# Patient Record
Sex: Male | Born: 1955
Health system: Southern US, Community
[De-identification: ages and names within clinical notes are randomized; demographics above are authoritative.]

## PROBLEM LIST (undated history)

## (undated) DIAGNOSIS — M1990 Unspecified osteoarthritis, unspecified site: Secondary | ICD-10-CM

## (undated) DIAGNOSIS — E785 Hyperlipidemia, unspecified: Secondary | ICD-10-CM

## (undated) DIAGNOSIS — I10 Essential (primary) hypertension: Secondary | ICD-10-CM

## (undated) HISTORY — DX: Hyperlipidemia, unspecified: E78.5

## (undated) HISTORY — PX: APPENDECTOMY: SHX54

## (undated) HISTORY — DX: Unspecified osteoarthritis, unspecified site: M19.90

## (undated) HISTORY — DX: Essential (primary) hypertension: I10

## (undated) HISTORY — PX: LUNG SURGERY: SHX703

## (undated) HISTORY — PX: CERVICAL DISC SURGERY: SHX588

---

## 2000-01-20 HISTORY — PX: SPINE SURGERY: SHX786

## 2004-03-18 ENCOUNTER — Ambulatory Visit (HOSPITAL_COMMUNITY): Admission: RE | Admit: 2004-03-18 | Discharge: 2004-03-18 | Payer: Self-pay | Admitting: Family Medicine

## 2004-04-02 ENCOUNTER — Ambulatory Visit (HOSPITAL_COMMUNITY): Admission: RE | Admit: 2004-04-02 | Discharge: 2004-04-03 | Payer: Self-pay | Admitting: Neurosurgery

## 2006-01-19 DIAGNOSIS — I10 Essential (primary) hypertension: Secondary | ICD-10-CM

## 2006-01-19 HISTORY — DX: Essential (primary) hypertension: I10

## 2007-04-06 ENCOUNTER — Inpatient Hospital Stay (HOSPITAL_COMMUNITY): Admission: RE | Admit: 2007-04-06 | Discharge: 2007-04-08 | Payer: Self-pay | Admitting: Neurosurgery

## 2007-11-28 ENCOUNTER — Encounter: Payer: Self-pay | Admitting: Physician Assistant

## 2008-02-07 ENCOUNTER — Inpatient Hospital Stay (HOSPITAL_COMMUNITY): Admission: RE | Admit: 2008-02-07 | Discharge: 2008-02-11 | Payer: Self-pay | Admitting: Orthopedic Surgery

## 2008-08-02 ENCOUNTER — Encounter: Payer: Self-pay | Admitting: Physician Assistant

## 2009-03-26 ENCOUNTER — Encounter: Payer: Self-pay | Admitting: Physician Assistant

## 2009-05-01 ENCOUNTER — Encounter: Admission: RE | Admit: 2009-05-01 | Discharge: 2009-05-07 | Payer: Self-pay | Admitting: Anesthesiology

## 2009-05-07 ENCOUNTER — Ambulatory Visit: Payer: Self-pay | Admitting: Anesthesiology

## 2009-08-14 ENCOUNTER — Ambulatory Visit: Payer: Self-pay | Admitting: Family Medicine

## 2009-08-14 DIAGNOSIS — M5417 Radiculopathy, lumbosacral region: Secondary | ICD-10-CM | POA: Insufficient documentation

## 2009-08-14 DIAGNOSIS — M549 Dorsalgia, unspecified: Secondary | ICD-10-CM

## 2009-08-14 DIAGNOSIS — E1165 Type 2 diabetes mellitus with hyperglycemia: Secondary | ICD-10-CM | POA: Insufficient documentation

## 2009-08-14 DIAGNOSIS — E119 Type 2 diabetes mellitus without complications: Secondary | ICD-10-CM

## 2009-08-14 DIAGNOSIS — E6609 Other obesity due to excess calories: Secondary | ICD-10-CM | POA: Insufficient documentation

## 2009-08-14 DIAGNOSIS — L259 Unspecified contact dermatitis, unspecified cause: Secondary | ICD-10-CM | POA: Insufficient documentation

## 2009-08-15 ENCOUNTER — Encounter: Payer: Self-pay | Admitting: Physician Assistant

## 2009-11-13 ENCOUNTER — Encounter: Payer: Self-pay | Admitting: Physician Assistant

## 2009-11-22 ENCOUNTER — Telehealth: Payer: Self-pay | Admitting: Family Medicine

## 2009-12-02 ENCOUNTER — Ambulatory Visit: Payer: Self-pay | Admitting: Family Medicine

## 2009-12-20 ENCOUNTER — Encounter: Payer: Self-pay | Admitting: Family Medicine

## 2009-12-27 ENCOUNTER — Telehealth: Payer: Self-pay | Admitting: Family Medicine

## 2010-02-16 LAB — CONVERTED CEMR LAB
Creatinine, Urine: 254.2 mg/dL
Microalb Creat Ratio: 11.1 mg/g (ref 0.0–30.0)
Microalb, Ur: 2.83 mg/dL — ABNORMAL HIGH (ref 0.00–1.89)

## 2010-02-18 NOTE — Progress Notes (Signed)
  Phone Note Other Incoming   Caller: dr simpson Summary of Call: no psa was done on this pt that i can find,pls check if none pls reorder and let him know, if he has on pls print andput in my box Initial call taken by: Syliva Overman MD,  December 27, 2009 6:28 AM  Follow-up for Phone Call        labs to be faxed Follow-up by: Adella Hare LPN,  December 27, 2009 3:13 PM
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
## 2010-02-18 NOTE — Progress Notes (Signed)
Summary: MEDICINE  Phone Note Call from Patient   Summary of Call: NEEDS HIS METFORMIN REFILLED [facility] EDEN HAS APPT. 11.14.11  Initial call taken by: Lind Guest,  November 22, 2009 11:17 AM  Follow-up for Phone Call        pls refill x 1 only and let him know Follow-up by: Syliva Overman MD,  November 22, 2009 12:27 PM    Prescriptions: METFORMIN HCL 1000 MG TABS (METFORMIN HCL) take 1 two times a day  #60 x 0   Entered by:   Adella Hare LPN   Authorized by:   Syliva Overman MD   Signed by:   Adella Hare LPN on 16/10/9602   Method used:   Electronically to        [facility] (retail)       [address]       [place], [place]  [address]       Ph: [phone]       Fax: [phone]   RxID:   [id number]

## 2010-02-18 NOTE — Letter (Signed)
Summary: AUTHORIZATION FOR MEDICAL RELEASE  AUTHORIZATION FOR MEDICAL RELEASE   Imported By: Lind Guest 08/15/2009 16:07:43  _____________________________________________________________________  External Attachment:    Type:   Image     Comment:   External Document

## 2010-02-18 NOTE — Letter (Signed)
Summary: ADD ON LAB  ADD ON LAB   Imported By: Lind Guest 12/20/2009 08:28:29  _____________________________________________________________________  External Attachment:    Type:   Image     Comment:   External Document
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
## 2010-02-21 NOTE — Letter (Signed)
Summary: no show / gastroenterolgy  no show / gastroenterolgy   Imported By: Lind Guest 11/13/2009 12:44:37  _____________________________________________________________________  External Attachment:    Type:   Image     Comment:   External Document

## 2010-04-02 ENCOUNTER — Ambulatory Visit: Payer: Self-pay | Admitting: Family Medicine

## 2010-04-02 ENCOUNTER — Encounter: Payer: Self-pay | Admitting: Family Medicine

## 2010-04-08 NOTE — Letter (Signed)
Summary: 1st no show  1st no show   Imported By: Lind Guest 04/03/2010 08:57:14  _____________________________________________________________________  External Attachment:    Type:   Image     Comment:   External Document

## 2010-05-01 ENCOUNTER — Other Ambulatory Visit: Payer: Self-pay | Admitting: Family Medicine

## 2010-05-05 LAB — BASIC METABOLIC PANEL
BUN: 10 mg/dL (ref 6–23)
BUN: 13 mg/dL (ref 6–23)
BUN: 5 mg/dL — ABNORMAL LOW (ref 6–23)
BUN: 6 mg/dL (ref 6–23)
CO2: 29 mEq/L (ref 19–32)
CO2: 29 mEq/L (ref 19–32)
CO2: 30 mEq/L (ref 19–32)
CO2: 32 mEq/L (ref 19–32)
Calcium: 7.8 mg/dL — ABNORMAL LOW (ref 8.4–10.5)
Calcium: 7.9 mg/dL — ABNORMAL LOW (ref 8.4–10.5)
Calcium: 8 mg/dL — ABNORMAL LOW (ref 8.4–10.5)
Calcium: 8.2 mg/dL — ABNORMAL LOW (ref 8.4–10.5)
Chloride: 101 mEq/L (ref 96–112)
Chloride: 101 mEq/L (ref 96–112)
Chloride: 103 mEq/L (ref 96–112)
Chloride: 97 mEq/L (ref 96–112)
Creatinine, Ser: 0.69 mg/dL (ref 0.4–1.5)
Creatinine, Ser: 0.7 mg/dL (ref 0.4–1.5)
Creatinine, Ser: 0.77 mg/dL (ref 0.4–1.5)
Creatinine, Ser: 0.77 mg/dL (ref 0.4–1.5)
GFR calc Af Amer: >60 mL/min (ref >60)
GFR calc Af Amer: >60 mL/min (ref >60)
GFR calc Af Amer: >60 mL/min (ref >60)
GFR calc Af Amer: >60 mL/min (ref >60)
GFR calc non Af Amer: >60 mL/min (ref >60)
GFR calc non Af Amer: >60 mL/min (ref >60)
GFR calc non Af Amer: >60 mL/min (ref >60)
GFR calc non Af Amer: >60 mL/min (ref >60)
Glucose, Bld: 130 mg/dL — ABNORMAL HIGH (ref 70–99)
Glucose, Bld: 136 mg/dL — ABNORMAL HIGH (ref 70–99)
Glucose, Bld: 154 mg/dL — ABNORMAL HIGH (ref 70–99)
Glucose, Bld: 204 mg/dL — ABNORMAL HIGH (ref 70–99)
Potassium: 3.3 mEq/L — ABNORMAL LOW (ref 3.5–5.1)
Potassium: 3.4 mEq/L — ABNORMAL LOW (ref 3.5–5.1)
Potassium: 3.4 mEq/L — ABNORMAL LOW (ref 3.5–5.1)
Potassium: 3.8 mEq/L (ref 3.5–5.1)
Sodium: 134 mEq/L — ABNORMAL LOW (ref 135–145)
Sodium: 137 mEq/L (ref 135–145)
Sodium: 137 mEq/L (ref 135–145)
Sodium: 138 mEq/L (ref 135–145)

## 2010-05-05 LAB — DIFFERENTIAL
Basophils Absolute: 0 10*3/uL (ref 0.0–0.1)
Basophils Relative: 0 % (ref 0–1)
Eosinophils Absolute: 0.1 10*3/uL (ref 0.0–0.7)
Eosinophils Relative: 1 % (ref 0–5)
Lymphocytes Relative: 16 % (ref 12–46)
Lymphs Abs: 1.3 10*3/uL (ref 0.7–4.0)
Monocytes Absolute: 0.4 10*3/uL (ref 0.1–1.0)
Monocytes Relative: 6 % (ref 3–12)
Neutro Abs: 6.3 10*3/uL (ref 1.7–7.7)
Neutrophils Relative %: 77 % (ref 43–77)

## 2010-05-05 LAB — GLUCOSE, CAPILLARY
Glucose-Capillary: 129 mg/dL — ABNORMAL HIGH (ref 70–99)
Glucose-Capillary: 137 mg/dL — ABNORMAL HIGH (ref 70–99)
Glucose-Capillary: 138 mg/dL — ABNORMAL HIGH (ref 70–99)
Glucose-Capillary: 143 mg/dL — ABNORMAL HIGH (ref 70–99)
Glucose-Capillary: 150 mg/dL — ABNORMAL HIGH (ref 70–99)
Glucose-Capillary: 151 mg/dL — ABNORMAL HIGH (ref 70–99)
Glucose-Capillary: 152 mg/dL — ABNORMAL HIGH (ref 70–99)
Glucose-Capillary: 176 mg/dL — ABNORMAL HIGH (ref 70–99)
Glucose-Capillary: 177 mg/dL — ABNORMAL HIGH (ref 70–99)
Glucose-Capillary: 178 mg/dL — ABNORMAL HIGH (ref 70–99)
Glucose-Capillary: 187 mg/dL — ABNORMAL HIGH (ref 70–99)
Glucose-Capillary: 188 mg/dL — ABNORMAL HIGH (ref 70–99)
Glucose-Capillary: 196 mg/dL — ABNORMAL HIGH (ref 70–99)
Glucose-Capillary: 199 mg/dL — ABNORMAL HIGH (ref 70–99)
Glucose-Capillary: 205 mg/dL — ABNORMAL HIGH (ref 70–99)
Glucose-Capillary: 213 mg/dL — ABNORMAL HIGH (ref 70–99)
Glucose-Capillary: 223 mg/dL — ABNORMAL HIGH (ref 70–99)
Glucose-Capillary: 286 mg/dL — ABNORMAL HIGH (ref 70–99)

## 2010-05-05 LAB — COMPREHENSIVE METABOLIC PANEL
ALT: 34 U/L (ref 0–53)
AST: 31 U/L (ref 0–37)
Albumin: 4.2 g/dL (ref 3.5–5.2)
Alkaline Phosphatase: 66 U/L (ref 39–117)
BUN: 12 mg/dL (ref 6–23)
CO2: 28 mEq/L (ref 19–32)
Calcium: 9.8 mg/dL (ref 8.4–10.5)
Chloride: 103 mEq/L (ref 96–112)
Creatinine, Ser: 0.9 mg/dL (ref 0.4–1.5)
GFR calc Af Amer: >60 mL/min (ref >60)
GFR calc non Af Amer: >60 mL/min (ref >60)
Glucose, Bld: 237 mg/dL — ABNORMAL HIGH (ref 70–99)
Potassium: 4.3 mEq/L (ref 3.5–5.1)
Sodium: 139 mEq/L (ref 135–145)
Total Bilirubin: 0.8 mg/dL (ref 0.3–1.2)
Total Protein: 7.4 g/dL (ref 6.0–8.3)

## 2010-05-05 LAB — CBC
HCT: 47.6 % (ref 39.0–52.0)
HCT: 28 % — ABNORMAL LOW (ref 39.0–52.0)
HCT: 29.7 % — ABNORMAL LOW (ref 39.0–52.0)
HCT: 30.8 % — ABNORMAL LOW (ref 39.0–52.0)
HCT: 34.2 % — ABNORMAL LOW (ref 39.0–52.0)
Hemoglobin: 16.1 g/dL (ref 13.0–17.0)
Hemoglobin: 10.1 g/dL — ABNORMAL LOW (ref 13.0–17.0)
Hemoglobin: 10.5 g/dL — ABNORMAL LOW (ref 13.0–17.0)
Hemoglobin: 11.6 g/dL — ABNORMAL LOW (ref 13.0–17.0)
Hemoglobin: 9.4 g/dL — ABNORMAL LOW (ref 13.0–17.0)
MCHC: 33.9 g/dL (ref 30.0–36.0)
MCHC: 33.7 g/dL (ref 30.0–36.0)
MCHC: 34 g/dL (ref 30.0–36.0)
MCHC: 34 g/dL (ref 30.0–36.0)
MCHC: 34 g/dL (ref 30.0–36.0)
MCV: 89 fL (ref 78.0–100.0)
MCV: 89.3 fL (ref 78.0–100.0)
MCV: 90.1 fL (ref 78.0–100.0)
MCV: 90.1 fL (ref 78.0–100.0)
MCV: 90.5 fL (ref 78.0–100.0)
Platelets: 200 10*3/uL (ref 150–400)
Platelets: 121 10*3/uL — ABNORMAL LOW (ref 150–400)
Platelets: 127 10*3/uL — ABNORMAL LOW (ref 150–400)
Platelets: 155 10*3/uL (ref 150–400)
Platelets: 168 10*3/uL (ref 150–400)
RBC: 5.35 MIL/uL (ref 4.22–5.81)
RBC: 3.09 MIL/uL — ABNORMAL LOW (ref 4.22–5.81)
RBC: 3.3 MIL/uL — ABNORMAL LOW (ref 4.22–5.81)
RBC: 3.42 MIL/uL — ABNORMAL LOW (ref 4.22–5.81)
RBC: 3.83 MIL/uL — ABNORMAL LOW (ref 4.22–5.81)
RDW: 13.4 % (ref 11.5–15.5)
RDW: 12.8 % (ref 11.5–15.5)
RDW: 13.3 % (ref 11.5–15.5)
RDW: 13.3 % (ref 11.5–15.5)
RDW: 13.4 % (ref 11.5–15.5)
WBC: 8.2 10*3/uL (ref 4.0–10.5)
WBC: 12.6 10*3/uL — ABNORMAL HIGH (ref 4.0–10.5)
WBC: 13.8 10*3/uL — ABNORMAL HIGH (ref 4.0–10.5)
WBC: 7.7 10*3/uL (ref 4.0–10.5)
WBC: 8 10*3/uL (ref 4.0–10.5)

## 2010-05-05 LAB — CULTURE, RESPIRATORY W GRAM STAIN: Culture: NORMAL

## 2010-05-05 LAB — TYPE AND SCREEN
ABO/RH(D): A POS
Antibody Screen: NEGATIVE

## 2010-05-05 LAB — URINALYSIS, ROUTINE W REFLEX MICROSCOPIC
Bilirubin Urine: NEGATIVE
Glucose, UA: >1000 mg/dL — AB
Hgb urine dipstick: NEGATIVE
Ketones, ur: NEGATIVE mg/dL
Leukocytes, UA: NEGATIVE
Nitrite: NEGATIVE
Protein, ur: NEGATIVE mg/dL
Specific Gravity, Urine: 1.03 (ref 1.005–1.030)
Urobilinogen, UA: 1 mg/dL (ref 0.0–1.0)
pH: 5.5 (ref 5.0–8.0)

## 2010-05-05 LAB — URINE MICROSCOPIC-ADD ON

## 2010-05-05 LAB — POCT I-STAT GLUCOSE
Glucose, Bld: 189 mg/dL — ABNORMAL HIGH (ref 70–99)
Glucose, Bld: 190 mg/dL — ABNORMAL HIGH (ref 70–99)
Operator id: 139621
Operator id: 139621

## 2010-05-05 LAB — PROTIME-INR
INR: 1 (ref 0.00–1.49)
Prothrombin Time: 13.8 seconds (ref 11.6–15.2)

## 2010-05-05 LAB — APTT: aPTT: 31 seconds (ref 24–37)

## 2010-05-05 LAB — VITAMIN D 25 HYDROXY (VIT D DEFICIENCY, FRACTURES): Vit D, 25-Hydroxy: 13 ng/mL — ABNORMAL LOW (ref 30–89)

## 2010-05-05 LAB — EXPECTORATED SPUTUM ASSESSMENT W GRAM STAIN, RFLX TO RESP C

## 2010-05-22 ENCOUNTER — Other Ambulatory Visit: Payer: Self-pay | Admitting: Family Medicine

## 2010-06-03 NOTE — Op Note (Signed)
NAME:  Bryan Wright, Bryan Wright NO.:  [id number]   MEDICAL RECORD NO.:  [id number]          PATIENT TYPE:  INP   LOCATION:  5012                         FACILITY:  [facility]   PHYSICIAN:  Nelda Severe, MD      DATE OF BIRTH:  1955-09-20   DATE OF PROCEDURE:  02/07/2008  DATE OF DISCHARGE:                               OPERATIVE REPORT   SURGEON:  Nelda Severe, MD   ASSISTANT:  Lianne Cure, PA-C   PREOPERATIVE DIAGNOSES:  Lumbar spondylosis, L3-4, L4-5, and L5-S1;  status post bilateral L4-5 laminectomy with mild lateral stenosis, L3-4,  L4-5, and L5-S1; right sciatic pain.   POSTOPERATIVE DIAGNOSES:  Lumbar spondylosis, L3-4, L4-5, and L5-S1;  status post bilateral L4-5 laminectomy with mild lateral stenosis, L3-4,  L4-5, and L5-S1; right sciatic pain.   OPERATIVE PROCEDURE:  Revision laminectomy, right side at L4-5, L5-S1  laminotomy and foraminotomy and partial facet joint resection, right  side; bilateral posterolateral fusion, L3-4, L4-5, and L5-S1 with local  autograft and beta-tricalcium phosphate plus bone marrow aspirate and  left posterior fusion, L3-4, L4-5, and L5-S1 facet joints with local  autograft, beta-tricalcium phosphate, and bone marrow aspirate, left L3-  4 facet fusion; instrumentation with pedicle screws and rods, L3-4, L4-  5, and L5-S1 bilaterally ([place] Spine); aspiration bone marrow from right  posterior iliac crest.   OPERATIVE NOTE:  The patient was placed under general endotracheal  anesthesia.  A Foley catheter was placed in the bladder.  Intravenous  antibiotics were infused prophylactically.  Sequential compression  devices were placed on both lower extremities.  The patient was  positioned prone on a Jackson frame.  Care was taken to position the  upper extremities so as to avoid hyperflexion and abduction of the  shoulders so as to avoid hyperflexion of the elbows.  Both upper  extremities were padded with foam from axilla to hands.   The thighs,  knees, shins, and ankles were supported on pillows.  Hair was clipped  from the lumbar spine.  The previous midline incision was marked with a  skin marker.  Lumbar area was prepped using DuraPrep and draped in a  rectangular fashion.  The drapes were secured with Ioban.   The skin was scored in an elliptical fashion around the previous  incision and a vertical incision created proximally and distally to the  previous scar.  Subcutaneous tissue and paraspinal muscles and fascia  were injected with a mixture of 0.25% plain Marcaine and 1% lidocaine  with epinephrine.  Dissection was then carried down with the cutting  current and the previous scar excised.  There was a moderately thick  adipose layer.  The spinous process of the distal lumbosacral spine was  identified, as was the previously resected L4 spinous process.  A cross-  table lateral radiograph was taken with a Kocher clamp on the spinous  process of L3 and L5, hence confirming our impression of last mobile  segment at L5-S1.  We then continued exposure to reflect paraspinal  muscles bilaterally to expose the tips of the L3, L4, and L5 transverse  processes  as well as the ala of the sacrum bilaterally.   At this point, we placed pedicle screw holes, first on the left at L5  through L3 and then on the right at L5 through L3.  This was done the  same manner at each level, identifying the base of the superior  articular process, removing a small amount with a Leksell rongeur,  identifying the posterior pedicle, perforating the pedicle with an awl,  using a pedicle probe to create a hole through the pedicle into the  vertebral body, sounding the hole with a ball-tip probe to make sure it  was circumferentially intact and recording the depths.  Each hole was  injected with FloSeal and then a radiopaque marker placed.  Subsequent  cross-table lateral radiograph revealed satisfactory position of  markers.   We then set  about harvesting local bone graft.  A medium gouge was used  to remove shavings of bone from the lamina and particularly inferior  articular processes at L3, L4, and L5 bilaterally.  This revealed a  reasonable amount of graft which was morcellized by the OR staff.  An 18-  gauge needle was placed in the right posterior iliac crest and  approximately 15 mL of bone marrow aspirated and mixed with 10 mL of  beta-tricalcium phosphate and the autograft which had been harvested.  We then began the process of revision laminectomy on the right side at  L4-5.  The trailing edge of the right L4 lamina was curetted free of  laminectomy membrane.  The lamina had already been thinned out by the  autograft harvesting process.  We used Kerrison rongeurs to remove the  remaining inferior articular process as well as most of the lamina of L4  proximally.  We then used a Kerrison rongeur to remove the remaining  superior articular process of L5.  This adequately decompressed the  neural foramen.  I was able to easily palpate the L4 nerve root exiting  and to identify it, although I did not dissected free.  There appeared  to be some lateral stenosis medial to the L5 pedicle impinging the L5  nerve root.  We then proceeded to do a lateral recess decompression from  proximally to a point about mid pedicle.  I then performed a laminotomy  at L5-S1 on the right side.  This was extended laterally to include a  partial facetectomy and to be able to visualize and palpate the L5 nerve  root exiting in the neural foramen.  It could be palpated, free from  pressure medial to the pedicle.  A very small bridge of L5 lamina  remained on the right side between the revision laminectomy at L4-5  above, and the laminotomy and foraminotomy at L5-S1 below.   Next, we decorticated the ala of sacrum, transverse processes, and  lateral aspect of the superior articular processes in the area to be  fused on the right, L3 through  S1.  The graft/beta-tricalcium  phosphate/bone marrow aspirate mixture was placed posterolaterally being  careful to press it into the areas that had been decorticated digitally.  We then placed screws starting distally at S1 and working proximally to  L3.  The S1 pedicle screw was 7.2 mm in diameter and the other screw 6.5  mm in diameter.  Screw lengths were checked at the time of insertion by  inserting a ball-tip probe and measuring the depth again.  We then  contoured the titanium rod and provisionally coupled it from  S1 through  L3 on the right side.   On the left side, we proceeded to do exactly the same thing,  decorticating the ala of the sacrum, the transverse processes, and  lateral aspects of the superior articular processes of the vertebrae to  be fused.  More graft was placed posterolaterally.  Screws were then  placed and checked for length in the same fashion as described for the  right side.  A titanium rod was contoured and provisionally fixed.  Cross-table lateral radiograph showed satisfactory position of the  screws and rods.  We then compressed the constructs segmentally and  torqued all the screws.  I then used a high-speed bur to further resect  the facet joints on the left side at L3-4, L4-5, and L5-S1.  More graft  was packed into the facet joints.  The right-sided L3-4 facet joint was  also further decorticated and graft packed.   On two occasions during the procedure while awaiting for x-rays,  antibiotic solution was placed in the wound and allowed to sit for  several minutes while the x-ray was being taken to process.   We placed a 15-gauge Blake drain subfascially and brought out through  the skin to the right side and secured with a basket weave nylon suture.  The thoracolumbar fascia was then closed using a continuous #1 Vicryl  suture.  The subcutaneous layer was drained with an eighth-inch Hemovac  drain also brought out through the skin to the right  side and secured  with a 2-0 nylon suture.  The subcutaneous layer was closed using  interrupted 2-0 undyed Vicryl in an inverted fashion.  The skin was  closed using a subcuticular 3-0 undyed Vicryl in continuous fashion.  Skin edges were reinforced with Steri-Strips.  Antibiotic ointment  dressing was applied and secured with OpSite.   The blood loss estimated at 600 mL.  The patient received 350 mL of Cell  Saver blood.   The sponge and needle counts were correct.  There were no intraoperative  complications.   At the time of dictation, the patient is being awakened up and has not  yet been examined in the recovery room.      Nelda Severe, MD  Electronically Signed     MT/MEDQ  D:  02/07/2008  T:  02/08/2008  Job:  [id number]

## 2010-06-03 NOTE — Discharge Summary (Signed)
NAME:  KYHEEM, BATHGATE NO.:  [id number]   MEDICAL RECORD NO.:  [id number]          PATIENT TYPE:  INP   LOCATION:  5021                         FACILITY:  [facility]   PHYSICIAN:  Nelda Severe, MD      DATE OF BIRTH:  1956-01-14   DATE OF ADMISSION:  04/06/2007  DATE OF DISCHARGE:  04/08/2007                               DISCHARGE SUMMARY   This 55 year old gentleman was admitted for management of right leg pain  secondary to L4-5 spinal stenosis and disk degeneration.  On the day of  admission, he was taken to the operating room where a bilateral  laminectomy and medial facetectomy at L4-5 was carried out.  He was  decompressed from pedicle of L4 and the pedicle of L5, total of 4 nerve  roots,  bilateral L4 and bilateral L5.   Postoperatively, his course has been uncomplicated.  He has been able to  ambulate with physical therapy with the use of a walker.  His leg pain  is much improved over to preoperative status.  At the present time, he  is ambulatory, can tolerate regular diet and is passing urine without  difficulty.  He is ambulatory with a walker.  His wound is stable.   FINAL DIAGNOSIS:  L4-5 spinal stenosis.   DISPOSITION:  Home with walker.   FOLLOWUP:  Two weeks in the office, to call me if there is any  persistent wound drainage or fever, and I will see him sooner.   DISCHARGE MEDICATIONS:  He was given prescription for Norco 10 1-2 q.4  h. p.r.n. maximum 6 per day 50 tablets and Robaxin 500 mg 1 q.6 h.  p.r.n. for spasm 50 tablets.   CONDITION ON DISCHARGE:  Improved right leg pain, wound stable.      Nelda Severe, MD  Electronically Signed     MT/MEDQ  D:  04/08/2007  T:  04/09/2007  Job:  [id number]

## 2010-06-03 NOTE — Discharge Summary (Signed)
NAME:  Bryan Wright, IRION NO.:  [id number]   MEDICAL RECORD NO.:  [id number]          PATIENT TYPE:  INP   LOCATION:  5012                         FACILITY:  [facility]   PHYSICIAN:  Nelda Severe, MD      DATE OF BIRTH:  05/08/1955   DATE OF ADMISSION:  02/07/2008  DATE OF DISCHARGE:  02/11/2008                               DISCHARGE SUMMARY   FINAL DIAGNOSES:  Lumbar spondylosis, status post L4-5 laminectomy and  disk excision.   The patient was admitted to [facility] and taken to the  operating room the day of admission where a right L4-5 and L5-S1  laminotomy/laminectomy was carried out as well as posterior and  posterolateral fusion from L3-4 through L5-S1 using pedicle screws,  rods, local autogenous bone graft, and beta-tricalcium phosphate with  bone marrow aspirate.   Postoperatively, his course has been uncomplicated, except for the fact  that his drainage through a Blake drain and Hemovac drain continued for  a fairly prolonged period of time.  Today, the day of admission, both  drains have been removed.  Also, he has developed a productive cough,  although he is afebrile and his chest is clear.  The sputum is of light  yellowish color.   Additionally, his vitamin D level was done at the time of his admission  and his 25-hydroxy vitamin D is 13 with a low normal of 30.   FINAL DIAGNOSES:  1. Lumbar spondylosis/post laminectomy syndrome.  2. Vitamin D deficiency.  3. Probable bronchitis.   He is being discharged with his prescription for Norco 5 one q.6 h.  p.r.n. maximum six per day, 100 tablets which can be refilled twice.  He  has been given a prescription for Augmentin 875 mg one b.i.d. for 7  days.  He is being given a prescription for vitamin D 50,000 units one  daily for the next 4 days, followed by one weekly for the next 4 weeks,  and one monthly for 8 months after that.  He is also been advised to  chew two Extra Strength Tums per day  and swallow them.  He is going to  have a sputum culture prior to his leaving the hospital.  I have told  them that if his productive cough has not improved significantly in the  next 2-3 days, he should see his family doctor who is in [place], [place].  I would like him to come back and see me in one month's time  in the office.  If he has any drainage or fever, he is to get in touch  with me.   He has his durable medical equipment at home, walker and a raised toilet  seat.  If he has any problems whatsoever, he is to call me.   His condition on discharge, wound stable, drains removed, ambulatory.      Nelda Severe, MD  Electronically Signed     MT/MEDQ  D:  02/11/2008  T:  02/11/2008  Job:  [id number]

## 2010-06-03 NOTE — Op Note (Signed)
NAME:  Bryan Wright, Bryan Wright NO.:  [id number]   MEDICAL RECORD NO.:  [id number]          PATIENT TYPE:  OIB   LOCATION:  5021                         FACILITY:  [facility]   PHYSICIAN:  Nelda Severe, MD      DATE OF BIRTH:  1955-10-18   DATE OF PROCEDURE:  04/06/2007  DATE OF DISCHARGE:                               OPERATIVE REPORT   SURGEON:  Nelda Severe, M.D.   ASSISTANT:  Lianne Cure, P.A.-C.   PREOPERATIVE DIAGNOSIS:  Lumbar disc degeneration L4-L5 with spinal  stenosis.   POSTOPERATIVE DIAGNOSIS:  Lumbar disc degeneration L4-L5 with spinal  stenosis.   OPERATIVE PROCEDURE:  L4 and L5 bilateral laminectomies and partial  facetectomies, decompression from the pedicle of L4 to the pedicle of L5  - bilateral L4-L5 nerve roots.   OPERATIVE NOTE:  The patient was administered intravenous prophylactic  antibiotics.  A Foley catheter was placed in the bladder.  Sequential  compression devices were placed on both lower extremities.  He was  positioned prone on the [place] frame.  Care was taken to position his  upper extremities so as to avoid hyperflexion and abduction of the  shoulders and so as to avoid hyperflexion of the elbows.  The upper  extremities were padded from axilla to hands with foam.  Extra foam was  used to pad along the side rails of the Drain frame because his  abdomen was so protuberant that it was contacting the side rails.  His  hips and knees were gently flexed and the thighs, knees, shins and  ankles supported on pillows.  Hair was clipped from his lumbar area.  A  skin marker was used to outline the proposed incision and to mark what  is clinically likely his L4-L5 level.  The lumbar area was prepped with  DuraPrep and draped in a rectangular fashion.  The drapes were secured  with Ioban.   The skin was scored in line with the incision and subcutaneous tissue  injected with a mixture of 0.25% plain Marcaine and 1% lidocaine with  epinephrine.  Dissection was then carried down through a thick adipose  layer to the spinous processes of the lower lumbar area.  Cross table  lateral radiograph was taken with a Kocher on the spinous process of L4  which defined our level.  The paraspinal muscles were reflected  bilaterally beyond the facet joints at L4-L5 from L3-L4 to L5-S1.  A  McCullough retractor was placed.  The spinous process of L4 and  interspinous ligament at L4-L5 and L3-L4 were removed with a Leksell  rongeur.  The trailing edge of the lamina of L4 was defined with an  angled curette as well as the medial edge of the inferior articular  process of L4 bilaterally.  The insertion of the ligamentum flavum into  the upper edge of the L5 lamina was detached using a curette.   We then used a high speed bur to remove the central portion of the  lamina over the underlying ligamentum flavum and to perform medial  facetectomies of the inferior articular process on either side.  The  lamina proximal to the origin of the ligamentum flavum was thinned using  the high speed bur.  The laminectomy at L4 was then completed using  Kerrison rongeurs, using a technique of interposing cottonoid patty  between dura and overlying bone to avoid damage to the dura.  We carried  the laminectomy proximally to the level of the L4 pedicle.  We then  removed ligamentum flavum distally to the upper edge of the L5 lamina.  The lateral recess, medial to the L4 pedicle, was decompressed on both  sides and the laminectomy at L5 extended into the lamina of L5 with  removal of part of the spinous process to a point in the mid portion of  the pedicle of L5 bilaterally.  Both L5 nerve roots and both L4 nerve  roots were identified, palpated, and further foraminal decompression  carried out, particularly on the right side at L4-L5, to adequately  compress the L4 nerve root on the right side.  On the right side, the  dura was mobilized over the  L4-L5 disc and there is no disc fragment  apparent.   We then the smeared the laminectomy edges with bone wax.  An 18 gauge  Blake drain was placed subfascially.  The thoracolumbar fascia was  closed using continuous #1 Vicryl suture.  A 1/8 inch Hemovac drain was  placed in the subcutaneous layer and the subcutaneous layer closed using  interrupted 2-0 Vicryl sutures.  The skin was closed with a 3-0 undyed  Vicryl suture in running subcuticular fashion.  The skin edges were  reinforced with Steri-Strips.  An antibiotic ointment dressing was  applied and secured with OpSite.   Sponge and needle counts were correct.  There were no intraoperative  complications.  Estimated blood loss was about 200 cc's.      Nelda Severe, MD  Electronically Signed     MT/MEDQ  D:  04/06/2007  T:  04/06/2007  Job:  [id number]

## 2010-06-06 NOTE — Op Note (Signed)
NAMEJALYN, Bryan Wright NO.:  [id number]   MEDICAL RECORD NO.:  [id number]          PATIENT TYPE:  OIB   LOCATION:  2899                         FACILITY:  [facility]   PHYSICIAN:  Hilda Lias, M.D.   DATE OF BIRTH:  [birth year]   DATE OF PROCEDURE:  04/02/2004  DATE OF DISCHARGE:                                 OPERATIVE REPORT   PREOPERATIVE DIAGNOSIS:  C5-6 herniated disk with a right C6 radiculopathy.   POSTOPERATIVE DIAGNOSIS:  C5-6 herniated disk with a right C6 radiculopathy.   PROCEDURE:  Anterior C5-6 diskectomy, decompression of spinal cord,  bilateral foraminotomies, interbody fusion with allograft plate, microscope.   SURGEON:  Hilda Lias, M.D.   ASSISTANT:  Payton Doughty, M.D.   INDICATIONS:  The patient was admitted because of neck and right upper  extremity pain.  The patient has failed conservative treatment.  MRI showed  herniated disk central to the right.  Surgery was advised.  The risks were  explained in the history and physical.   DESCRIPTION OF PROCEDURE:  The patient was taken to the OR.  After  intubation, the neck was prepped with Betadine.  The patient has a large  neck and we had to use a small roll between the shoulder blades.  A  transverse incision was made through the skin and subcutaneous tissue down  to the cervical spine. We inserted 2 needles in the spine.  The upper one  was at the level of C4-5, the lower one was at the level of C5-6.  From  thereon with the microscope, we opened the anterior ligament.  A slight  amount of degenerative disk was removed.  Then we found the posterior  ligament.  It was opened medial and to the right.  Incision of the posterior  ligament was achieved and at least 5-6 foraminal disks were removed from the  right side.  The we did bilateral foraminotomies.  The endplates were  drilled.  This was followed by allograft of 7 mm height with DBX in between.  There was plenty of space posterior  between the allograft and the stomach.  Having done this, a plate using 4 screws was inserted.  We had to redirect  around the screw, which was slightly going to the disk space.  Then, a  second x-ray showed good position of the screws.  We investigated the  esophagus, carotid, and trachea, and they were normal.  Irrigation was done.  Bipolar was used for hemostasis.  From thereon, the area was closed with  Vicryl and Steri-Strips.  The patient did well.      EB/MEDQ  D:  04/02/2004  T:  04/02/2004  Job:  [id number]

## 2010-06-26 ENCOUNTER — Other Ambulatory Visit: Payer: Self-pay | Admitting: Family Medicine

## 2010-07-15 ENCOUNTER — Encounter: Payer: Self-pay | Admitting: Family Medicine

## 2010-07-16 ENCOUNTER — Ambulatory Visit: Payer: Self-pay | Admitting: Family Medicine

## 2010-08-25 ENCOUNTER — Encounter: Payer: Self-pay | Admitting: Family Medicine

## 2010-08-26 ENCOUNTER — Encounter: Payer: Self-pay | Admitting: Family Medicine

## 2010-08-26 ENCOUNTER — Ambulatory Visit (INDEPENDENT_AMBULATORY_CARE_PROVIDER_SITE_OTHER): Payer: Self-pay | Admitting: Family Medicine

## 2010-08-26 VITALS — BP 140/84 | HR 91 | Resp 16 | Ht 70.5 in | Wt 273.0 lb

## 2010-08-26 DIAGNOSIS — E669 Obesity, unspecified: Secondary | ICD-10-CM

## 2010-08-26 DIAGNOSIS — M549 Dorsalgia, unspecified: Secondary | ICD-10-CM

## 2010-08-26 DIAGNOSIS — E119 Type 2 diabetes mellitus without complications: Secondary | ICD-10-CM

## 2010-08-26 DIAGNOSIS — Z13220 Encounter for screening for lipoid disorders: Secondary | ICD-10-CM

## 2010-08-26 MED ORDER — GLIPIZIDE ER 5 MG PO TB24: ORAL_TABLET | ORAL | Status: DC

## 2010-08-26 MED ORDER — METFORMIN HCL 1000 MG PO TABS: ORAL_TABLET | ORAL | Status: DC

## 2010-08-26 NOTE — Patient Instructions (Signed)
F/u in 6 months.  Pls get fasting labs asap  Lipid, chem 7, hBA1C     hBa1c   Non fasting in 6 months  LABWORK  NEEDS TO BE DONE BETWEEN 3 TO 7 DAYS BEFORE YOUR NEXT SCEDULED  VISIT.  THIS WILL IMPROVE THE QUALITY OF YOUR CARE.
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
## 2010-08-27 NOTE — Assessment & Plan Note (Signed)
Improved, with implantation of stimulator

## 2010-08-27 NOTE — Assessment & Plan Note (Signed)
Pt needs HBA1c to establish level of control, hew understands, and will have this done soon

## 2010-08-27 NOTE — Assessment & Plan Note (Signed)
Deteriorated. Patient re-educated about  the importance of commitment to a  minimum of 150 minutes of exercise per week. The importance of healthy food choices with portion control discussed. Encouraged to start a food diary, count calories and to consider  joining a support group. Sample diet sheets offered. Goals set by the patient for the next several months.    

## 2010-10-13 LAB — CBC
HCT: 44.6
HCT: 34.7 — ABNORMAL LOW
HCT: 37.9 — ABNORMAL LOW
Hemoglobin: 15.3
Hemoglobin: 12 — ABNORMAL LOW
Hemoglobin: 13
MCHC: 34.2
MCHC: 34.3
MCHC: 34.6
MCV: 89.3
MCV: 88.9
MCV: 89.9
Platelets: 210
Platelets: 165
Platelets: 210
RBC: 5
RBC: 3.9 — ABNORMAL LOW
RBC: 4.22
RDW: 13
RDW: 12.9
RDW: 12.9
WBC: 8.9
WBC: 11.1 — ABNORMAL HIGH
WBC: 13.8 — ABNORMAL HIGH

## 2010-10-13 LAB — BASIC METABOLIC PANEL
BUN: 11
BUN: 18
CO2: 24
CO2: 28
Calcium: 8.4
Calcium: 8.7
Chloride: 103
Chloride: 99
Creatinine, Ser: 0.81
Creatinine, Ser: 0.84
GFR calc Af Amer: >60
GFR calc Af Amer: >60
GFR calc non Af Amer: >60
GFR calc non Af Amer: >60
Glucose, Bld: 137 — ABNORMAL HIGH
Glucose, Bld: 262 — ABNORMAL HIGH
Potassium: 3.8
Potassium: 4.5
Sodium: 132 — ABNORMAL LOW
Sodium: 138

## 2010-10-13 LAB — COMPREHENSIVE METABOLIC PANEL
ALT: 32
AST: 35
Albumin: 4.3
Alkaline Phosphatase: 68
BUN: 12
CO2: 30
Calcium: 9.9
Chloride: 102
Creatinine, Ser: 0.7
GFR calc Af Amer: >60
GFR calc non Af Amer: >60
Glucose, Bld: 188 — ABNORMAL HIGH
Potassium: 4.2
Sodium: 138
Total Bilirubin: 0.8
Total Protein: 7.3

## 2010-10-13 LAB — DIFFERENTIAL
Basophils Absolute: 0
Basophils Relative: 0
Eosinophils Absolute: 0.2
Eosinophils Relative: 2
Lymphocytes Relative: 20
Lymphs Abs: 1.7
Monocytes Absolute: 0.5
Monocytes Relative: 6
Neutro Abs: 6.4
Neutrophils Relative %: 73

## 2010-10-13 LAB — URINALYSIS, ROUTINE W REFLEX MICROSCOPIC
Bilirubin Urine: NEGATIVE
Glucose, UA: >1000 — AB
Hgb urine dipstick: NEGATIVE
Ketones, ur: NEGATIVE
Leukocytes, UA: NEGATIVE
Nitrite: NEGATIVE
Protein, ur: NEGATIVE
Specific Gravity, Urine: 1.034 — ABNORMAL HIGH
Urobilinogen, UA: 0.2
pH: 5

## 2010-10-13 LAB — URINE MICROSCOPIC-ADD ON

## 2010-10-13 LAB — TYPE AND SCREEN
ABO/RH(D): A POS
Antibody Screen: NEGATIVE

## 2010-10-13 LAB — APTT: aPTT: 34

## 2010-10-13 LAB — URINE CULTURE: Colony Count: 2000

## 2010-10-13 LAB — HEMOGLOBIN A1C
Hgb A1c MFr Bld: 8.8 — ABNORMAL HIGH
Mean Plasma Glucose: 236

## 2010-10-13 LAB — PROTIME-INR
INR: 1
Prothrombin Time: 13.4

## 2010-10-13 LAB — ABO/RH: ABO/RH(D): A POS

## 2011-02-26 ENCOUNTER — Encounter: Payer: Self-pay | Admitting: Family Medicine

## 2011-02-26 ENCOUNTER — Ambulatory Visit: Payer: Self-pay | Admitting: Family Medicine

## 2011-06-16 ENCOUNTER — Other Ambulatory Visit: Payer: Self-pay | Admitting: Family Medicine

## 2012-03-22 DIAGNOSIS — M47816 Spondylosis without myelopathy or radiculopathy, lumbar region: Secondary | ICD-10-CM | POA: Insufficient documentation

## 2012-03-22 DIAGNOSIS — M961 Postlaminectomy syndrome, not elsewhere classified: Secondary | ICD-10-CM | POA: Insufficient documentation

## 2012-08-04 ENCOUNTER — Encounter: Payer: Self-pay | Admitting: Family Medicine

## 2012-08-04 ENCOUNTER — Ambulatory Visit (INDEPENDENT_AMBULATORY_CARE_PROVIDER_SITE_OTHER): Payer: Medicare Other | Admitting: Family Medicine

## 2012-08-04 VITALS — BP 140/80 | HR 72 | Resp 16 | Ht 70.5 in | Wt 258.0 lb

## 2012-08-04 DIAGNOSIS — R05 Cough: Secondary | ICD-10-CM

## 2012-08-04 DIAGNOSIS — R053 Chronic cough: Secondary | ICD-10-CM

## 2012-08-04 DIAGNOSIS — E785 Hyperlipidemia, unspecified: Secondary | ICD-10-CM

## 2012-08-04 DIAGNOSIS — Z1211 Encounter for screening for malignant neoplasm of colon: Secondary | ICD-10-CM

## 2012-08-04 DIAGNOSIS — R059 Cough, unspecified: Secondary | ICD-10-CM

## 2012-08-04 DIAGNOSIS — IMO0001 Reserved for inherently not codable concepts without codable children: Secondary | ICD-10-CM

## 2012-08-04 DIAGNOSIS — Z125 Encounter for screening for malignant neoplasm of prostate: Secondary | ICD-10-CM

## 2012-08-04 DIAGNOSIS — E669 Obesity, unspecified: Secondary | ICD-10-CM

## 2012-08-04 DIAGNOSIS — M549 Dorsalgia, unspecified: Secondary | ICD-10-CM

## 2012-08-04 DIAGNOSIS — E119 Type 2 diabetes mellitus without complications: Secondary | ICD-10-CM

## 2012-08-04 MED ORDER — ASPIRIN EC 81 MG PO TBEC: 81.0000 mg | DELAYED_RELEASE_TABLET | Freq: Every day | ORAL | Status: AC

## 2012-08-04 MED ORDER — BENAZEPRIL HCL 10 MG PO TABS: 10.0000 mg | ORAL_TABLET | Freq: Every day | ORAL | Status: DC

## 2012-08-04 NOTE — Patient Instructions (Signed)
Welcome to medicare first week in October, call if you need me before  Fasting lipid, cmp and EGFR, Microalb, TSH, PSA, HBA1C, CBc in the morning please  You will be contacted about dose of medication you need for your diabetes, also re the medication for toenail fungus , as we discussed.  You are referred to Dr Nile Riggs for an eye exam, and to [facility] for screening colonoscopy. Please get a CXR at the hospital today for chronic cough  NEW is aspirin 1 daily for heart protection , and lotensin 1 daily for blood pressure and kidney protection  It is important that you exercise regularly at least 30 minutes 5 times a week. If you develop chest pain, have severe difficulty breathing, or feel very tired, stop exercising immediately and seek medical attention    With weight losss, you may be able to cure yourself of diabetes!

## 2012-08-05 LAB — CBC
HCT: 43.8 % (ref 39.0–52.0)
Hemoglobin: 15.1 g/dL (ref 13.0–17.0)
MCH: 30.4 pg (ref 26.0–34.0)
MCHC: 34.5 g/dL (ref 30.0–36.0)
MCV: 88.3 fL (ref 78.0–100.0)
Platelets: 213 10*3/uL (ref 150–400)
RBC: 4.96 MIL/uL (ref 4.22–5.81)
RDW: 13.2 % (ref 11.5–15.5)
WBC: 7.4 10*3/uL (ref 4.0–10.5)

## 2012-08-05 LAB — COMPLETE METABOLIC PANEL WITH GFR
ALT: 21 U/L (ref 0–53)
AST: 26 U/L (ref 0–37)
Albumin: 4.4 g/dL (ref 3.5–5.2)
Alkaline Phosphatase: 68 U/L (ref 39–117)
BUN: 11 mg/dL (ref 6–23)
CO2: 31 mEq/L (ref 19–32)
Calcium: 9.4 mg/dL (ref 8.4–10.5)
Chloride: 99 mEq/L (ref 96–112)
Creat: 0.74 mg/dL (ref 0.50–1.35)
GFR, Est African American: >89 mL/min
GFR, Est Non African American: >89 mL/min
Glucose, Bld: 214 mg/dL — ABNORMAL HIGH (ref 70–99)
Potassium: 4.4 mEq/L (ref 3.5–5.3)
Sodium: 136 mEq/L (ref 135–145)
Total Bilirubin: 0.7 mg/dL (ref 0.3–1.2)
Total Protein: 7.3 g/dL (ref 6.0–8.3)

## 2012-08-05 LAB — LIPID PANEL
Cholesterol: 181 mg/dL (ref 0–200)
HDL: 46 mg/dL (ref >39)
LDL Cholesterol: 112 mg/dL — ABNORMAL HIGH (ref 0–99)
Total CHOL/HDL Ratio: 3.9 Ratio
Triglycerides: 117 mg/dL (ref <150)
VLDL: 23 mg/dL (ref 0–40)

## 2012-08-05 LAB — HEMOGLOBIN A1C
Hgb A1c MFr Bld: 8.7 % — ABNORMAL HIGH (ref <5.7)
Mean Plasma Glucose: 203 mg/dL — ABNORMAL HIGH (ref <117)

## 2012-08-06 DIAGNOSIS — E785 Hyperlipidemia, unspecified: Secondary | ICD-10-CM | POA: Insufficient documentation

## 2012-08-06 DIAGNOSIS — E782 Mixed hyperlipidemia: Secondary | ICD-10-CM | POA: Insufficient documentation

## 2012-08-06 LAB — MICROALBUMIN / CREATININE URINE RATIO
Creatinine, Urine: 144.2 mg/dL
Microalb Creat Ratio: 9.9 mg/g (ref 0.0–30.0)
Microalb, Ur: 1.43 mg/dL (ref 0.00–1.89)

## 2012-08-06 LAB — PSA, MEDICARE: PSA: 2.34 ng/mL (ref <=4.00)

## 2012-08-06 LAB — TSH: TSH: 0.634 u[IU]/mL (ref 0.350–4.500)
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
## 2012-08-06 NOTE — Assessment & Plan Note (Signed)
Failed back surgery, medication through pain clinic

## 2012-08-06 NOTE — Assessment & Plan Note (Signed)
Improved. Pt applauded on succesful weight loss through lifestyle change, and encouraged to continue same. Weight loss goal set for the next several months.  

## 2012-08-06 NOTE — Progress Notes (Signed)
  Subjective:    Patient ID: Bryan Wright, male    DOB: [birth year], 57 y.o.   MRN: [id number]  HPI The PT is here to re establish care and for re-evaluation of chronic medical conditions, medication management and review of any available recent lab and radiology data.  Preventive health is updated, specifically  Cancer screening and Immunization.   He has only consistently een taking his chronic pain medication for back poain following failed back surgery, he gets these from a pain clinic in [facility] States he has not been testing sugar regularly, occasionally was taking some of his wife's med when he felt his sugar was high.Denies polyuria ,polydipsia or blurred vsion Recently got health coverage so is now able to care for his health      Review of Systems See HPI Denies recent fever or chills. Denies sinus pressure, nasal congestion, ear pain or sore throat. Chronic cough, for years, no sputum , no wheeze Denies abdominal pain, nausea, vomiting,diarrhea or constipation.   Denies dysuria, frequency, hesitancy or incontinence. Chronic back pain Denies headaches, seizures, numbness, or tingling. Denies depression, anxiety or insomnia. Denies skin break down or rash.        Objective:   Physical Exam Patient alert and oriented and in no cardiopulmonary distress.  HEENT: No facial asymmetry, EOMI, no sinus tenderness,  oropharynx pink and moist.  Neck supple no adenopathy.  Chest: Decreased air entry, no wheeze, few crackles  CVS: S1, S2 no murmurs, no S3.  ABD: Soft non tender. Bowel sounds normal.  Ext: No edema  WU:[id number] ROM spine, adequate i shoulders, hips and knees.  Skin: Intact, no ulcerations or rash noted.  Psych: Good eye contact, normal affect. Memory intact not anxious or depressed appearing.  CNS: CN 2-12 intact, power, tone and sensation normal throughout.        Assessment & Plan:

## 2012-08-06 NOTE — Assessment & Plan Note (Signed)
Elevated LDL Hyperlipidemia:Low fat diet discussed and encouraged.   

## 2012-08-10 ENCOUNTER — Other Ambulatory Visit: Payer: Self-pay

## 2012-08-10 DIAGNOSIS — E785 Hyperlipidemia, unspecified: Secondary | ICD-10-CM

## 2012-08-10 DIAGNOSIS — E119 Type 2 diabetes mellitus without complications: Secondary | ICD-10-CM

## 2012-08-10 MED ORDER — PRAVASTATIN SODIUM 20 MG PO TABS: 20.0000 mg | ORAL_TABLET | Freq: Every evening | ORAL | Status: DC

## 2012-08-10 MED ORDER — SITAGLIPTIN PHOS-METFORMIN HCL 50-1000 MG PO TABS
1.0000 | ORAL_TABLET | Freq: Two times a day (BID) | ORAL | Status: DC
Start: 2012-08-10 — End: 2012-08-11

## 2012-08-10 MED ORDER — GLIPIZIDE ER 5 MG PO TB24: 5.0000 mg | ORAL_TABLET | Freq: Every day | ORAL | Status: DC

## 2012-08-11 ENCOUNTER — Other Ambulatory Visit: Payer: Self-pay | Admitting: Family Medicine

## 2012-08-11 ENCOUNTER — Other Ambulatory Visit: Payer: Self-pay

## 2012-08-11 DIAGNOSIS — E119 Type 2 diabetes mellitus without complications: Secondary | ICD-10-CM

## 2012-08-11 MED ORDER — TERBINAFINE HCL 250 MG PO TABS: 250.0000 mg | ORAL_TABLET | Freq: Every day | ORAL | Status: DC

## 2012-08-11 MED ORDER — SITAGLIPTIN PHOS-METFORMIN HCL 50-1000 MG PO TABS: 1.0000 | ORAL_TABLET | Freq: Two times a day (BID) | ORAL | Status: DC

## 2012-08-12 ENCOUNTER — Telehealth: Payer: Self-pay | Admitting: Family Medicine

## 2012-08-12 NOTE — Telephone Encounter (Signed)
Please call cell number [phone]

## 2012-08-15 ENCOUNTER — Other Ambulatory Visit: Payer: Self-pay | Admitting: Family Medicine

## 2012-08-15 MED ORDER — METFORMIN HCL 1000 MG PO TABS: 1000.0000 mg | ORAL_TABLET | Freq: Two times a day (BID) | ORAL | Status: DC

## 2012-08-15 NOTE — Telephone Encounter (Signed)
Patient aware and med faxed 

## 2012-08-15 NOTE — Telephone Encounter (Signed)
Medication changed per pt preference. Plds fax and let him know, needs to be diligent with low carb , low sugar diet

## 2012-08-15 NOTE — Telephone Encounter (Signed)
Patient states that he does not want to take Janumet and would rather take Metformin.  Please advise.

## 2012-08-16 ENCOUNTER — Telehealth: Payer: Self-pay

## 2012-08-18 ENCOUNTER — Telehealth: Payer: Self-pay

## 2012-08-18 NOTE — Telephone Encounter (Signed)
Pt was referred for screening colonoscopy by Dr. Lodema Hong. I spoke to pt's wife, she said that pt told Dr. Lodema Hong that he would find his own GI doctor, that he did not want to come here in [place]. I told her I would let Dr. Lodema Hong know.

## 2012-08-19 NOTE — Telephone Encounter (Signed)
See note of 08/18/2012.

## 2012-08-19 NOTE — Telephone Encounter (Signed)
Noted, sorry about that, infortunately, that wass nopt what I "heard" at the visit. Sorry,for inconvenience

## 2012-11-02 ENCOUNTER — Encounter: Payer: Medicare Other | Admitting: Family Medicine

## 2012-11-19 ENCOUNTER — Other Ambulatory Visit: Payer: Self-pay | Admitting: Family Medicine

## 2012-12-20 ENCOUNTER — Telehealth: Payer: Self-pay | Admitting: Family Medicine

## 2012-12-20 MED ORDER — BENAZEPRIL HCL 10 MG PO TABS: ORAL_TABLET | ORAL | Status: DC

## 2012-12-20 NOTE — Telephone Encounter (Signed)
1 refill only given. Note to pharmacy that pt needs appt before anymore refills

## 2013-01-16 ENCOUNTER — Other Ambulatory Visit: Payer: Self-pay | Admitting: Family Medicine

## 2013-02-08 ENCOUNTER — Telehealth: Payer: Self-pay | Admitting: Family Medicine

## 2013-02-08 NOTE — Telephone Encounter (Signed)
Noted  

## 2013-02-09 ENCOUNTER — Other Ambulatory Visit: Payer: Self-pay | Admitting: Family Medicine

## 2013-02-14 ENCOUNTER — Telehealth: Payer: Self-pay | Admitting: Family Medicine

## 2013-02-17 NOTE — Telephone Encounter (Signed)
Pt requested excuse for jury duty, letter written based on medical grounds

## 2013-03-19 ENCOUNTER — Other Ambulatory Visit: Payer: Self-pay | Admitting: Family Medicine

## 2013-03-20 ENCOUNTER — Other Ambulatory Visit: Payer: Self-pay | Admitting: Family Medicine

## 2013-04-11 ENCOUNTER — Encounter (INDEPENDENT_AMBULATORY_CARE_PROVIDER_SITE_OTHER): Payer: Self-pay

## 2013-04-11 ENCOUNTER — Encounter: Payer: Self-pay | Admitting: Family Medicine

## 2013-04-11 ENCOUNTER — Ambulatory Visit (INDEPENDENT_AMBULATORY_CARE_PROVIDER_SITE_OTHER): Payer: Medicare HMO | Admitting: Family Medicine

## 2013-04-11 VITALS — BP 130/76 | HR 72 | Resp 18 | Ht 70.5 in | Wt 263.1 lb

## 2013-04-11 DIAGNOSIS — Z1211 Encounter for screening for malignant neoplasm of colon: Secondary | ICD-10-CM

## 2013-04-11 DIAGNOSIS — Z0000 Encounter for general adult medical examination without abnormal findings: Secondary | ICD-10-CM

## 2013-04-11 DIAGNOSIS — E785 Hyperlipidemia, unspecified: Secondary | ICD-10-CM

## 2013-04-11 DIAGNOSIS — E669 Obesity, unspecified: Secondary | ICD-10-CM

## 2013-04-11 DIAGNOSIS — Z125 Encounter for screening for malignant neoplasm of prostate: Secondary | ICD-10-CM

## 2013-04-11 DIAGNOSIS — IMO0002 Reserved for concepts with insufficient information to code with codable children: Secondary | ICD-10-CM

## 2013-04-11 DIAGNOSIS — E1165 Type 2 diabetes mellitus with hyperglycemia: Secondary | ICD-10-CM

## 2013-04-11 DIAGNOSIS — Z1212 Encounter for screening for malignant neoplasm of rectum: Secondary | ICD-10-CM

## 2013-04-11 MED ORDER — PRAVASTATIN SODIUM 10 MG PO TABS: 10.0000 mg | ORAL_TABLET | Freq: Every day | ORAL | Status: DC

## 2013-04-11 NOTE — Patient Instructions (Addendum)
F/u July 22 or after, call if you need me before  New for cholesterol is pravachol 10 mg one at bedtime  Please change eating habits , and commit to regula exercise. Weight loss goal of 2 pounds per month  You will be referred to Dr Iona Hansen for diabetic eye exam and reduced vision, Dr Orie Rout for diabetic foot exam and toenail care, and to Dr Suzanna Obey for colonoscopy for colon cancer screening  HBA1C, chem 7 and EGFr today  CBC, fasting lipid, cmp and EGFR, hBA1C, PSa and microalb JUly 19 or after  EKG today is entirely normal
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
## 2013-04-12 ENCOUNTER — Encounter: Payer: Self-pay | Admitting: Family Medicine

## 2013-04-12 LAB — COMPLETE METABOLIC PANEL WITH GFR
ALT: 27 U/L (ref 0–53)
AST: 33 U/L (ref 0–37)
Albumin: 4.3 g/dL (ref 3.5–5.2)
Alkaline Phosphatase: 56 U/L (ref 39–117)
BUN: 14 mg/dL (ref 6–23)
CO2: 27 mEq/L (ref 19–32)
Calcium: 9.2 mg/dL (ref 8.4–10.5)
Chloride: 102 mEq/L (ref 96–112)
Creat: 0.77 mg/dL (ref 0.50–1.35)
GFR, Est African American: >89 mL/min
GFR, Est Non African American: >89 mL/min
Glucose, Bld: 165 mg/dL — ABNORMAL HIGH (ref 70–99)
Potassium: 4.3 mEq/L (ref 3.5–5.3)
Sodium: 139 mEq/L (ref 135–145)
Total Bilirubin: 0.7 mg/dL (ref 0.2–1.2)
Total Protein: 7 g/dL (ref 6.0–8.3)

## 2013-04-12 LAB — HEMOGLOBIN A1C
Hgb A1c MFr Bld: 7.4 % — ABNORMAL HIGH (ref <5.7)
Mean Plasma Glucose: 166 mg/dL — ABNORMAL HIGH (ref <117)

## 2013-04-17 ENCOUNTER — Other Ambulatory Visit: Payer: Self-pay

## 2013-04-17 MED ORDER — GLIPIZIDE ER 5 MG PO TB24: ORAL_TABLET | ORAL | Status: DC

## 2013-04-17 MED ORDER — GLIPIZIDE ER 2.5 MG PO TB24: 2.5000 mg | ORAL_TABLET | Freq: Every day | ORAL | Status: DC

## 2013-04-17 MED ORDER — METFORMIN HCL 1000 MG PO TABS: 1000.0000 mg | ORAL_TABLET | Freq: Two times a day (BID) | ORAL | Status: DC

## 2013-04-24 ENCOUNTER — Other Ambulatory Visit: Payer: Self-pay

## 2013-04-24 DIAGNOSIS — Z0000 Encounter for general adult medical examination without abnormal findings: Secondary | ICD-10-CM | POA: Insufficient documentation

## 2013-04-24 DIAGNOSIS — Z0001 Encounter for general adult medical examination with abnormal findings: Secondary | ICD-10-CM | POA: Insufficient documentation

## 2013-04-24 MED ORDER — BENAZEPRIL HCL 10 MG PO TABS: ORAL_TABLET | ORAL | Status: DC

## 2013-04-24 NOTE — Assessment & Plan Note (Signed)
Welcome to medicare  exam as documented. Counseling done  re healthy lifestyle involving commitment to 150 minutes exercise per week, heart healthy diet, and attaining healthy weight.The importance of adequate sleep also discussed. Regular seat belt use and safe storage  of firearms  is also discussed. Changes in health habits are decided on by the patient with goals and time frames  set for achieving them. Immunization and cancer screening needs are specifically addressed at this visit. Advanced directives are discussed , patient encouraged to document in writing .

## 2013-08-09 ENCOUNTER — Ambulatory Visit: Payer: Medicare HMO | Admitting: Family Medicine

## 2013-08-10 ENCOUNTER — Telehealth: Payer: Self-pay | Admitting: Family Medicine

## 2013-08-10 NOTE — Telephone Encounter (Signed)
Lab order and letter mailed to patient with request.  

## 2013-08-28 ENCOUNTER — Ambulatory Visit: Payer: Medicare HMO | Admitting: Family Medicine

## 2013-09-18 ENCOUNTER — Ambulatory Visit (INDEPENDENT_AMBULATORY_CARE_PROVIDER_SITE_OTHER): Payer: Medicare HMO | Admitting: Family Medicine

## 2013-09-18 ENCOUNTER — Encounter: Payer: Self-pay | Admitting: Family Medicine

## 2013-09-18 VITALS — BP 120/72 | HR 77 | Resp 18 | Ht 70.5 in | Wt 263.1 lb

## 2013-09-18 DIAGNOSIS — IMO0001 Reserved for inherently not codable concepts without codable children: Secondary | ICD-10-CM

## 2013-09-18 DIAGNOSIS — Z1211 Encounter for screening for malignant neoplasm of colon: Secondary | ICD-10-CM

## 2013-09-18 DIAGNOSIS — IMO0002 Reserved for concepts with insufficient information to code with codable children: Secondary | ICD-10-CM

## 2013-09-18 DIAGNOSIS — N528 Other male erectile dysfunction: Secondary | ICD-10-CM

## 2013-09-18 DIAGNOSIS — E1165 Type 2 diabetes mellitus with hyperglycemia: Secondary | ICD-10-CM

## 2013-09-18 DIAGNOSIS — E669 Obesity, unspecified: Secondary | ICD-10-CM

## 2013-09-18 DIAGNOSIS — B351 Tinea unguium: Secondary | ICD-10-CM

## 2013-09-18 DIAGNOSIS — M549 Dorsalgia, unspecified: Secondary | ICD-10-CM

## 2013-09-18 DIAGNOSIS — E785 Hyperlipidemia, unspecified: Secondary | ICD-10-CM

## 2013-09-18 DIAGNOSIS — N529 Male erectile dysfunction, unspecified: Secondary | ICD-10-CM

## 2013-09-18 DIAGNOSIS — Z23 Encounter for immunization: Secondary | ICD-10-CM

## 2013-09-18 MED ORDER — TERBINAFINE HCL 250 MG PO TABS: 250.0000 mg | ORAL_TABLET | Freq: Every day | ORAL | Status: DC

## 2013-09-18 NOTE — Assessment & Plan Note (Signed)
Managed by pain specialist at [facility]

## 2013-09-18 NOTE — Assessment & Plan Note (Signed)
Hyperlipidemia:Low fat diet discussed and encouraged.  Updated lab needed today 

## 2013-09-18 NOTE — Assessment & Plan Note (Signed)
Vaccine administered at visit.  

## 2013-09-18 NOTE — Assessment & Plan Note (Addendum)
Unchanged. Patient re-educated about  the importance of commitment to a  minimum of 150 minutes of exercise per week. The importance of healthy food choices with portion control discussed. Encouraged to start a food diary, count calories and to consider  joining a support group. Sample diet sheets offered. Goals set by the patient for the next several months.    

## 2013-09-18 NOTE — Progress Notes (Signed)
   Subjective:    Patient ID: Bryan Wright, male    DOB: 12/19/1955, 58 y.o.   MRN: [id number]  HPI The PT is here for follow up and re-evaluation of chronic medical conditions, medication management and review of any available recent lab and radiology data.  Preventive health is updated, specifically  Cancer screening and Immunization.referred for colonoscopy and eye exam, both past due   Continues to receive pain management from [facility] The PT denies any adverse reactions to current medications since the last visit.  6 month h/o progressively worsening ability to maintain an erection, wants help Denies polyuria, polydipsia, blurred visipon or hypoglycemic episodes FBG range between 110 to 130    Review of Systems See HPI Denies recent fever or chills. Denies sinus pressure, nasal congestion, ear pain or sore throat. Denies chest congestion, productive cough or wheezing. Denies chest pains, palpitations and leg swelling Denies abdominal pain, nausea, vomiting,diarrhea or constipation.   Denies dysuria, frequency, hesitancy or incontinence. Denies joint pain, swelling and limitation in mobility. Denies headaches, seizures, numbness, or tingling. Denies depression, anxiety or insomnia. Denies skin break down or rash.        Objective:   Physical Exam BP 120/72  Pulse 77  Resp 18  Ht 5' 10.5" (1.791 m)  Wt 263 lb 1.3 oz (119.332 kg)  BMI 37.20 kg/m2  SpO2 96% Patient alert and oriented and in no cardiopulmonary distress.  HEENT: No facial asymmetry, EOMI,   oropharynx pink and moist.  Neck supple no JVD, no mass.  Chest: Clear to auscultation bilaterally.  CVS: S1, S2 no murmurs, no S3.Regular rate.  ABD: Soft non tender.   Ext: No edema  MS: Adequate ROM spine, shoulders, hips and knees.  Skin: Intact, no ulcerations or rash noted.  Psych: Good eye contact, normal affect. Memory intact not anxious or depressed appearing.  CNS: CN 2-12 intact, power,   normal throughout.no focal deficits noted.        Assessment & Plan:  Dyslipidemia, goal LDL below 100 Hyperlipidemia:Low fat diet discussed and encouraged.  Updated lab needed today   Need for prophylactic vaccination and inoculation against influenza Vaccine administered at visit.   DM type 2 (diabetes mellitus, type 2) Controlled, no change in medication Patient advised to reduce carb and sweets, commit to regular physical activity, take meds as prescribed, test blood as directed, and attempt to lose weight, to improve blood sugar control. Updated lab needed    OBESITY Unchanged Patient re-educated about  the importance of commitment to a  minimum of 150 minutes of exercise per week. The importance of healthy food choices with portion control discussed. Encouraged to start a food diary, count calories and to consider  joining a support group. Sample diet sheets offered. Goals set by the patient for the next several months.     BACK PAIN, CHRONIC Managed by pain specialist at [facility]  Erectile dysfunction No known CAD, no nitrates , script for cialis sent to [place], pt aware  Onychomycosis Significant infection of right great toenail,   3  Month course of oral medication

## 2013-09-18 NOTE — Assessment & Plan Note (Addendum)
Significant infection of right great toenail,   3  Month course of oral medication

## 2013-09-18 NOTE — Assessment & Plan Note (Signed)
No known CAD, no nitrates , script for cialis sent to [place], pt aware

## 2013-09-18 NOTE — Assessment & Plan Note (Signed)
Controlled, no change in medication Patient advised to reduce carb and sweets, commit to regular physical activity, take meds as prescribed, test blood as directed, and attempt to lose weight, to improve blood sugar control. Updated lab needed  

## 2013-09-18 NOTE — Patient Instructions (Addendum)
F/u in 4 month, call if you need me before  Flu vaccine today  New for erctile problems is cialis , which is sent to [facility], use as directed  You are referred to Dr Iona Hansen for eye exam  You will be referred to Dr Sydell Axon for colon cancer screening in October  You will be called if your labs show that you need to change any of your medications  Fasting lipid cmp annd EGFr  And HBa1C  In 4 month, before visit  New for fungal infection is terbinafine tablet one daily for 3 months

## 2013-09-19 LAB — COMPLETE METABOLIC PANEL WITH GFR
ALT: 27 U/L (ref 0–53)
AST: 34 U/L (ref 0–37)
Albumin: 4 g/dL (ref 3.5–5.2)
Alkaline Phosphatase: 49 U/L (ref 39–117)
BUN: 11 mg/dL (ref 6–23)
CO2: 28 mEq/L (ref 19–32)
Calcium: 8.8 mg/dL (ref 8.4–10.5)
Chloride: 106 mEq/L (ref 96–112)
Creat: 0.72 mg/dL (ref 0.50–1.35)
GFR, Est African American: >89 mL/min
GFR, Est Non African American: >89 mL/min
Glucose, Bld: 121 mg/dL — ABNORMAL HIGH (ref 70–99)
Potassium: 4 mEq/L (ref 3.5–5.3)
Sodium: 141 mEq/L (ref 135–145)
Total Bilirubin: 0.7 mg/dL (ref 0.2–1.2)
Total Protein: 6.5 g/dL (ref 6.0–8.3)

## 2013-09-19 LAB — HEMOGLOBIN A1C
Hgb A1c MFr Bld: 7 % — ABNORMAL HIGH (ref <5.7)
Mean Plasma Glucose: 154 mg/dL — ABNORMAL HIGH (ref <117)

## 2013-09-19 LAB — CBC
HCT: 40.9 % (ref 39.0–52.0)
Hemoglobin: 14.4 g/dL (ref 13.0–17.0)
MCH: 30.5 pg (ref 26.0–34.0)
MCHC: 35.2 g/dL (ref 30.0–36.0)
MCV: 86.7 fL (ref 78.0–100.0)
Platelets: 186 10*3/uL (ref 150–400)
RBC: 4.72 MIL/uL (ref 4.22–5.81)
RDW: 13.9 % (ref 11.5–15.5)
WBC: 6.8 10*3/uL (ref 4.0–10.5)

## 2013-09-19 LAB — LIPID PANEL
Cholesterol: 149 mg/dL (ref 0–200)
HDL: 38 mg/dL — ABNORMAL LOW (ref >39)
LDL Cholesterol: 92 mg/dL (ref 0–99)
Total CHOL/HDL Ratio: 3.9 Ratio
Triglycerides: 96 mg/dL (ref <150)
VLDL: 19 mg/dL (ref 0–40)

## 2013-09-19 LAB — PSA, MEDICARE: PSA: 2.13 ng/mL (ref <=4.00)

## 2013-09-19 LAB — MICROALBUMIN / CREATININE URINE RATIO
Creatinine, Urine: 155.8 mg/dL
Microalb Creat Ratio: 7.9 mg/g (ref 0.0–30.0)
Microalb, Ur: 1.23 mg/dL (ref 0.00–1.89)

## 2013-09-20 ENCOUNTER — Other Ambulatory Visit: Payer: Self-pay

## 2013-09-20 MED ORDER — BENAZEPRIL HCL 10 MG PO TABS: ORAL_TABLET | ORAL | Status: DC

## 2013-09-20 MED ORDER — METFORMIN HCL 1000 MG PO TABS: 1000.0000 mg | ORAL_TABLET | Freq: Two times a day (BID) | ORAL | Status: DC

## 2013-09-20 MED ORDER — GLIPIZIDE ER 2.5 MG PO TB24: 2.5000 mg | ORAL_TABLET | Freq: Every day | ORAL | Status: DC

## 2013-09-20 MED ORDER — GLIPIZIDE ER 5 MG PO TB24: ORAL_TABLET | ORAL | Status: DC

## 2013-10-03 ENCOUNTER — Telehealth: Payer: Self-pay

## 2013-10-03 NOTE — Telephone Encounter (Signed)
Pt was referred by Dr. Lodema Hong for screening colonoscopy. He has never had one but his wife is having a lot of sickness at this time. He will call when he is ready to schedule.

## 2013-10-03 NOTE — Telephone Encounter (Signed)
noted 

## 2013-10-26 ENCOUNTER — Telehealth: Payer: Self-pay | Admitting: Family Medicine

## 2013-10-26 NOTE — Telephone Encounter (Signed)
Noted  

## 2014-01-17 ENCOUNTER — Ambulatory Visit: Payer: Medicare HMO | Admitting: Family Medicine

## 2014-02-20 ENCOUNTER — Encounter: Payer: Self-pay | Admitting: Family Medicine

## 2014-02-20 ENCOUNTER — Ambulatory Visit (INDEPENDENT_AMBULATORY_CARE_PROVIDER_SITE_OTHER): Payer: Medicare HMO | Admitting: Family Medicine

## 2014-02-20 ENCOUNTER — Encounter (INDEPENDENT_AMBULATORY_CARE_PROVIDER_SITE_OTHER): Payer: Self-pay | Admitting: *Deleted

## 2014-02-20 VITALS — BP 134/76 | HR 82 | Resp 18 | Ht 70.5 in | Wt 263.1 lb

## 2014-02-20 DIAGNOSIS — E119 Type 2 diabetes mellitus without complications: Secondary | ICD-10-CM

## 2014-02-20 DIAGNOSIS — M5442 Lumbago with sciatica, left side: Secondary | ICD-10-CM

## 2014-02-20 DIAGNOSIS — F17210 Nicotine dependence, cigarettes, uncomplicated: Secondary | ICD-10-CM

## 2014-02-20 DIAGNOSIS — J209 Acute bronchitis, unspecified: Secondary | ICD-10-CM | POA: Diagnosis not present

## 2014-02-20 DIAGNOSIS — Z1211 Encounter for screening for malignant neoplasm of colon: Secondary | ICD-10-CM | POA: Diagnosis not present

## 2014-02-20 DIAGNOSIS — I10 Essential (primary) hypertension: Secondary | ICD-10-CM

## 2014-02-20 DIAGNOSIS — E785 Hyperlipidemia, unspecified: Secondary | ICD-10-CM

## 2014-02-20 DIAGNOSIS — F17208 Nicotine dependence, unspecified, with other nicotine-induced disorders: Secondary | ICD-10-CM

## 2014-02-20 MED ORDER — PENICILLIN V POTASSIUM 500 MG PO TABS: 500.0000 mg | ORAL_TABLET | Freq: Three times a day (TID) | ORAL | Status: DC

## 2014-02-20 MED ORDER — BENZONATATE 100 MG PO CAPS: 100.0000 mg | ORAL_CAPSULE | Freq: Two times a day (BID) | ORAL | Status: DC

## 2014-02-20 NOTE — Progress Notes (Signed)
Subjective:    Patient ID: Bryan Wright, male    DOB: 28-Sep-1955, 59 y.o.   MRN: [id number]  HPI The PT is here for follow up and re-evaluation of chronic medical conditions, medication management and review of any available recent lab and radiology data.  Preventive health is updated, specifically  Cancer screening and Immunization.  Needs colonoscopy Questions or concerns regarding consultations or procedures which the PT has had in the interim are  addressed. The PT denies any adverse reactions to current medications since the last visit.  There are no new concerns.  1 week h/o increased chest congestion and sputum     Review of Systems See HPI Denies recent fever or chills. Denies sinus pressure, nasal congestion, ear pain or sore throat.. Denies chest pains, palpitations and leg swelling Denies abdominal pain, nausea, vomiting,diarrhea or constipation.   Denies dysuria, frequency, hesitancy or incontinence. Denies uncontrolled  joint pain, swelling and limitation in mobility. Denies headaches, seizures, numbness, or tingling. Denies depression, anxiety or insomnia. Denies skin break down or rash.        Objective:   Physical Exam BP 134/76 mmHg  Pulse 82  Resp 18  Ht 5' 10.5" (1.791 m)  Wt 263 lb 1.9 oz (119.35 kg)  BMI 37.21 kg/m2  SpO2 96% Patient alert and oriented and in no cardiopulmonary distress.  HEENT: No facial asymmetry, EOMI,   oropharynx pink and moist.  Neck supple no JVD, no mass.  Chest: decreased air entry , bilateral crackles  CVS: S1, S2 no murmurs, no S3.Regular rate.  ABD: Soft non tender.   Ext: No edema  MS: Adequate ROM spine, shoulders, hips and knees.  Skin: Intact, no ulcerations or rash noted.  Psych: Good eye contact, normal affect. Memory intact not anxious or depressed appearing.  CNS: CN 2-12 intact, power,  normal throughout.no focal deficits noted.        Assessment & Plan:  Acute bronchitis Decongestant  and antibiotic prescribed and CXr recommended   DM type 2 (diabetes mellitus, type 2) Deteriorated and uncontrolled with updated lab Patient educated about the importance of limiting  Carbohydrate intake , the need to commit to daily physical activity for a minimum of 30 minutes , and to commit weight loss. The fact that changes in all these areas will improve diabetes control is stressed Diabetic Labs Latest Ref Rng 02/28/2014 09/18/2013 04/11/2013 08/05/2012 12/02/2009  HbA1c <5.7 % 7.1(H) 7.0(H) 7.4(H) 8.7(H) -  Microalbumin 0.00 - 1.89 mg/dL - 7.82 - 9.56 2.13(Y)  Micro/Creat Ratio 0.0 - 30.0 mg/g - 7.9 - 9.9 11.1  Chol 0 - 200 mg/dL - 865 - 784 -  HDL >69 mg/dL - 62(X) - 46 -  Calc LDL 0 - 99 mg/dL - 92 - 528(U) -  Triglycerides <150 mg/dL - 96 - 132 -  Creatinine 0.50 - 1.35 mg/dL 4.40 1.02 7.25 3.66 -   BP/Weight 02/20/2014 09/18/2013 04/11/2013 08/04/2012 08/26/2010 12/02/2009 08/14/2009  Systolic BP 134 120 130 140 140 130 128  Diastolic BP 76 72 76 80 84 80 70  Wt. (Lbs) 263.12 263.08 263.08 258 273 262 266.75  BMI 37.21 37.2 37.2 36.48 38.6 36.04 36.69   Foot/eye exam completion dates 09/18/2013 08/04/2012  Foot Form Completion Done Done        Dyslipidemia, goal LDL below 100 Updated lab needed at/ before next visit. Hyperlipidemia:Low fat diet discussed and encouraged.Needs toi increase daly physical activity   Lipid Panel  Lab Results  Component Value  Date   CHOL 149 09/18/2013   HDL 38* 09/18/2013   LDLCALC 92 09/18/2013   TRIG 96 09/18/2013   CHOLHDL 3.9 09/18/2013          Backache Unchanged, [place] through pain clinic in [place]   Essential hypertension Controlled, no change in medication DASH diet and commitment to daily physical activity for a minimum of 30 minutes discussed and encouraged, as a part of hypertension management. The importance of attaining a healthy weight is also discussed.  BP/Weight 02/20/2014 09/18/2013 04/11/2013 08/04/2012 08/26/2010  12/02/2009 08/14/2009  Systolic BP 134 120 130 140 140 130 128  Diastolic BP 76 72 76 80 84 80 70  Wt. (Lbs) 263.12 263.08 263.08 258 273 262 266.75  BMI 37.21 37.2 37.2 36.48 38.6 36.04 36.69         Nicotine dependence Unchanged Patient counseled for approximately 5 minutes regarding the health risks of ongoing nicotine use, specifically all types of cancer, heart disease, stroke and respiratory failure. The options available for help with cessation ,the behavioral changes to assist the process, and the option to either gradully reduce usage  Or abruptly stop.is also discussed. Pt is also encouraged to set specific goals in number of cigarettes used daily, as well as to set a quit date.  Still continues toi chew tobacco daily    Morbid obesity Unchanged. Patient re-educated about  the importance of commitment to a  minimum of 150 minutes of exercise per week.  The importance of healthy food choices with portion control discussed. Encouraged to start a food diary, count calories and to consider  joining a support group. Sample diet sheets offered. Goals set by the patient for the next several months.   Weight /BMI 02/20/2014 09/18/2013 04/11/2013  WEIGHT 263 lb 1.9 oz 263 lb 1.3 oz 263 lb 1.3 oz  HEIGHT 5' 10.5" 5' 10.5" 5' 10.5"  BMI 37.21 kg/m2 37.2 kg/m2 37.2 kg/m2    Current exercise per week 60 minutes.

## 2014-02-20 NOTE — Patient Instructions (Addendum)
Annual wellness in 4.5 month.call if you need me before   Penicillin and tessalon perles are, prescribed for bronchitis  Cmp and EGFR, hBA1C and TSH today  Fasting lipid, cmp and EGFR, hBA1c,  In 4.5 month  CHEWING TOBACCO IS UNHEALTHY, tHIS INCREASES YOUR RISK OF cancer, stroke and heart disease, please work on stopping  Since you have been coughing , I do rcommend the CXr  You are referred to Dr Laural Golden end March for colonoscopy

## 2014-02-21 ENCOUNTER — Telehealth: Payer: Self-pay | Admitting: Family Medicine

## 2014-02-21 DIAGNOSIS — IMO0002 Reserved for concepts with insufficient information to code with codable children: Secondary | ICD-10-CM

## 2014-02-21 DIAGNOSIS — E1165 Type 2 diabetes mellitus with hyperglycemia: Secondary | ICD-10-CM

## 2014-02-21 DIAGNOSIS — N528 Other male erectile dysfunction: Secondary | ICD-10-CM

## 2014-02-28 MED ORDER — PRAVASTATIN SODIUM 10 MG PO TABS: 10.0000 mg | ORAL_TABLET | Freq: Every day | ORAL | Status: DC

## 2014-02-28 NOTE — Telephone Encounter (Signed)
meds refilled 

## 2014-02-28 NOTE — Telephone Encounter (Signed)
meds sent to the pharmacy

## 2014-03-01 ENCOUNTER — Telehealth: Payer: Self-pay | Admitting: *Deleted

## 2014-03-01 LAB — COMPLETE METABOLIC PANEL WITH GFR
ALT: 25 U/L (ref 0–53)
AST: 33 U/L (ref 0–37)
Albumin: 4.3 g/dL (ref 3.5–5.2)
Alkaline Phosphatase: 65 U/L (ref 39–117)
BUN: 14 mg/dL (ref 6–23)
CO2: 29 mEq/L (ref 19–32)
Calcium: 9.1 mg/dL (ref 8.4–10.5)
Chloride: 100 mEq/L (ref 96–112)
Creat: 0.78 mg/dL (ref 0.50–1.35)
GFR, Est African American: >89 mL/min
GFR, Est Non African American: >89 mL/min
Glucose, Bld: 221 mg/dL — ABNORMAL HIGH (ref 70–99)
Potassium: 4.5 mEq/L (ref 3.5–5.3)
Sodium: 138 mEq/L (ref 135–145)
Total Bilirubin: 0.5 mg/dL (ref 0.2–1.2)
Total Protein: 6.7 g/dL (ref 6.0–8.3)

## 2014-03-01 LAB — HEMOGLOBIN A1C
Hgb A1c MFr Bld: 7.1 % — ABNORMAL HIGH (ref <5.7)
Mean Plasma Glucose: 157 mg/dL — ABNORMAL HIGH (ref <117)

## 2014-03-01 LAB — TSH: TSH: 1.234 u[IU]/mL (ref 0.350–4.500)

## 2014-03-01 NOTE — Telephone Encounter (Signed)
Pt called stating he needs his diabetic medicine refilled he is out

## 2014-03-01 NOTE — Telephone Encounter (Signed)
These were refilled yesterday. Needs to call pharmacy

## 2014-03-01 NOTE — Telephone Encounter (Signed)
Pt called stating he needs his rx refilled, pt said he seen Dr. Lodema HongSimpson last week. Please advise

## 2014-03-02 ENCOUNTER — Telehealth: Payer: Self-pay | Admitting: Family Medicine

## 2014-03-02 ENCOUNTER — Other Ambulatory Visit: Payer: Self-pay

## 2014-03-02 MED ORDER — GLIPIZIDE ER 2.5 MG PO TB24: 2.5000 mg | ORAL_TABLET | Freq: Every day | ORAL | Status: DC

## 2014-03-02 MED ORDER — METFORMIN HCL 1000 MG PO TABS: 1000.0000 mg | ORAL_TABLET | Freq: Two times a day (BID) | ORAL | Status: DC

## 2014-03-02 MED ORDER — GLIPIZIDE ER 5 MG PO TB24: ORAL_TABLET | ORAL | Status: DC

## 2014-03-02 MED ORDER — BENAZEPRIL HCL 10 MG PO TABS: ORAL_TABLET | ORAL | Status: DC

## 2014-03-02 NOTE — Telephone Encounter (Signed)
All meds have been faxed and refaxed

## 2014-06-26 DIAGNOSIS — I10 Essential (primary) hypertension: Secondary | ICD-10-CM

## 2014-06-26 DIAGNOSIS — E1159 Type 2 diabetes mellitus with other circulatory complications: Secondary | ICD-10-CM | POA: Insufficient documentation

## 2014-06-26 DIAGNOSIS — J209 Acute bronchitis, unspecified: Secondary | ICD-10-CM | POA: Insufficient documentation

## 2014-06-26 DIAGNOSIS — I152 Hypertension secondary to endocrine disorders: Secondary | ICD-10-CM | POA: Insufficient documentation

## 2014-06-26 DIAGNOSIS — F17200 Nicotine dependence, unspecified, uncomplicated: Secondary | ICD-10-CM | POA: Insufficient documentation

## 2014-06-26 NOTE — Assessment & Plan Note (Signed)
Controlled, no change in medication DASH diet and commitment to daily physical activity for a minimum of 30 minutes discussed and encouraged, as a part of hypertension management. The importance of attaining a healthy weight is also discussed.  BP/Weight 02/20/2014 09/18/2013 04/11/2013 08/04/2012 08/26/2010 12/02/2009 08/14/2009  Systolic BP 134 120 130 140 140 130 128  Diastolic BP 76 72 76 80 84 80 70  Wt. (Lbs) 263.12 263.08 263.08 258 273 262 266.75  BMI 37.21 37.2 37.2 36.48 38.6 36.04 36.69

## 2014-06-26 NOTE — Assessment & Plan Note (Signed)
Deteriorated and uncontrolled with updated lab Patient educated about the importance of limiting  Carbohydrate intake , the need to commit to daily physical activity for a minimum of 30 minutes , and to commit weight loss. The fact that changes in all these areas will improve diabetes control is stressed Diabetic Labs Latest Ref Rng 02/28/2014 09/18/2013 04/11/2013 08/05/2012 12/02/2009  HbA1c <5.7 % 7.1(H) 7.0(H) 7.4(H) 8.7(H) -  Microalbumin 0.00 - 1.89 mg/dL - 1.611.23 - 0.961.43 0.45(W2.83(H)  Micro/Creat Ratio 0.0 - 30.0 mg/g - 7.9 - 9.9 11.1  Chol 0 - 200 mg/dL - 098149 - 119181 -  HDL >14>39 mg/dL - 78(G38(L) - 46 -  Calc LDL 0 - 99 mg/dL - 92 - 956(O112(H) -  Triglycerides <150 mg/dL - 96 - 130117 -  Creatinine 0.50 - 1.35 mg/dL 8.650.78 7.840.72 6.960.77 2.950.74 -   BP/Weight 02/20/2014 09/18/2013 04/11/2013 08/04/2012 08/26/2010 12/02/2009 08/14/2009  Systolic BP 134 120 130 140 140 130 128  Diastolic BP 76 72 76 80 84 80 70  Wt. (Lbs) 263.12 263.08 263.08 258 273 262 266.75  BMI 37.21 37.2 37.2 36.48 38.6 36.04 36.69   Foot/eye exam completion dates 09/18/2013 08/04/2012  Foot Form Completion Done Done

## 2014-06-26 NOTE — Assessment & Plan Note (Signed)
Decongestant and antibiotic prescribed and CXr recommended

## 2014-06-26 NOTE — Assessment & Plan Note (Signed)
Unchanged, [place]

## 2014-06-26 NOTE — Assessment & Plan Note (Signed)
Unchanged Patient counseled for approximately 5 minutes regarding the health risks of ongoing nicotine use, specifically all types of cancer, heart disease, stroke and respiratory failure. The options available for help with cessation ,the behavioral changes to assist the process, and the option to either gradully reduce usage  Or abruptly stop.is also discussed. Pt is also encouraged to set specific goals in number of cigarettes used daily, as well as to set a quit date.  Still continues toi chew tobacco daily

## 2014-06-26 NOTE — Assessment & Plan Note (Signed)
Unchanged. Patient re-educated about  the importance of commitment to a  minimum of 150 minutes of exercise per week.  The importance of healthy food choices with portion control discussed. Encouraged to start a food diary, count calories and to consider  joining a support group. Sample diet sheets offered. Goals set by the patient for the next several months.   Weight /BMI 02/20/2014 09/18/2013 04/11/2013  WEIGHT 263 lb 1.9 oz 263 lb 1.3 oz 263 lb 1.3 oz  HEIGHT 5' 10.5" 5' 10.5" 5' 10.5"  BMI 37.21 kg/m2 37.2 kg/m2 37.2 kg/m2    Current exercise per week 60 minutes.

## 2014-06-26 NOTE — Assessment & Plan Note (Signed)
Updated lab needed at/ before next visit. Hyperlipidemia:Low fat diet discussed and encouraged.Needs toi increase daly physical activity   Lipid Panel  Lab Results  Component Value Date   CHOL 149 09/18/2013   HDL 38* 09/18/2013   LDLCALC 92 09/18/2013   TRIG 96 09/18/2013   CHOLHDL 3.9 09/18/2013

## 2014-07-17 ENCOUNTER — Telehealth: Payer: Self-pay | Admitting: *Deleted

## 2014-07-17 DIAGNOSIS — E1165 Type 2 diabetes mellitus with hyperglycemia: Secondary | ICD-10-CM

## 2014-07-17 DIAGNOSIS — IMO0002 Reserved for concepts with insufficient information to code with codable children: Secondary | ICD-10-CM

## 2014-07-17 MED ORDER — BENAZEPRIL HCL 10 MG PO TABS: ORAL_TABLET | ORAL | Status: DC

## 2014-07-17 MED ORDER — PRAVASTATIN SODIUM 10 MG PO TABS: 10.0000 mg | ORAL_TABLET | Freq: Every day | ORAL | Status: DC

## 2014-07-17 MED ORDER — METFORMIN HCL 1000 MG PO TABS: 1000.0000 mg | ORAL_TABLET | Freq: Two times a day (BID) | ORAL | Status: DC

## 2014-07-17 MED ORDER — GLIPIZIDE ER 5 MG PO TB24: ORAL_TABLET | ORAL | Status: DC

## 2014-07-17 MED ORDER — GLIPIZIDE ER 2.5 MG PO TB24: 2.5000 mg | ORAL_TABLET | Freq: Every day | ORAL | Status: DC

## 2014-07-17 NOTE — Telephone Encounter (Signed)
Pt called requesting a refill on his medications, pt is scheduled for 08/21/14. Pt rescheduled his appt for 07/19/14 it was a physical

## 2014-07-17 NOTE — Telephone Encounter (Signed)
1 refill only given until appt

## 2014-07-19 ENCOUNTER — Encounter: Payer: Medicare HMO | Admitting: Family Medicine

## 2014-07-26 ENCOUNTER — Telehealth: Payer: Self-pay | Admitting: *Deleted

## 2014-07-26 ENCOUNTER — Other Ambulatory Visit: Payer: Self-pay

## 2014-07-26 MED ORDER — METFORMIN HCL 1000 MG PO TABS: 1000.0000 mg | ORAL_TABLET | Freq: Two times a day (BID) | ORAL | Status: DC

## 2014-07-26 MED ORDER — GLIPIZIDE ER 2.5 MG PO TB24: 2.5000 mg | ORAL_TABLET | Freq: Every day | ORAL | Status: DC

## 2014-07-26 MED ORDER — GLIPIZIDE ER 5 MG PO TB24: ORAL_TABLET | ORAL | Status: DC

## 2014-07-26 NOTE — Telephone Encounter (Signed)
meds refilled 

## 2014-07-26 NOTE — Telephone Encounter (Signed)
Pt called requesting his diabetic medication to be refilled. Please advise

## 2014-08-21 ENCOUNTER — Encounter: Payer: Self-pay | Admitting: Family Medicine

## 2014-08-21 ENCOUNTER — Ambulatory Visit (INDEPENDENT_AMBULATORY_CARE_PROVIDER_SITE_OTHER): Payer: Medicare HMO | Admitting: Family Medicine

## 2014-08-21 VITALS — BP 140/84 | HR 84 | Resp 18 | Ht 70.5 in | Wt 266.0 lb

## 2014-08-21 DIAGNOSIS — E118 Type 2 diabetes mellitus with unspecified complications: Secondary | ICD-10-CM | POA: Diagnosis not present

## 2014-08-21 DIAGNOSIS — M5440 Lumbago with sciatica, unspecified side: Secondary | ICD-10-CM

## 2014-08-21 DIAGNOSIS — E785 Hyperlipidemia, unspecified: Secondary | ICD-10-CM

## 2014-08-21 DIAGNOSIS — Z114 Encounter for screening for human immunodeficiency virus [HIV]: Secondary | ICD-10-CM

## 2014-08-21 DIAGNOSIS — E1165 Type 2 diabetes mellitus with hyperglycemia: Secondary | ICD-10-CM | POA: Diagnosis not present

## 2014-08-21 DIAGNOSIS — B351 Tinea unguium: Secondary | ICD-10-CM

## 2014-08-21 DIAGNOSIS — IMO0002 Reserved for concepts with insufficient information to code with codable children: Secondary | ICD-10-CM

## 2014-08-21 DIAGNOSIS — N521 Erectile dysfunction due to diseases classified elsewhere: Secondary | ICD-10-CM

## 2014-08-21 DIAGNOSIS — I10 Essential (primary) hypertension: Secondary | ICD-10-CM

## 2014-08-21 MED ORDER — BENAZEPRIL HCL 10 MG PO TABS: ORAL_TABLET | ORAL | Status: DC

## 2014-08-21 MED ORDER — PRAVASTATIN SODIUM 10 MG PO TABS: 10.0000 mg | ORAL_TABLET | Freq: Every day | ORAL | Status: DC

## 2014-08-21 MED ORDER — METFORMIN HCL 1000 MG PO TABS: 1000.0000 mg | ORAL_TABLET | Freq: Two times a day (BID) | ORAL | Status: DC

## 2014-08-21 NOTE — Patient Instructions (Addendum)
Annual wellness in 4 month, call if you need me before  You are referred to podiatrist for help with left 2nd toenail urgently, no infection present at this time  Fasting labs tomorrow  Please call Dr Karilyn Cota to schedule your colonoscopy  Work on weight loss by changing eating habits please, you will be healthier  Thanks for choosing [facility], we consider it a privelige to serve you. New pharmacy is [facility] except for 1 med , cialis , to [facility]

## 2014-08-21 NOTE — Progress Notes (Signed)
Subjective:    Patient ID: Bryan Wright, male    DOB: January 19, 1956, 59 y.o.   MRN: [id number]  HPI   Bryan Wright     MRN: [id number]      DOB: 09-22-1955   HPI Bryan Wright is here for follow up and re-evaluation of chronic medical conditions, medication management and review of any available recent lab and radiology data.  Preventive health is updated, specifically  Cancer screening and Immunization.  Still needs his colonoscopy, statres he ill work on this Questions or concerns regarding consultations or procedures which the PT has had in the interim are  addressed. The PT denies any adverse reactions to current medications since the last visit.  C/opartially detatched  left 2nd toenail  Following trauma 6 days ago and wants help for this , also nails are excessively long and thick, needs help with getting them trimmed  ROS Denies recent fever or chills. Denies sinus pressure, nasal congestion, ear pain or sore throat. Denies chest congestion, productive cough or wheezing. Denies chest pains, palpitations and leg swelling Denies abdominal pain, nausea, vomiting,diarrhea or constipation.   Denies dysuria, frequency, hesitancy or incontinence. Denies uncontrolled  joint pain, swelling and limitation in mobility. Denies headaches, seizures, numbness, or tingling. Denies depression, anxiety or insomnia.    PE  BP 140/84 mmHg  Pulse 84  Resp 18  Ht 5' 10.5" (1.791 m)  Wt 266 lb 0.6 oz (120.675 kg)  BMI 37.62 kg/m2  SpO2 97%  Patient alert and oriented and in no cardiopulmonary distress.  HEENT: No facial asymmetry, EOMI,   oropharynx pink and moist.  Neck supple no JVD, no mass.  Chest: Clear to auscultation bilaterally.  CVS: S1, S2 no murmurs, no S3.Regular rate.  ABD: Soft non tender.   Ext: No edema  MS: Adequate ROM spine, shoulders, hips and knees.  Skin: Intact, no ulcerations or rash noted.ipartially detatched  left 2nd toenail, not infected,  onychomycosis and long nails on other toes  Psych: Good eye contact, normal affect. Memory intact not anxious or depressed appearing.  CNS: CN 2-12 intact, power,  normal throughout.no focal deficits noted.   Assessment & Plan   Onychomycosis Severe bilateral onychomycosis with long nails, also partially detatched left 2nd toenail, refer to podiatry for definitive management  DM type 2 (diabetes mellitus, type 2) Bryan Wright is reminded of the importance of commitment to daily physical activity for 30 minutes or more, as able and the need to limit carbohydrate intake to 30 to 60 grams per meal to help with blood sugar control.   The need to take medication as prescribed, test blood sugar as directed, and to call between visits if there is a concern that blood sugar is uncontrolled is also discussed.   Bryan Wright is reminded of the importance of daily foot exam, annual eye examination, and good blood sugar, blood pressure and cholesterol control.' Adequately though not as well controlled as in the past, closer attention to diet and exercise needed  Diabetic Labs Latest Ref Rng 08/21/2014 02/28/2014 09/18/2013 04/11/2013 08/05/2012  HbA1c <5.7 % 7.4(H) 7.1(H) 7.0(H) 7.4(H) 8.7(H)  Microalbumin 0.00 - 1.89 mg/dL - - 1.23 - 1.43  Micro/Creat Ratio 0.0 - 30.0 mg/g - - 7.9 - 9.9  Chol 125 - 200 mg/dL 180 - 149 - 181  HDL >=40 mg/dL 42 - 38(L) - 46  Calc LDL <130 mg/dL 118 - 92 - 112(H)  Triglycerides <150 mg/dL 99 - 96 - 117  Creatinine 0.70 - 1.33 mg/dL 0.78 0.78 0.72 0.77 0.74   BP/Weight 08/21/2014 02/20/2014 09/18/2013 04/11/2013 08/04/2012 08/26/2010 97/02/6376  Systolic BP 588 502 774 128 786 767 209  Diastolic BP 84 76 72 76 80 84 80  Wt. (Lbs) 266.04 263.12 263.08 263.08 258 273 262  BMI 37.62 37.21 37.2 37.2 36.48 38.6 36.04   Foot/eye exam completion dates Latest Ref Rng 09/04/2014 09/18/2013  Eye Exam No Retinopathy No Retinopathy -  Foot Form Completion - - Done          Essential hypertension UnControlled, no change in medication, pt to focus on lifestyle change to correct this DASH diet and commitment to daily physical activity for a minimum of 30 minutes discussed and encouraged, as a part of hypertension management. The importance of attaining a healthy weight is also discussed.  BP/Weight 08/21/2014 02/20/2014 09/18/2013 04/11/2013 08/04/2012 08/26/2010 47/09/6281  Systolic BP 662 947 654 650 354 656 812  Diastolic BP 84 76 72 76 80 84 80  Wt. (Lbs) 266.04 263.12 263.08 263.08 258 273 262  BMI 37.62 37.21 37.2 37.2 36.48 38.6 36.04        Morbid obesity Deteriorated. Patient re-educated about  the importance of commitment to a  minimum of 150 minutes of exercise per week.  The importance of healthy food choices with portion control discussed. Encouraged to start a food diary, count calories and to consider  joining a support group. Sample diet sheets offered. Goals set by the patient for the next several months.   Weight /BMI 08/21/2014 02/20/2014 09/18/2013  WEIGHT 266 lb 0.6 oz 263 lb 1.9 oz 263 lb 1.3 oz  HEIGHT 5' 10.5" 5' 10.5" 5' 10.5"  BMI 37.62 kg/m2 37.21 kg/m2 37.2 kg/m2    Current exercise per week 90 minutes.   Backache Unchanged, continue chronic pain management through pain clinic  Dyslipidemia, goal LDL below 100 Hyperlipidemia:Low fat diet discussed and encouraged.   Lipid Panel  Lab Results  Component Value Date   CHOL 180 08/21/2014   HDL 42 08/21/2014   LDLCALC 118 08/21/2014   TRIG 99 08/21/2014   CHOLHDL 4.3 08/21/2014   Needs to lower LDL , goal is not met currently     Erectile dysfunction Reports best response to cialis, will prescribe same, sent to pharmacy of his choice       Review of Systems     Objective:   Physical Exam        Assessment & Plan:

## 2014-08-22 LAB — COMPLETE METABOLIC PANEL WITH GFR
ALT: 29 U/L (ref 9–46)
AST: 31 U/L (ref 10–35)
Albumin: 4.2 g/dL (ref 3.6–5.1)
Alkaline Phosphatase: 51 U/L (ref 40–115)
BUN: 13 mg/dL (ref 7–25)
CO2: 25 mmol/L (ref 20–31)
Calcium: 9 mg/dL (ref 8.6–10.3)
Chloride: 103 mmol/L (ref 98–110)
Creat: 0.78 mg/dL (ref 0.70–1.33)
GFR, Est African American: >89 mL/min (ref >=60)
GFR, Est Non African American: >89 mL/min (ref >=60)
Glucose, Bld: 163 mg/dL — ABNORMAL HIGH (ref 65–99)
Potassium: 4.4 mmol/L (ref 3.5–5.3)
Sodium: 138 mmol/L (ref 135–146)
Total Bilirubin: 0.7 mg/dL (ref 0.2–1.2)
Total Protein: 6.9 g/dL (ref 6.1–8.1)

## 2014-08-22 LAB — LIPID PANEL
Cholesterol: 180 mg/dL (ref 125–200)
HDL: 42 mg/dL (ref >=40)
LDL Cholesterol: 118 mg/dL (ref <130)
Total CHOL/HDL Ratio: 4.3 Ratio (ref <=5.0)
Triglycerides: 99 mg/dL (ref <150)
VLDL: 20 mg/dL (ref <30)

## 2014-08-22 LAB — HEMOGLOBIN A1C
Hgb A1c MFr Bld: 7.4 % — ABNORMAL HIGH (ref <5.7)
Mean Plasma Glucose: 166 mg/dL — ABNORMAL HIGH (ref <117)

## 2014-08-22 LAB — HIV ANTIBODY (ROUTINE TESTING W REFLEX): HIV 1&2 Ab, 4th Generation: NONREACTIVE

## 2014-08-24 MED ORDER — GLIPIZIDE ER 2.5 MG PO TB24: 2.5000 mg | ORAL_TABLET | Freq: Every day | ORAL | Status: DC

## 2014-08-24 MED ORDER — GLIPIZIDE ER 5 MG PO TB24: ORAL_TABLET | ORAL | Status: DC

## 2014-09-04 LAB — HM DIABETES EYE EXAM

## 2014-10-11 ENCOUNTER — Other Ambulatory Visit: Payer: Self-pay | Admitting: Family Medicine

## 2014-10-14 NOTE — Assessment & Plan Note (Signed)
UnControlled, no change in medication, pt to focus on lifestyle change to correct this DASH diet and commitment to daily physical activity for a minimum of 30 minutes discussed and encouraged, as a part of hypertension management. The importance of attaining a healthy weight is also discussed.  BP/Weight 08/21/2014 02/20/2014 09/18/2013 04/11/2013 08/04/2012 08/26/2010 12/02/2009  Systolic BP 140 134 120 130 140 140 130  Diastolic BP 84 76 72 76 80 84 80  Wt. (Lbs) 266.04 263.12 263.08 263.08 258 273 262  BMI 37.62 37.21 37.2 37.2 36.48 38.6 36.04

## 2014-10-14 NOTE — Assessment & Plan Note (Signed)
Hyperlipidemia:Low fat diet discussed and encouraged.   Lipid Panel  Lab Results  Component Value Date   CHOL 180 08/21/2014   HDL 42 08/21/2014   LDLCALC 118 08/21/2014   TRIG 99 08/21/2014   CHOLHDL 4.3 08/21/2014   Needs to lower LDL , goal is not met currently    

## 2014-10-14 NOTE — Assessment & Plan Note (Signed)
Reports best response to cialis, will prescribe same, sent to pharmacy of his choice

## 2014-10-14 NOTE — Assessment & Plan Note (Signed)
Unchanged, continue chronic pain management through pain clinic

## 2014-10-14 NOTE — Assessment & Plan Note (Signed)
Bryan Wright is reminded of the importance of commitment to daily physical activity for 30 minutes or more, as able and the need to limit carbohydrate intake to 30 to 60 grams per meal to help with blood sugar control.   The need to take medication as prescribed, test blood sugar as directed, and to call between visits if there is a concern that blood sugar is uncontrolled is also discussed.   Bryan Wright is reminded of the importance of daily foot exam, annual eye examination, and good blood sugar, blood pressure and cholesterol control.' Adequately though not as well controlled as in the past, closer attention to diet and exercise needed  Diabetic Labs Latest Ref Rng 08/21/2014 02/28/2014 09/18/2013 04/11/2013 08/05/2012  HbA1c <5.7 % 7.4(H) 7.1(H) 7.0(H) 7.4(H) 8.7(H)  Microalbumin 0.00 - 1.89 mg/dL - - 2.95 - 6.21  Micro/Creat Ratio 0.0 - 30.0 mg/g - - 7.9 - 9.9  Chol 125 - 200 mg/dL 308 - 657 - 846  HDL >=96 mg/dL 42 - 29(B) - 46  Calc LDL <130 mg/dL 284 - 92 - 132(G)  Triglycerides <150 mg/dL 99 - 96 - 401  Creatinine 0.70 - 1.33 mg/dL 0.27 2.53 6.64 4.03 4.74   BP/Weight 08/21/2014 02/20/2014 09/18/2013 04/11/2013 08/04/2012 08/26/2010 12/02/2009  Systolic BP 140 134 120 130 140 140 130  Diastolic BP 84 76 72 76 80 84 80  Wt. (Lbs) 266.04 263.12 263.08 263.08 258 273 262  BMI 37.62 37.21 37.2 37.2 36.48 38.6 36.04   Foot/eye exam completion dates Latest Ref Rng 09/04/2014 09/18/2013  Eye Exam No Retinopathy No Retinopathy -  Foot Form Completion - - Done

## 2014-10-14 NOTE — Assessment & Plan Note (Signed)
Severe bilateral onychomycosis with long nails, also partially detatched left 2nd toenail, refer to podiatry for definitive management

## 2014-10-14 NOTE — Assessment & Plan Note (Signed)
Deteriorated. Patient re-educated about  the importance of commitment to a  minimum of 150 minutes of exercise per week.  The importance of healthy food choices with portion control discussed. Encouraged to start a food diary, count calories and to consider  joining a support group. Sample diet sheets offered. Goals set by the patient for the next several months.   Weight /BMI 08/21/2014 02/20/2014 09/18/2013  WEIGHT 266 lb 0.6 oz 263 lb 1.9 oz 263 lb 1.3 oz  HEIGHT 5' 10.5" 5' 10.5" 5' 10.5"  BMI 37.62 kg/m2 37.21 kg/m2 37.2 kg/m2    Current exercise per week 90 minutes.

## 2014-11-14 DIAGNOSIS — Z5181 Encounter for therapeutic drug level monitoring: Secondary | ICD-10-CM | POA: Diagnosis not present

## 2014-11-14 DIAGNOSIS — M961 Postlaminectomy syndrome, not elsewhere classified: Secondary | ICD-10-CM | POA: Diagnosis not present

## 2014-11-14 DIAGNOSIS — Z9689 Presence of other specified functional implants: Secondary | ICD-10-CM | POA: Diagnosis not present

## 2014-11-14 DIAGNOSIS — Z79899 Other long term (current) drug therapy: Secondary | ICD-10-CM | POA: Diagnosis not present

## 2014-11-14 DIAGNOSIS — M545 Low back pain: Secondary | ICD-10-CM | POA: Diagnosis not present

## 2014-11-14 DIAGNOSIS — G894 Chronic pain syndrome: Secondary | ICD-10-CM | POA: Diagnosis not present

## 2014-11-23 ENCOUNTER — Other Ambulatory Visit: Payer: Self-pay | Admitting: Family Medicine

## 2014-12-19 ENCOUNTER — Telehealth: Payer: Self-pay | Admitting: Family Medicine

## 2014-12-19 NOTE — Telephone Encounter (Signed)
meds refilled 11/7

## 2014-12-19 NOTE — Telephone Encounter (Signed)
Patient is requesting a refill on glipiZIDE (GLUCOTROL XL) 5 MG 24 hr tablet  and GLIPIZIDE XL 2.5 MG 24 hr tablet

## 2015-01-02 ENCOUNTER — Encounter: Payer: Medicare HMO | Admitting: Family Medicine

## 2015-01-22 ENCOUNTER — Telehealth: Payer: Self-pay | Admitting: Family Medicine

## 2015-01-22 MED ORDER — METFORMIN HCL 1000 MG PO TABS: ORAL_TABLET | ORAL | Status: DC

## 2015-01-22 MED ORDER — BENAZEPRIL HCL 10 MG PO TABS: 10.0000 mg | ORAL_TABLET | Freq: Every day | ORAL | Status: DC

## 2015-01-22 NOTE — Telephone Encounter (Signed)
Patient is requesting med refills on metFORMIN (GLUCOPHAGE) 1000 MG tablet and  benazepril (LOTENSIN) 10 MG tablet

## 2015-01-22 NOTE — Telephone Encounter (Signed)
Refill sent.

## 2015-02-07 DIAGNOSIS — M545 Low back pain: Secondary | ICD-10-CM | POA: Diagnosis not present

## 2015-02-07 DIAGNOSIS — M961 Postlaminectomy syndrome, not elsewhere classified: Secondary | ICD-10-CM | POA: Diagnosis not present

## 2015-02-07 DIAGNOSIS — Z9689 Presence of other specified functional implants: Secondary | ICD-10-CM | POA: Diagnosis not present

## 2015-02-07 DIAGNOSIS — G894 Chronic pain syndrome: Secondary | ICD-10-CM | POA: Diagnosis not present

## 2015-02-12 ENCOUNTER — Ambulatory Visit: Payer: Medicare HMO | Admitting: Family Medicine

## 2015-03-20 ENCOUNTER — Encounter: Payer: Self-pay | Admitting: Family Medicine

## 2015-03-20 ENCOUNTER — Ambulatory Visit (INDEPENDENT_AMBULATORY_CARE_PROVIDER_SITE_OTHER): Payer: Medicare HMO | Admitting: Family Medicine

## 2015-03-20 VITALS — BP 140/80 | HR 74 | Resp 16 | Ht 71.0 in | Wt 268.4 lb

## 2015-03-20 DIAGNOSIS — M5440 Lumbago with sciatica, unspecified side: Secondary | ICD-10-CM

## 2015-03-20 DIAGNOSIS — E118 Type 2 diabetes mellitus with unspecified complications: Secondary | ICD-10-CM

## 2015-03-20 DIAGNOSIS — E785 Hyperlipidemia, unspecified: Secondary | ICD-10-CM

## 2015-03-20 DIAGNOSIS — B351 Tinea unguium: Secondary | ICD-10-CM

## 2015-03-20 DIAGNOSIS — Z1159 Encounter for screening for other viral diseases: Secondary | ICD-10-CM

## 2015-03-20 DIAGNOSIS — Z23 Encounter for immunization: Secondary | ICD-10-CM | POA: Diagnosis not present

## 2015-03-20 DIAGNOSIS — Z125 Encounter for screening for malignant neoplasm of prostate: Secondary | ICD-10-CM

## 2015-03-20 DIAGNOSIS — N5201 Erectile dysfunction due to arterial insufficiency: Secondary | ICD-10-CM

## 2015-03-20 DIAGNOSIS — I10 Essential (primary) hypertension: Secondary | ICD-10-CM | POA: Diagnosis not present

## 2015-03-20 LAB — CBC
HCT: 44.7 % (ref 39.0–52.0)
Hemoglobin: 15.1 g/dL (ref 13.0–17.0)
MCH: 30.1 pg (ref 26.0–34.0)
MCHC: 33.8 g/dL (ref 30.0–36.0)
MCV: 89.2 fL (ref 78.0–100.0)
MPV: 10.6 fL (ref 8.6–12.4)
Platelets: 209 10*3/uL (ref 150–400)
RBC: 5.01 MIL/uL (ref 4.22–5.81)
RDW: 13.3 % (ref 11.5–15.5)
WBC: 7.6 10*3/uL (ref 4.0–10.5)

## 2015-03-20 LAB — BASIC METABOLIC PANEL WITH GFR
BUN: 14 mg/dL (ref 7–25)
CO2: 25 mmol/L (ref 20–31)
Calcium: 9.3 mg/dL (ref 8.6–10.3)
Chloride: 101 mmol/L (ref 98–110)
Creat: 0.87 mg/dL (ref 0.70–1.33)
GFR, Est African American: >89 mL/min (ref >=60)
GFR, Est Non African American: >89 mL/min (ref >=60)
Glucose, Bld: 248 mg/dL — ABNORMAL HIGH (ref 65–99)
Potassium: 4.2 mmol/L (ref 3.5–5.3)
Sodium: 136 mmol/L (ref 135–146)

## 2015-03-20 LAB — HEMOGLOBIN A1C
Hgb A1c MFr Bld: 7.9 % — ABNORMAL HIGH (ref <5.7)
Mean Plasma Glucose: 180 mg/dL — ABNORMAL HIGH (ref <117)

## 2015-03-20 LAB — HEPATITIS C ANTIBODY: HCV Ab: NEGATIVE

## 2015-03-20 MED ORDER — METFORMIN HCL 1000 MG PO TABS: ORAL_TABLET | ORAL | Status: DC

## 2015-03-20 MED ORDER — GLIPIZIDE ER 5 MG PO TB24: ORAL_TABLET | ORAL | Status: DC

## 2015-03-20 MED ORDER — GLIPIZIDE ER 2.5 MG PO TB24: ORAL_TABLET | ORAL | Status: DC

## 2015-03-20 MED ORDER — BENAZEPRIL HCL 10 MG PO TABS: 10.0000 mg | ORAL_TABLET | Freq: Every day | ORAL | Status: DC

## 2015-03-20 NOTE — Assessment & Plan Note (Signed)
Unchanged, pain management through pain clinic, controlled

## 2015-03-20 NOTE — Assessment & Plan Note (Signed)
Controlled, no change in medication  

## 2015-03-20 NOTE — Assessment & Plan Note (Signed)
Deteriorated. Patient re-educated about  the importance of commitment to a  minimum of 150 minutes of exercise per week.  The importance of healthy food choices with portion control discussed. Encouraged to start a food diary, count calories and to consider  joining a support group. Sample diet sheets offered. Goals set by the patient for the next several months.   Weight /BMI 03/20/2015 08/21/2014 02/20/2014  WEIGHT 268 lb 6.4 oz 266 lb 0.6 oz 263 lb 1.9 oz  HEIGHT  5' 10.5" 5' 10.5"  BMI 37.45 kg/m2 37.62 kg/m2 37.21 kg/m2    Current exercise per week 60 minutes.

## 2015-03-20 NOTE — Assessment & Plan Note (Signed)
Excellent response to treatment, also getting footcare through podiatry, which is great

## 2015-03-20 NOTE — Assessment & Plan Note (Signed)
Updated lab needed at/ before next visit. Pt elected to stop pravastatin after being advised by a friend/ relative , a nurse that this was a "bad  Drug"  Re educated re the benfits and indication for statins in diabetics, and asked him to reconsider this decision

## 2015-03-20 NOTE — Assessment & Plan Note (Signed)
Updated lab will do this today. Bryan Wright is reminded of the importance of commitment to daily physical activity for 30 minutes or more, as able and the need to limit carbohydrate intake to 30 to 60 grams per meal to help with blood sugar control.   The need to take medication as prescribed, test blood sugar as directed, and to call between visits if there is a concern that blood sugar is uncontrolled is also discussed.   Bryan Wright is reminded of the importance of daily foot exam, annual eye examination, and good blood sugar, blood pressure and cholesterol control.  Diabetic Labs Latest Ref Rng 08/21/2014 02/28/2014 09/18/2013 04/11/2013 08/05/2012  HbA1c <5.7 % 7.4(H) 7.1(H) 7.0(H) 7.4(H) 8.7(H)  Microalbumin 0.00 - 1.89 mg/dL - - 1.61 - 0.96  Micro/Creat Ratio 0.0 - 30.0 mg/g - - 7.9 - 9.9  Chol 125 - 200 mg/dL 045 - 409 - 811  HDL >=91 mg/dL 42 - 47(W) - 46  Calc LDL <130 mg/dL 295 - 92 - 621(H)  Triglycerides <150 mg/dL 99 - 96 - 086  Creatinine 0.70 - 1.33 mg/dL 5.78 4.69 6.29 5.28 4.13   BP/Weight 03/20/2015 08/21/2014 02/20/2014 09/18/2013 04/11/2013 08/04/2012 08/26/2010  Systolic BP 140 140 134 120 130 140 140  Diastolic BP 80 84 76 72 76 80 84  Wt. (Lbs) 268.4 266.04 263.12 263.08 263.08 258 273  BMI 37.45 37.62 37.21 37.2 37.2 36.48 38.6   Foot/eye exam completion dates Latest Ref Rng 03/20/2015 09/04/2014  Eye Exam No Retinopathy - No Retinopathy  Foot Form Completion - Done -

## 2015-03-20 NOTE — Assessment & Plan Note (Signed)
UnControlled, no change in medication DASH diet and commitment to daily physical activity for a minimum of 30 minutes discussed and encouraged, as a part of hypertension management. The importance of attaining a healthy weight is also discussed.  BP/Weight 03/20/2015 08/21/2014 02/20/2014 09/18/2013 04/11/2013 08/04/2012 08/26/2010  Systolic BP 140 140 134 120 130 140 140  Diastolic BP 80 84 76 72 76 80 84  Wt. (Lbs) 268.4 266.04 263.12 263.08 263.08 258 273  BMI 37.45 37.62 37.21 37.2 37.2 36.48 38.6

## 2015-03-20 NOTE — Patient Instructions (Signed)
Annual wellness in end August,, call if you need me sooner   Pneumonia vaccine today  Labs today Check feet every day, no sensation in right heel  Please commi t to walking and eat smaller portions , increase vegetables, blood pressure a bit higher than desored  Thanks for choosing [facility], we consider it a privelige to serve you.

## 2015-03-20 NOTE — Progress Notes (Signed)
Subjective:    Patient ID: Bryan Wright, male    DOB: [birth year], 60 y.o.   MRN: [id number]  HPI   Bryan Wright     MRN: [id number]      DOB: October 06, 1955   HPI Bryan Wright is here for follow up and re-evaluation of chronic medical conditions, medication management and review of any available recent lab and radiology data.  Preventive health is updated, specifically  Cancer screening and Immunization.   . The PT denies any adverse reactions to current medications since the last visit.  There are no new concerns.  There are no specific complaints   ROS Denies recent fever or chills. Denies sinus pressure, nasal congestion, ear pain or sore throat. Denies chest congestion, productive cough or wheezing. Denies chest pains, palpitations and leg swelling Denies abdominal pain, nausea, vomiting,diarrhea or constipation.   Denies dysuria, frequency, hesitancy or incontinence. Denies uncontrolled  joint pain, swelling and limitation in mobility. Denies headaches, seizures, numbness, or tingling. Denies depression, anxiety or insomnia. Denies skin break down or rash.   PE  BP 140/80 mmHg  Pulse 74  Resp 16  Ht  (1.803 m)  Wt 268 lb 6.4 oz (121.745 kg)  BMI 37.45 kg/m2  SpO2 98%  Patient alert and oriented and in no cardiopulmonary distress.  HEENT: No facial asymmetry, EOMI,   oropharynx pink and moist.  Neck supple no JVD, no mass.  Chest: Clear to auscultation bilaterally.  CVS: S1, S2 no murmurs, no S3.Regular rate.  ABD: Soft non tender.   Ext: No edema  MS: Adequate though reduced  ROM spine,  Normal in shoulders, hips and knees.  Skin: Intact, no ulcerations or rash noted.  Psych: Good eye contact, normal affect. Memory intact not anxious or depressed appearing.  CNS: CN 2-12 intact, power,  normal throughout.no focal deficits noted.   Assessment & Plan   Essential hypertension UnControlled, no change in medication DASH diet and  commitment to daily physical activity for a minimum of 30 minutes discussed and encouraged, as a part of hypertension management. The importance of attaining a healthy weight is also discussed.  BP/Weight 03/20/2015 08/21/2014 02/20/2014 09/18/2013 04/11/2013 08/04/2012 08/26/2010  Systolic BP 140 140 134 120 130 140 140  Diastolic BP 80 84 76 72 76 80 84  Wt. (Lbs) 268.4 266.04 263.12 263.08 263.08 258 273  BMI 37.45 37.62 37.21 37.2 37.2 36.48 38.6        DM type 2 (diabetes mellitus, type 2) Updated lab will do this today. Bryan Wright is reminded of the importance of commitment to daily physical activity for 30 minutes or more, as able and the need to limit carbohydrate intake to 30 to 60 grams per meal to help with blood sugar control.   The need to take medication as prescribed, test blood sugar as directed, and to call between visits if there is a concern that blood sugar is uncontrolled is also discussed.   Bryan Wright is reminded of the importance of daily foot exam, annual eye examination, and good blood sugar, blood pressure and cholesterol control.  Diabetic Labs Latest Ref Rng 08/21/2014 02/28/2014 09/18/2013 04/11/2013 08/05/2012  HbA1c <5.7 % 7.4(H) 7.1(H) 7.0(H) 7.4(H) 8.7(H)  Microalbumin 0.00 - 1.89 mg/dL - - 7.82 - 9.56  Micro/Creat Ratio 0.0 - 30.0 mg/g - - 7.9 - 9.9  Chol 125 - 200 mg/dL 213 - 086 - 578  HDL >=46 mg/dL 42 - 96(E) - 46  Calc LDL <130  mg/dL 604 - 92 - 540(J)  Triglycerides <150 mg/dL 99 - 96 - 811  Creatinine 0.70 - 1.33 mg/dL 9.14 7.82 9.56 2.13 0.86   BP/Weight 03/20/2015 08/21/2014 02/20/2014 09/18/2013 04/11/2013 08/04/2012 08/26/2010  Systolic BP 140 140 134 120 130 140 140  Diastolic BP 80 84 76 72 76 80 84  Wt. (Lbs) 268.4 266.04 263.12 263.08 263.08 258 273  BMI 37.45 37.62 37.21 37.2 37.2 36.48 38.6   Foot/eye exam completion dates Latest Ref Rng 03/20/2015 09/04/2014  Eye Exam No Retinopathy - No Retinopathy  Foot Form Completion - Done -          Onychomycosis Excellent response to treatment, also getting footcare through podiatry, which is great  Erectile dysfunction Controlled, no change in medication   Morbid obesity Deteriorated. Patient re-educated about  the importance of commitment to a  minimum of 150 minutes of exercise per week.  The importance of healthy food choices with portion control discussed. Encouraged to start a food diary, count calories and to consider  joining a support group. Sample diet sheets offered. Goals set by the patient for the next several months.   Weight /BMI 03/20/2015 08/21/2014 02/20/2014  WEIGHT 268 lb 6.4 oz 266 lb 0.6 oz 263 lb 1.9 oz  HEIGHT  5' 10.5" 5' 10.5"  BMI 37.45 kg/m2 37.62 kg/m2 37.21 kg/m2    Current exercise per week 60 minutes.   Dyslipidemia, goal LDL below 100 Updated lab needed at/ before next visit. Pt elected to stop pravastatin after being advised by a friend/ relative , a nurse that this was a "bad  Drug"  Re educated re the benfits and indication for statins in diabetics, and asked him to reconsider this decision  Backache Unchanged, pain management through pain clinic, controlled      Review of Systems     Objective:   Physical Exam        Assessment & Plan:

## 2015-03-21 LAB — PSA, MEDICARE: PSA: 2.29 ng/mL (ref <=4.00)

## 2015-03-21 LAB — TSH: TSH: 0.72 mIU/L (ref 0.40–4.50)

## 2015-03-22 MED ORDER — GLIPIZIDE ER 10 MG PO TB24: 10.0000 mg | ORAL_TABLET | Freq: Every day | ORAL | Status: DC

## 2015-03-22 NOTE — Addendum Note (Signed)
Addended by: Abner GreenspanHUDY, Tranell Wojtkiewicz H on: 03/22/2015 02:09 PM   Modules accepted: Orders, Medications

## 2015-04-02 ENCOUNTER — Other Ambulatory Visit: Payer: Self-pay

## 2015-04-02 DIAGNOSIS — N528 Other male erectile dysfunction: Secondary | ICD-10-CM

## 2015-04-02 MED ORDER — TADALAFIL 20 MG PO TABS: 20.0000 mg | ORAL_TABLET | Freq: Every day | ORAL | Status: DC | PRN

## 2015-04-22 ENCOUNTER — Other Ambulatory Visit: Payer: Self-pay

## 2015-04-22 DIAGNOSIS — R69 Illness, unspecified: Secondary | ICD-10-CM | POA: Diagnosis not present

## 2015-04-22 MED ORDER — GLUCOSE BLOOD VI STRP: ORAL_STRIP | Status: DC

## 2015-04-29 DIAGNOSIS — E119 Type 2 diabetes mellitus without complications: Secondary | ICD-10-CM | POA: Diagnosis not present

## 2015-05-24 DIAGNOSIS — M545 Low back pain: Secondary | ICD-10-CM | POA: Diagnosis not present

## 2015-05-24 DIAGNOSIS — Z79899 Other long term (current) drug therapy: Secondary | ICD-10-CM | POA: Diagnosis not present

## 2015-05-24 DIAGNOSIS — M5416 Radiculopathy, lumbar region: Secondary | ICD-10-CM | POA: Diagnosis not present

## 2015-05-24 DIAGNOSIS — G894 Chronic pain syndrome: Secondary | ICD-10-CM | POA: Diagnosis not present

## 2015-05-24 DIAGNOSIS — Z5181 Encounter for therapeutic drug level monitoring: Secondary | ICD-10-CM | POA: Diagnosis not present

## 2015-05-24 DIAGNOSIS — M961 Postlaminectomy syndrome, not elsewhere classified: Secondary | ICD-10-CM | POA: Diagnosis not present

## 2015-06-08 ENCOUNTER — Other Ambulatory Visit: Payer: Self-pay | Admitting: Family Medicine

## 2015-06-18 ENCOUNTER — Other Ambulatory Visit: Payer: Self-pay | Admitting: Family Medicine

## 2015-08-16 DIAGNOSIS — G894 Chronic pain syndrome: Secondary | ICD-10-CM | POA: Diagnosis not present

## 2015-08-16 DIAGNOSIS — M961 Postlaminectomy syndrome, not elsewhere classified: Secondary | ICD-10-CM | POA: Diagnosis not present

## 2015-08-16 DIAGNOSIS — M5416 Radiculopathy, lumbar region: Secondary | ICD-10-CM | POA: Diagnosis not present

## 2015-08-16 DIAGNOSIS — M545 Low back pain: Secondary | ICD-10-CM | POA: Diagnosis not present

## 2015-11-05 DIAGNOSIS — M5416 Radiculopathy, lumbar region: Secondary | ICD-10-CM | POA: Diagnosis not present

## 2015-11-05 DIAGNOSIS — M545 Low back pain: Secondary | ICD-10-CM | POA: Diagnosis not present

## 2015-11-05 DIAGNOSIS — M961 Postlaminectomy syndrome, not elsewhere classified: Secondary | ICD-10-CM | POA: Diagnosis not present

## 2015-11-05 DIAGNOSIS — G894 Chronic pain syndrome: Secondary | ICD-10-CM | POA: Diagnosis not present

## 2015-12-06 DIAGNOSIS — R69 Illness, unspecified: Secondary | ICD-10-CM | POA: Diagnosis not present

## 2015-12-19 ENCOUNTER — Encounter: Payer: Self-pay | Admitting: Family Medicine

## 2015-12-19 ENCOUNTER — Ambulatory Visit (INDEPENDENT_AMBULATORY_CARE_PROVIDER_SITE_OTHER): Payer: Medicare HMO | Admitting: Family Medicine

## 2015-12-19 VITALS — BP 120/62 | HR 84 | Temp 97.8°F | Resp 18 | Ht 71.0 in | Wt 260.0 lb

## 2015-12-19 DIAGNOSIS — Z0000 Encounter for general adult medical examination without abnormal findings: Secondary | ICD-10-CM | POA: Diagnosis not present

## 2015-12-19 DIAGNOSIS — E118 Type 2 diabetes mellitus with unspecified complications: Secondary | ICD-10-CM | POA: Diagnosis not present

## 2015-12-19 DIAGNOSIS — Z125 Encounter for screening for malignant neoplasm of prostate: Secondary | ICD-10-CM

## 2015-12-19 DIAGNOSIS — I10 Essential (primary) hypertension: Secondary | ICD-10-CM

## 2015-12-19 DIAGNOSIS — J209 Acute bronchitis, unspecified: Secondary | ICD-10-CM | POA: Diagnosis not present

## 2015-12-19 DIAGNOSIS — E785 Hyperlipidemia, unspecified: Secondary | ICD-10-CM

## 2015-12-19 DIAGNOSIS — Z1211 Encounter for screening for malignant neoplasm of colon: Secondary | ICD-10-CM

## 2015-12-19 LAB — POC HEMOCCULT BLD/STL (OFFICE/1-CARD/DIAGNOSTIC): Fecal Occult Blood, POC: NEGATIVE

## 2015-12-19 MED ORDER — BENZONATATE 100 MG PO CAPS: 100.0000 mg | ORAL_CAPSULE | Freq: Two times a day (BID) | ORAL | 0 refills | Status: DC | PRN

## 2015-12-19 MED ORDER — PENICILLIN V POTASSIUM 500 MG PO TABS: 500.0000 mg | ORAL_TABLET | Freq: Three times a day (TID) | ORAL | 0 refills | Status: DC

## 2015-12-19 MED ORDER — CEFTRIAXONE SODIUM 500 MG IJ SOLR
500.0000 mg | Freq: Once | INTRAMUSCULAR | Status: AC
  Administered 2015-12-19: 500 mg via INTRAMUSCULAR

## 2015-12-19 NOTE — Patient Instructions (Signed)
Wellness visit in 4 month, call if you need me before  Exam is good except for acute bronchitis  No hidden blood in stool, STILL NEED YOUR COLONOSCOPY!  Rocephin in office for acute bronchitis, and penicillin and decongestants sent in  Fasting labs tomorrow  Please work on good  health habits so that your health will improve. 1. Commitment to daily physical activity for 30 to 60  minutes, if you are able to do this.  2. Commitment to wise food choices. Aim for half of your  food intake to be vegetable and fruit, one quarter starchy foods, and one quarter protein. Try to eat on a regular schedule  3 meals per day, snacking between meals should be limited to vegetables or fruits or small portions of nuts. 64 ounces of water per day is generally recommended, unless you have specific health conditions, like heart failure or kidney failure where you will need to limit fluid intake.  3. Commitment to sufficient and a  good quality of physical and mental rest daily, generally between 6 to 8 hours per day.  WITH PERSISTANCE AND PERSEVERANCE, THE IMPOSSIBLE , BECOMES THE NORM!  Thank you  for choosing [facility]. We consider it a privelige to serve you.  Delivering excellent health care in a caring and  compassionate way is our goal.  Partnering with you,  so that together we can achieve this goal is our strategy.

## 2015-12-19 NOTE — Assessment & Plan Note (Addendum)
Rocephin in office , and penicillin and decongestant prescribed

## 2015-12-22 ENCOUNTER — Encounter: Payer: Self-pay | Admitting: Family Medicine

## 2015-12-22 NOTE — Progress Notes (Signed)
Bryan Wright     MRN: [id number]      DOB: 07-31-1955   HPI: Patient is in for annual physical exam. 1 week h/o cough and chest congestion with chills , fever and yellow sputum, denies sinus pressure, nasal drainage , ear pain or sore throat Reports excellent blood sugar control Denies polyuria, polydipsia, blurred vision , or hypoglycemic episodes. Will obtain fasting labs in am     PE; BP 120/62 (BP Location: Left Arm, Patient Position: Sitting, Cuff Size: Large)   Pulse 84   Temp 97.8 F (36.6 C) (Oral)   Resp 18   Ht 5\' 11"  (1.803 m)   Wt 260 lb 0.6 oz (118 kg)   SpO2 95%   BMI 36.27 kg/m   Pleasant male, alert and oriented x 3, in mild -pulmonary distress. Afebrile. HEENT No facial trauma or asymetry. Sinuses non tender. EOMI, pupils equally reactive to light. External ears normal, tympanic membranes clear. Oropharynx moist, no exudate. Neck: supple, no adenopathy,JVD or thyromegaly.No bruits.  Chest: Decreased though adequate air entry, bilateral crackles and few wheezes Non tender to palpation  Breast: No asymetry,no masses. No nipple discharge or inversion. No axillary or supraclavicular adenopathy  Cardiovascular system; Heart sounds normal,  S1 and  S2 ,no S3.  No murmur, or thrill. Apical beat not displaced Peripheral pulses normal.  Abdomen: Soft, non tender, no organomegaly or masses. No bruits. Bowel sounds normal. No guarding, tenderness or rebound.  Rectal:  Normal sphincter tone. No hemorrhoids or  masses. guaiac negative stool. Prostate smooth and firm    Musculoskeletal exam: Decreased though adequate  ROM of spine, normal in hips , shoulders and knees. No deformity ,swelling or crepitus noted. No muscle wasting or atrophy.   Neurologic: Cranial nerves 2 to 12 intact. Power, tone ,sensation and reflexes normal throughout. No disturbance in gait. No tremor.  Skin: Intact, no ulceration, erythema , scaling or rash  noted. Pigmentation normal throughout  Psych; Normal mood and affect. Judgement and concentration normal   Assessment & Plan:  Acute bronchitis Rocephin in office , and penicillin and decongestant prescribed  Annual physical exam Annual exam as documented.   DM type 2 (diabetes mellitus, type 2) Updated lab needed at/ before next visit. Mr. Bryan Wright is reminded of the importance of commitment to daily physical activity for 30 minutes or more, as able and the need to limit carbohydrate intake to 30 to 60 grams per meal to help with blood sugar control.   The need to take medication as prescribed, test blood sugar as directed, and to call between visits if there is a concern that blood sugar is uncontrolled is also discussed.   Mr. Bryan Wright is reminded of the importance of daily foot exam, annual eye examination, and good blood sugar, blood pressure and cholesterol control.  Diabetic Labs Latest Ref Rng & Units 03/20/2015 08/21/2014 02/28/2014 09/18/2013 04/11/2013  HbA1c <5.7 % 7.9(H) 7.4(H) 7.1(H) 7.0(H) 7.4(H)  Microalbumin 0.00 - 1.89 mg/dL - - - 8.291.23 -  Micro/Creat Ratio 0.0 - 30.0 mg/g - - - 7.9 -  Chol 125 - 200 mg/dL - 562180 - 130149 -  HDL >=86>=40 mg/dL - 42 - 57(Q38(L) -  Calc LDL <130 mg/dL - 469118 - 92 -  Triglycerides <150 mg/dL - 99 - 96 -  Creatinine 0.70 - 1.33 mg/dL 6.290.87 5.280.78 4.130.78 2.440.72 0.100.77   BP/Weight 12/19/2015 03/20/2015 08/21/2014 02/20/2014 09/18/2013 04/11/2013 08/04/2012  Systolic BP 120 140 140 134 120 130 140  Diastolic BP 62 80 84 76 72 76 80  Wt. (Lbs) 260.04 268.4 266.04 263.12 263.08 263.08 258  BMI 36.27 37.45 37.62 37.21 37.2 37.2 36.48   Foot/eye exam completion dates Latest Ref Rng & Units 03/20/2015 09/04/2014  Eye Exam No Retinopathy - No Retinopathy  Foot Form Completion - Done -

## 2015-12-22 NOTE — Assessment & Plan Note (Signed)
Updated lab needed at/ before next visit. Mr. Bryan Wright is reminded of the importance of commitment to daily physical activity for 30 minutes or more, as able and the need to limit carbohydrate intake to 30 to 60 grams per meal to help with blood sugar control.   The need to take medication as prescribed, test blood sugar as directed, and to call between visits if there is a concern that blood sugar is uncontrolled is also discussed.   Mr. Bryan Wright is reminded of the importance of daily foot exam, annual eye examination, and good blood sugar, blood pressure and cholesterol control.  Diabetic Labs Latest Ref Rng & Units 03/20/2015 08/21/2014 02/28/2014 09/18/2013 04/11/2013  HbA1c <5.7 % 7.9(H) 7.4(H) 7.1(H) 7.0(H) 7.4(H)  Microalbumin 0.00 - 1.89 mg/dL - - - 1.611.23 -  Micro/Creat Ratio 0.0 - 30.0 mg/g - - - 7.9 -  Chol 125 - 200 mg/dL - 096180 - 045149 -  HDL >=40>=40 mg/dL - 42 - 98(J38(L) -  Calc LDL <130 mg/dL - 191118 - 92 -  Triglycerides <150 mg/dL - 99 - 96 -  Creatinine 0.70 - 1.33 mg/dL 4.780.87 2.950.78 6.210.78 3.080.72 6.570.77   BP/Weight 12/19/2015 03/20/2015 08/21/2014 02/20/2014 09/18/2013 04/11/2013 08/04/2012  Systolic BP 120 140 140 134 120 130 140  Diastolic BP 62 80 84 76 72 76 80  Wt. (Lbs) 260.04 268.4 266.04 263.12 263.08 263.08 258  BMI 36.27 37.45 37.62 37.21 37.2 37.2 36.48   Foot/eye exam completion dates Latest Ref Rng & Units 03/20/2015 09/04/2014  Eye Exam No Retinopathy - No Retinopathy  Foot Form Completion - Done -

## 2015-12-22 NOTE — Assessment & Plan Note (Signed)
Annual exam as documented. 

## 2015-12-24 DIAGNOSIS — Z125 Encounter for screening for malignant neoplasm of prostate: Secondary | ICD-10-CM | POA: Diagnosis not present

## 2015-12-24 DIAGNOSIS — E785 Hyperlipidemia, unspecified: Secondary | ICD-10-CM | POA: Diagnosis not present

## 2015-12-24 DIAGNOSIS — E118 Type 2 diabetes mellitus with unspecified complications: Secondary | ICD-10-CM | POA: Diagnosis not present

## 2015-12-25 LAB — COMPLETE METABOLIC PANEL WITH GFR
ALT: 28 U/L (ref 9–46)
AST: 33 U/L (ref 10–35)
Albumin: 3.8 g/dL (ref 3.6–5.1)
Alkaline Phosphatase: 54 U/L (ref 40–115)
BUN: 10 mg/dL (ref 7–25)
CO2: 29 mmol/L (ref 20–31)
Calcium: 9 mg/dL (ref 8.6–10.3)
Chloride: 99 mmol/L (ref 98–110)
Creat: 0.87 mg/dL (ref 0.70–1.33)
GFR, Est African American: >89 mL/min (ref >=60)
GFR, Est Non African American: >89 mL/min (ref >=60)
Glucose, Bld: 110 mg/dL — ABNORMAL HIGH (ref 65–99)
Potassium: 3.9 mmol/L (ref 3.5–5.3)
Sodium: 137 mmol/L (ref 135–146)
Total Bilirubin: 0.5 mg/dL (ref 0.2–1.2)
Total Protein: 7.1 g/dL (ref 6.1–8.1)

## 2015-12-25 LAB — LIPID PANEL
Cholesterol: 149 mg/dL (ref <200)
HDL: 33 mg/dL — ABNORMAL LOW (ref >40)
LDL Cholesterol: 97 mg/dL (ref <100)
Total CHOL/HDL Ratio: 4.5 Ratio (ref <5.0)
Triglycerides: 94 mg/dL (ref <150)
VLDL: 19 mg/dL (ref <30)

## 2015-12-25 LAB — VITAMIN D 25 HYDROXY (VIT D DEFICIENCY, FRACTURES): Vit D, 25-Hydroxy: 21 ng/mL — ABNORMAL LOW (ref 30–100)

## 2015-12-25 LAB — HEMOGLOBIN A1C
Hgb A1c MFr Bld: 7.8 % — ABNORMAL HIGH (ref <5.7)
Mean Plasma Glucose: 177 mg/dL

## 2015-12-25 LAB — PSA: PSA: 3.2 ng/mL (ref <=4.0)

## 2015-12-27 ENCOUNTER — Other Ambulatory Visit: Payer: Self-pay

## 2015-12-27 MED ORDER — VITAMIN D (ERGOCALCIFEROL) 1.25 MG (50000 UNIT) PO CAPS: 50000.0000 [IU] | ORAL_CAPSULE | ORAL | 5 refills | Status: DC

## 2015-12-27 NOTE — Progress Notes (Signed)
Vitamin d sent to pharmacy

## 2016-01-02 ENCOUNTER — Other Ambulatory Visit: Payer: Self-pay | Admitting: Family Medicine

## 2016-01-06 ENCOUNTER — Other Ambulatory Visit: Payer: Self-pay

## 2016-01-06 MED ORDER — METFORMIN HCL 1000 MG PO TABS: ORAL_TABLET | ORAL | 0 refills | Status: DC

## 2016-02-12 DIAGNOSIS — M961 Postlaminectomy syndrome, not elsewhere classified: Secondary | ICD-10-CM | POA: Diagnosis not present

## 2016-02-12 DIAGNOSIS — M545 Low back pain: Secondary | ICD-10-CM | POA: Diagnosis not present

## 2016-02-12 DIAGNOSIS — G894 Chronic pain syndrome: Secondary | ICD-10-CM | POA: Diagnosis not present

## 2016-02-12 DIAGNOSIS — M5416 Radiculopathy, lumbar region: Secondary | ICD-10-CM | POA: Diagnosis not present

## 2016-03-24 ENCOUNTER — Other Ambulatory Visit: Payer: Self-pay

## 2016-03-24 DIAGNOSIS — N528 Other male erectile dysfunction: Secondary | ICD-10-CM

## 2016-03-24 MED ORDER — TADALAFIL 20 MG PO TABS: 20.0000 mg | ORAL_TABLET | Freq: Every day | ORAL | 2 refills | Status: DC | PRN

## 2016-03-26 ENCOUNTER — Other Ambulatory Visit: Payer: Self-pay | Admitting: Family Medicine

## 2016-03-26 ENCOUNTER — Other Ambulatory Visit: Payer: Self-pay

## 2016-03-26 MED ORDER — SILDENAFIL CITRATE 20 MG PO TABS: ORAL_TABLET | ORAL | 0 refills | Status: DC

## 2016-04-14 ENCOUNTER — Ambulatory Visit: Payer: Medicare HMO

## 2016-04-15 ENCOUNTER — Other Ambulatory Visit: Payer: Self-pay

## 2016-04-15 ENCOUNTER — Ambulatory Visit (INDEPENDENT_AMBULATORY_CARE_PROVIDER_SITE_OTHER): Payer: Medicare HMO

## 2016-04-15 VITALS — BP 142/76 | HR 83 | Temp 98.0°F | Ht 71.0 in | Wt 260.0 lb

## 2016-04-15 DIAGNOSIS — Z1211 Encounter for screening for malignant neoplasm of colon: Secondary | ICD-10-CM | POA: Diagnosis not present

## 2016-04-15 DIAGNOSIS — Z0000 Encounter for general adult medical examination without abnormal findings: Secondary | ICD-10-CM

## 2016-04-15 MED ORDER — BENAZEPRIL HCL 10 MG PO TABS: ORAL_TABLET | ORAL | 1 refills | Status: DC

## 2016-04-15 MED ORDER — METFORMIN HCL 1000 MG PO TABS: ORAL_TABLET | ORAL | 1 refills | Status: DC

## 2016-04-15 MED ORDER — GLIPIZIDE ER 10 MG PO TB24: 10.0000 mg | ORAL_TABLET | Freq: Every day | ORAL | 1 refills | Status: DC

## 2016-04-15 NOTE — Progress Notes (Signed)
Subjective:   Bryan Wright is a 61 y.o. male who presents for an Initial Medicare Annual Wellness Visit.  Review of Systems  Cardiac Risk Factors include: advanced age (>61men, >24 women);diabetes mellitus;dyslipidemia;hypertension;male gender;obesity (BMI >30kg/m2)    Objective:    Today's Vitals   04/15/16 1427  BP: (!) 142/76  Pulse: 83  Temp: 98 F (36.7 C)  TempSrc: Oral  SpO2: 93%  Weight: 260 lb (117.9 kg)  Height: 5\' 11"  (1.803 m)   Body mass index is 36.26 kg/m.  Current Medications (verified) Outpatient Encounter Prescriptions as of 04/15/2016  Medication Sig  . benazepril (LOTENSIN) 10 MG tablet TAKE 1 TABLET(10 MG) BY MOUTH DAILY  . benzonatate (TESSALON) 100 MG capsule Take 1 capsule (100 mg total) by mouth 2 (two) times daily as needed for cough.  . fentaNYL (DURAGESIC - DOSED MCG/HR) 50 MCG/HR Place 50 mcg onto the skin every 3 (three) days.  Marland Kitchen GLIPIZIDE XL 10 MG 24 hr tablet TAKE 1 TABLET BY MOUTH DAILY WITH BREAKFAST  . glucose blood (ACCU-CHEK AVIVA PLUS) test strip Once daily testing  . Hydrocodone-Acetaminophen (VICODIN HP) 10-300 MG TABS to 2 tablets every 6 hours as needed Gets 365b tabs per 3 months, from pain clinic in [place] Nerve stimulator implanted in 2012 for chronic back pain with failed back surgery x 2  . metFORMIN (GLUCOPHAGE) 1000 MG tablet TAKE 1 TABLET BY MOUTH TWICE DAILY WITH A MEAL  . Multiple Vitamin (THERA) TABS Take 2 tablets by mouth daily.  . penicillin v potassium (VEETID) 500 MG tablet Take 1 tablet (500 mg total) by mouth 3 (three) times daily.  . sildenafil (REVATIO) 20 MG tablet DISSOLVE ONE TROCHE UNDERNEATH THE TONGUE 60 MINUTES PRIOR TO SEXUAL ENCOUNTER.  Marland Kitchen tadalafil (CIALIS) 20 MG tablet Take 1 tablet (20 mg total) by mouth daily as needed for erectile dysfunction.  . Vitamin D, Ergocalciferol, (DRISDOL) 50000 units CAPS capsule Take 1 capsule (50,000 Units total) by mouth every 7 (seven) days.   No  facility-administered encounter medications on file as of 04/15/2016.     Allergies (verified) Patient has no known allergies.   History: Past Medical History:  Diagnosis Date  . Arthritis   . Diabetes mellitus dx in 2008  . Hypertension 2008   Past Surgical History:  Procedure Laterality Date  . APPENDECTOMY     early 20's  . CERVICAL DISC SURGERY    . LUNG SURGERY    . SPINE SURGERY  2002   cervical  2002  . SPINE SURGERY  2009, 2010   lumbar   History reviewed. No pertinent family history. Social History   Occupational History  . Not on file.   Social History Main Topics  . Smoking status: Former Smoker    Packs/day: 1.50    Years: 5.00    Types: Cigarettes    Quit date: 45  . Smokeless tobacco: Former Neurosurgeon    Types: Chew    Quit date: 06/18/2015  . Alcohol use No  . Drug use: No  . Sexual activity: Yes   Tobacco Counseling Counseling given: Not Answered   Activities of Daily Living In your present state of health, do you have any difficulty performing the following activities: 04/15/2016 12/19/2015  Hearing? N N  Vision? N N  Difficulty concentrating or making decisions? N N  Walking or climbing stairs? N N  Dressing or bathing? N N  Doing errands, shopping? N N  Preparing Food and eating ? N -  Using the Toilet? N -  In the past six months, have you accidently leaked urine? N -  Do you have problems with loss of bowel control? N -  Managing your Medications? N -  Managing your Finances? N -  Housekeeping or managing your Housekeeping? N -  Some recent data might be hidden    Immunizations and Health Maintenance Immunization History  Administered Date(s) Administered  . Influenza Whole 12/02/2009  . Influenza,inj,Quad PF,36+ Mos 09/18/2013, 03/20/2015  . Pneumococcal Polysaccharide-23 12/02/2009, 03/20/2015   Health Maintenance Due  Topic Date Due  . TETANUS/TDAP  01/10/1977  . COLONOSCOPY  01/11/2008  . OPHTHALMOLOGY EXAM  09/04/2015  .  FOOT EXAM  03/19/2016    Patient Care Team: Kerri PerchesMargaret E Simpson, MD as PCP - General Jethro BolusMark Shapiro, MD as Consulting Physician (Ophthalmology) Cannon Kettlehristopher Gilmore, MD as Referring Physician (Pain Medicine)  Indicate any recent Medical Services you may have received from other than [facility] providers in the past year (date may be approximate).    Assessment:   This is a routine wellness examination for Bryan Wright.  Hearing/Vision screen No exam data present  Dietary issues and exercise activities discussed: Current Exercise Habits: Home exercise routine, Type of exercise: walking, Time (Minutes): 30, Frequency (Times/Week): 3, Weekly Exercise (Minutes/Week): 90, Intensity: Mild, Exercise limited by: None identified  Goals    . Increase water intake          Increase your water intake 64 ounces a day.       Depression Screen PHQ 2/9 Scores 04/15/2016 12/19/2015 09/18/2013 08/04/2012  PHQ - 2 Score 0 0 0 0    Fall Risk Fall Risk  04/15/2016 12/19/2015 09/18/2013 08/04/2012  Falls in the past year? No No No No    Cognitive Function:     6CIT Screen 04/15/2016  What Year? 0 points  What month? 0 points  What time? 0 points  Count back from 20 0 points  Months in reverse 0 points  Repeat phrase 0 points  Total Score 0    Screening Tests Health Maintenance  Topic Date Due  . TETANUS/TDAP  01/10/1977  . COLONOSCOPY  01/11/2008  . OPHTHALMOLOGY EXAM  09/04/2015  . FOOT EXAM  03/19/2016  . HEMOGLOBIN A1C  06/23/2016  . PNEUMOCOCCAL POLYSACCHARIDE VACCINE  Completed  . INFLUENZA VACCINE  Completed  . Hepatitis C Screening  Completed  . HIV Screening  Completed        Plan:  I have personally reviewed and addressed the Medicare Annual Wellness questionnaire and have noted the following in the patient's chart:  A. Medical and social history B. Use of alcohol, tobacco or illicit drugs  C. Current medications and supplements D. Functional ability and status E.  Nutritional  status F.  Physical activity G. Advance directives H. List of other physicians I.  Hospitalizations, surgeries, and ER visits in previous 12 months J.  Vitals K. Screenings to include cognitive, depression, and falls L. Referrals and appointments - Referral sent to Dr. Jonette EvaSandi Fields for a colonoscopy consult.  In addition, I have reviewed and discussed with patient certain preventive protocols, quality metrics, and best practice recommendations. A written personalized care plan for preventive services as well as general preventive health recommendations were provided to patient.  Signed,   Candis ShineKrystal Harrison, LPN Lead Nurse Health Advisor

## 2016-04-15 NOTE — Patient Instructions (Addendum)
Bryan Wright , Thank you for taking time to come for your Medicare Wellness Visit. I appreciate your ongoing commitment to your health goals. Please review the following plan we discussed and let me know if I can assist you in the future.   Screening recommendations/referrals: Colonoscopy: Due now, referral sent in to Dr. Barney Drain for a consult appointment. Diabetic Eye Exam: Up to date, will request records Recommended yearly dental visit for hygiene and checkup  Vaccinations: Influenza vaccine: Up to date, next due 09/2016 Pneumococcal vaccine: Due at age 69 Tdap vaccine: Due now, contact your insurance company to see if this a covered vaccine for you. Shingles vaccine: Due at age 45    Advanced directives: Advance directive discussed with you today. Even though you declined this today you can call our office at any time should you change your mind and we can provide you with the paperwork.  Conditions/risks identified: Obese, recommend continuing on your current exercise program and try to increase your water intake 64 ounces a day.  Next appointment: Follow up in one week with Dr. Moshe Cipro. Follow up in 1 year for your annual wellness visit.   Preventive Care 40-64 Years, Male Preventive care refers to lifestyle choices and visits with your health care provider that can promote health and wellness. What does preventive care include?  A yearly physical exam. This is also called an annual well check.  Dental exams once or twice a year.  Routine eye exams. Ask your health care provider how often you should have your eyes checked.  Personal lifestyle choices, including:  Daily care of your teeth and gums.  Regular physical activity.  Eating a healthy diet.  Avoiding tobacco and drug use.  Limiting alcohol use.  Practicing safe sex.  Taking low-dose aspirin every day starting at age 74. What happens during an annual well check? The services and screenings done by your  health care provider during your annual well check will depend on your age, overall health, lifestyle risk factors, and family history of disease. Counseling  Your health care provider may ask you questions about your:  Alcohol use.  Tobacco use.  Drug use.  Emotional well-being.  Home and relationship well-being.  Sexual activity.  Eating habits.  Work and work Statistician. Screening  You may have the following tests or measurements:  Height, weight, and BMI.  Blood pressure.  Lipid and cholesterol levels. These may be checked every 5 years, or more frequently if you are over 53 years old.  Skin check.  Lung cancer screening. You may have this screening every year starting at age 65 if you have a 30-pack-year history of smoking and currently smoke or have quit within the past 15 years.  Fecal occult blood test (FOBT) of the stool. You may have this test every year starting at age 15.  Flexible sigmoidoscopy or colonoscopy. You may have a sigmoidoscopy every 5 years or a colonoscopy every 10 years starting at age 92.  Prostate cancer screening. Recommendations will vary depending on your family history and other risks.  Hepatitis C blood test.  Hepatitis B blood test.  Sexually transmitted disease (STD) testing.  Diabetes screening. This is done by checking your blood sugar (glucose) after you have not eaten for a while (fasting). You may have this done every 1-3 years. Discuss your test results, treatment options, and if necessary, the need for more tests with your health care provider. Vaccines  Your health care provider may recommend certain vaccines,  such as:  Influenza vaccine. This is recommended every year.  Tetanus, diphtheria, and acellular pertussis (Tdap, Td) vaccine. You may need a Td booster every 10 years.  Zoster vaccine. You may need this after age 78.  Pneumococcal 13-valent conjugate (PCV13) vaccine. You may need this if you have certain  conditions and have not been vaccinated.  Pneumococcal polysaccharide (PPSV23) vaccine. You may need one or two doses if you smoke cigarettes or if you have certain conditions. Talk to your health care provider about which screenings and vaccines you need and how often you need them. This information is not intended to replace advice given to you by your health care provider. Make sure you discuss any questions you have with your health care provider. Document Released: 02/01/2015 Document Revised: 09/25/2015 Document Reviewed: 11/06/2014 Elsevier Interactive Patient Education  2017 [place] Prevention in the Home Falls can cause injuries. They can happen to people of all ages. There are many things you can do to make your home safe and to help prevent falls. What can I do on the outside of my home?  Regularly fix the edges of walkways and driveways and fix any cracks.  Remove anything that might make you trip as you walk through a door, such as a raised step or threshold.  Trim any bushes or trees on the path to your home.  Use bright outdoor lighting.  Clear any walking paths of anything that might make someone trip, such as rocks or tools.  Regularly check to see if handrails are loose or broken. Make sure that both sides of any steps have handrails.  Any raised decks and porches should have guardrails on the edges.  Have any leaves, snow, or ice cleared regularly.  Use sand or salt on walking paths during winter.  Clean up any spills in your garage right away. This includes oil or grease spills. What can I do in the bathroom?  Use night lights.  Install grab bars by the toilet and in the tub and shower. Do not use towel bars as grab bars.  Use non-skid mats or decals in the tub or shower.  If you need to sit down in the shower, use a plastic, non-slip stool.  Keep the floor dry. Clean up any water that spills on the floor as soon as it happens.  Remove soap  buildup in the tub or shower regularly.  Attach bath mats securely with double-sided non-slip rug tape.  Do not have throw rugs and other things on the floor that can make you trip. What can I do in the bedroom?  Use night lights.  Make sure that you have a light by your bed that is easy to reach.  Do not use any sheets or blankets that are too big for your bed. They should not hang down onto the floor.  Have a firm chair that has side arms. You can use this for support while you get dressed.  Do not have throw rugs and other things on the floor that can make you trip. What can I do in the kitchen?  Clean up any spills right away.  Avoid walking on wet floors.  Keep items that you use a lot in easy-to-reach places.  If you need to reach something above you, use a strong step stool that has a grab bar.  Keep electrical cords out of the way.  Do not use floor polish or wax that makes floors slippery. If you  must use wax, use non-skid floor wax.  Do not have throw rugs and other things on the floor that can make you trip. What can I do with my stairs?  Do not leave any items on the stairs.  Make sure that there are handrails on both sides of the stairs and use them. Fix handrails that are broken or loose. Make sure that handrails are as long as the stairways.  Check any carpeting to make sure that it is firmly attached to the stairs. Fix any carpet that is loose or worn.  Avoid having throw rugs at the top or bottom of the stairs. If you do have throw rugs, attach them to the floor with carpet tape.  Make sure that you have a light switch at the top of the stairs and the bottom of the stairs. If you do not have them, ask someone to add them for you. What else can I do to help prevent falls?  Wear shoes that:  Do not have high heels.  Have rubber bottoms.  Are comfortable and fit you well.  Are closed at the toe. Do not wear sandals.  If you use a stepladder:  Make  sure that it is fully opened. Do not climb a closed stepladder.  Make sure that both sides of the stepladder are locked into place.  Ask someone to hold it for you, if possible.  Clearly mark and make sure that you can see:  Any grab bars or handrails.  First and last steps.  Where the edge of each step is.  Use tools that help you move around (mobility aids) if they are needed. These include:  Canes.  Walkers.  Scooters.  Crutches.  Turn on the lights when you go into a dark area. Replace any light bulbs as soon as they burn out.  Set up your furniture so you have a clear path. Avoid moving your furniture around.  If any of your floors are uneven, fix them.  If there are any pets around you, be aware of where they are.  Review your medicines with your doctor. Some medicines can make you feel dizzy. This can increase your chance of falling. Ask your doctor what other things that you can do to help prevent falls. This information is not intended to replace advice given to you by your health care provider. Make sure you discuss any questions you have with your health care provider. Document Released: 11/01/2008 Document Revised: 06/13/2015 Document Reviewed: 02/09/2014 Elsevier Interactive Patient Education  2017 [place].    Non fasting chem 7 and EGFR, HBA1C, CBC last week in April

## 2016-04-16 ENCOUNTER — Telehealth: Payer: Self-pay | Admitting: Family Medicine

## 2016-04-16 NOTE — Addendum Note (Signed)
Addended by: Kerri PerchesSIMPSON, MARGARET E on: 04/16/2016 04:56 AM   Modules accepted: Orders

## 2016-04-16 NOTE — Telephone Encounter (Signed)
pls order, mail  and notify pt that he needs these labs first week in May, his appt is the following week, CBC, chem 7 and EGFR and HBA1C

## 2016-04-20 ENCOUNTER — Telehealth: Payer: Self-pay

## 2016-04-20 DIAGNOSIS — E118 Type 2 diabetes mellitus with unspecified complications: Secondary | ICD-10-CM

## 2016-04-20 DIAGNOSIS — I10 Essential (primary) hypertension: Secondary | ICD-10-CM

## 2016-04-20 DIAGNOSIS — E785 Hyperlipidemia, unspecified: Secondary | ICD-10-CM

## 2016-04-20 NOTE — Telephone Encounter (Signed)
Labs mailed

## 2016-04-29 ENCOUNTER — Other Ambulatory Visit: Payer: Self-pay

## 2016-04-29 DIAGNOSIS — R69 Illness, unspecified: Secondary | ICD-10-CM | POA: Diagnosis not present

## 2016-04-29 MED ORDER — GLUCOSE BLOOD VI STRP: ORAL_STRIP | 5 refills | Status: DC

## 2016-04-29 MED ORDER — BAYER MICROLET LANCETS MISC: 5 refills | Status: DC

## 2016-05-05 ENCOUNTER — Other Ambulatory Visit: Payer: Self-pay

## 2016-05-05 DIAGNOSIS — R69 Illness, unspecified: Secondary | ICD-10-CM | POA: Diagnosis not present

## 2016-05-05 MED ORDER — UNABLE TO FIND: 5 refills | Status: AC

## 2016-05-06 DIAGNOSIS — M961 Postlaminectomy syndrome, not elsewhere classified: Secondary | ICD-10-CM | POA: Diagnosis not present

## 2016-05-06 DIAGNOSIS — Z5181 Encounter for therapeutic drug level monitoring: Secondary | ICD-10-CM | POA: Diagnosis not present

## 2016-05-06 DIAGNOSIS — Z79899 Other long term (current) drug therapy: Secondary | ICD-10-CM | POA: Diagnosis not present

## 2016-05-06 DIAGNOSIS — M545 Low back pain: Secondary | ICD-10-CM | POA: Diagnosis not present

## 2016-05-06 DIAGNOSIS — G894 Chronic pain syndrome: Secondary | ICD-10-CM | POA: Diagnosis not present

## 2016-05-06 DIAGNOSIS — M5416 Radiculopathy, lumbar region: Secondary | ICD-10-CM | POA: Diagnosis not present

## 2016-06-01 ENCOUNTER — Ambulatory Visit: Payer: Medicare HMO | Admitting: Family Medicine

## 2016-06-01 ENCOUNTER — Encounter: Payer: Self-pay | Admitting: Family Medicine

## 2016-06-11 ENCOUNTER — Ambulatory Visit (INDEPENDENT_AMBULATORY_CARE_PROVIDER_SITE_OTHER): Payer: Medicare HMO | Admitting: Family Medicine

## 2016-06-11 ENCOUNTER — Encounter: Payer: Self-pay | Admitting: Family Medicine

## 2016-06-11 VITALS — BP 138/80 | HR 74 | Resp 16 | Ht 71.0 in | Wt 256.0 lb

## 2016-06-11 DIAGNOSIS — N528 Other male erectile dysfunction: Secondary | ICD-10-CM

## 2016-06-11 DIAGNOSIS — M5441 Lumbago with sciatica, right side: Secondary | ICD-10-CM

## 2016-06-11 DIAGNOSIS — I10 Essential (primary) hypertension: Secondary | ICD-10-CM | POA: Diagnosis not present

## 2016-06-11 DIAGNOSIS — E118 Type 2 diabetes mellitus with unspecified complications: Secondary | ICD-10-CM | POA: Diagnosis not present

## 2016-06-11 DIAGNOSIS — G8929 Other chronic pain: Secondary | ICD-10-CM | POA: Diagnosis not present

## 2016-06-11 DIAGNOSIS — M5442 Lumbago with sciatica, left side: Secondary | ICD-10-CM | POA: Diagnosis not present

## 2016-06-11 LAB — BASIC METABOLIC PANEL WITH GFR
BUN: 12 mg/dL (ref 7–25)
CO2: 27 mmol/L (ref 20–31)
Calcium: 9.2 mg/dL (ref 8.6–10.3)
Chloride: 104 mmol/L (ref 98–110)
Creat: 0.82 mg/dL (ref 0.70–1.33)
GFR, Est African American: >89 mL/min (ref >=60)
GFR, Est Non African American: >89 mL/min (ref >=60)
Glucose, Bld: 163 mg/dL — ABNORMAL HIGH (ref 65–99)
Potassium: 4 mmol/L (ref 3.5–5.3)
Sodium: 140 mmol/L (ref 135–146)

## 2016-06-11 LAB — CBC
HCT: 43.6 % (ref 38.5–50.0)
Hemoglobin: 14.9 g/dL (ref 13.2–17.1)
MCH: 30.5 pg (ref 27.0–33.0)
MCHC: 34.2 g/dL (ref 32.0–36.0)
MCV: 89.2 fL (ref 80.0–100.0)
MPV: 10.1 fL (ref 7.5–12.5)
Platelets: 219 10*3/uL (ref 140–400)
RBC: 4.89 MIL/uL (ref 4.20–5.80)
RDW: 13.8 % (ref 11.0–15.0)
WBC: 9.6 10*3/uL (ref 3.8–10.8)

## 2016-06-11 NOTE — Patient Instructions (Addendum)
F/U in Dec 6 or shortly, call if you need me before  Labs today, TSH, HBA1C, chem 7 and EGFR, CBC   It is important that you exercise regularly at least 30 minutes 5 times a week. If you develop chest pain, have severe difficulty breathing, or feel very tired, stop exercising immediately and seek medical attention    10 pound weight loss please  All the best with colonoscopy and eye exam  Thank you  for choosing [facility]. We consider it a privelige to serve you.  Delivering excellent health care in a caring and  compassionate way is our goal.  Partnering with you,  so that together we can achieve this goal is our strategy.

## 2016-06-12 ENCOUNTER — Other Ambulatory Visit: Payer: Self-pay

## 2016-06-12 ENCOUNTER — Telehealth: Payer: Self-pay

## 2016-06-12 DIAGNOSIS — E118 Type 2 diabetes mellitus with unspecified complications: Secondary | ICD-10-CM

## 2016-06-12 LAB — HEMOGLOBIN A1C
Hgb A1c MFr Bld: 8.3 % — ABNORMAL HIGH (ref <5.7)
Mean Plasma Glucose: 192 mg/dL

## 2016-06-12 LAB — TSH: TSH: 1.17 mIU/L (ref 0.40–4.50)

## 2016-06-12 MED ORDER — GLIPIZIDE ER 5 MG PO TB24: 5.0000 mg | ORAL_TABLET | Freq: Every day | ORAL | 5 refills | Status: DC

## 2016-06-12 NOTE — Telephone Encounter (Signed)
-----  Message from Fayrene Helper, MD sent at 06/12/2016  8:34 AM EDT ----- pls he needs rept HBA1C , chem 7 and EGFR in 3 months , non fast even tho his appt is after that his blood sugar is now uncontrolled a, pls encourage him to follow though on this

## 2016-06-12 NOTE — Assessment & Plan Note (Signed)
Uncontrolled , increase dose of glipizide AND LIFESTYLE MODIFICATION NEEDS REPT hba1c IN  3 MONTHS Mr. Bryan Wright is reminded of the importance of commitment to daily physical activity for 30 minutes or more, as able and the need to limit carbohydrate intake to 30 to 60 grams per meal to help with blood sugar control.   The need to take medication as prescribed, test blood sugar as directed, and to call between visits if there is a concern that blood sugar is uncontrolled is also discussed.   Mr. Bryan Wright is reminded of the importance of daily foot exam, annual eye examination, and good blood sugar, blood pressure and cholesterol control.  Diabetic Labs Latest Ref Rng & Units 06/11/2016 12/24/2015 03/20/2015 08/21/2014 02/28/2014  HbA1c <5.7 % 8.3(H) 7.8(H) 7.9(H) 7.4(H) 7.1(H)  Microalbumin 0.00 - 1.89 mg/dL - - - - -  Micro/Creat Ratio 0.0 - 30.0 mg/g - - - - -  Chol <200 mg/dL - 147149 - 829180 -  HDL >56>40 mg/dL - 21(H33(L) - 42 -  Calc LDL <100 mg/dL - 97 - 086118 -  Triglycerides <150 mg/dL - 94 - 99 -  Creatinine 0.70 - 1.33 mg/dL 5.780.82 4.690.87 6.290.87 5.280.78 4.130.78   BP/Weight 06/11/2016 04/15/2016 12/19/2015 03/20/2015 08/21/2014 02/20/2014 09/18/2013  Systolic BP 138 142 120 140 140 134 120  Diastolic BP 80 76 62 80 84 76 72  Wt. (Lbs) 256 260 260.04 268.4 266.04 263.12 263.08  BMI 35.7 36.26 36.27 37.45 37.62 37.21 37.2   Foot/eye exam completion dates Latest Ref Rng & Units 06/11/2016 03/20/2015  Eye Exam No Retinopathy - -  Foot Form Completion - Done Done

## 2016-06-14 ENCOUNTER — Encounter: Payer: Self-pay | Admitting: Family Medicine

## 2016-06-14 NOTE — Assessment & Plan Note (Signed)
Managed by pain specialist and controlled

## 2016-06-14 NOTE — Progress Notes (Signed)
Bryan Wright     MRN: [id number]      DOB: 1955/03/29   HPI Bryan Wright is here for follow up and re-evaluation of chronic medical conditions, medication management and review of any available recent lab and radiology data.  Preventive health is updated, specifically  Cancer screening and Immunization.  Has colonoscopy scheduled Questions or concerns regarding consultations or procedures which the PT has had in the interim are  Addressed.Followed by pain clinic for chronic pain management The PT denies any adverse reactions to current medications since the last visit.  Denies polyuria, polydipsia, blurred vision , or hypoglycemic episodes. Not testing blood sugars, no exercise commitment and not very diligent with diet ROS Denies recent fever or chills. Denies sinus pressure, nasal congestion, ear pain or sore throat. Denies chest congestion, productive cough or wheezing. Denies chest pains, palpitations and leg swelling Denies abdominal pain, nausea, vomiting,diarrhea or constipation.   Denies dysuria, frequency, hesitancy or incontinence. Denies uncontrolled joint pain, swelling and limitation in mobility. Denies headaches, seizures, numbness, or tingling. Denies depression, anxiety or insomnia. Denies skin break down or rash.   PE  BP 138/80   Pulse 74   Resp 16   Ht 5\' 11"  (1.803 m)   Wt 256 lb (116.1 kg)   SpO2 96%   BMI 35.70 kg/m   Patient alert and oriented and in no cardiopulmonary distress.  HEENT: No facial asymmetry, EOMI,   oropharynx pink and moist.  Neck supple no JVD, no mass.  Chest: Clear to auscultation bilaterally.  CVS: S1, S2 no murmurs, no S3.Regular rate.  ABD: Soft non tender.   Ext: No edema  MS: Adequate though reduced  ROM spine, shoulders, hips and knees.  Skin: breakdown and rash noted on feet and onychomycosis.  Psych: Good eye contact, normal affect. Memory intact not anxious or depressed appearing.  CNS: CN 2-12 intact,  power,  normal throughout.no focal deficits noted.   Assessment & Plan DM type 2 (diabetes mellitus, type 2) Uncontrolled , increase dose of glipizide AND LIFESTYLE MODIFICATION NEEDS REPT hba1c IN  3 MONTHS Bryan Wright is reminded of the importance of commitment to daily physical activity for 30 minutes or more, as able and the need to limit carbohydrate intake to 30 to 60 grams per meal to help with blood sugar control.   The need to take medication as prescribed, test blood sugar as directed, and to call between visits if there is a concern that blood sugar is uncontrolled is also discussed.   Bryan Wright is reminded of the importance of daily foot exam, annual eye examination, and good blood sugar, blood pressure and cholesterol control.  Diabetic Labs Latest Ref Rng & Units 06/11/2016 12/24/2015 03/20/2015 08/21/2014 02/28/2014  HbA1c <5.7 % 8.3(H) 7.8(H) 7.9(H) 7.4(H) 7.1(H)  Microalbumin 0.00 - 1.89 mg/dL - - - - -  Micro/Creat Ratio 0.0 - 30.0 mg/g - - - - -  Chol <200 mg/dL - 409 - 811 -  HDL >91 mg/dL - 47(W) - 42 -  Calc LDL <100 mg/dL - 97 - 295 -  Triglycerides <150 mg/dL - 94 - 99 -  Creatinine 0.70 - 1.33 mg/dL 6.21 3.08 6.57 8.46 9.62   BP/Weight 06/11/2016 04/15/2016 12/19/2015 03/20/2015 08/21/2014 02/20/2014 09/18/2013  Systolic BP 138 142 120 140 140 134 120  Diastolic BP 80 76 62 80 84 76 72  Wt. (Lbs) 256 260 260.04 268.4 266.04 263.12 263.08  BMI 35.7 36.26 36.27 37.45 37.62 37.21 37.2  Foot/eye exam completion dates Latest Ref Rng & Units 06/11/2016 03/20/2015  Eye Exam No Retinopathy - -  Foot Form Completion - Done Done        Essential hypertension Sub optimal control, no med change DASH diet and commitment to daily physical activity for a minimum of 30 minutes discussed and encouraged, as a part of hypertension management. The importance of attaining a healthy weight is also discussed.  BP/Weight 06/11/2016 04/15/2016 12/19/2015 03/20/2015 08/21/2014 02/20/2014  09/18/2013  Systolic BP 138 142 120 140 140 134 120  Diastolic BP 80 76 62 80 84 76 72  Wt. (Lbs) 256 260 260.04 268.4 266.04 263.12 263.08  BMI 35.7 36.26 36.27 37.45 37.62 37.21 37.2       Morbid obesity Improved Patient re-educated about  the importance of commitment to a  minimum of 150 minutes of exercise per week.  The importance of healthy food choices with portion control discussed. Encouraged to start a food diary, count calories and to consider  joining a support group. Sample diet sheets offered. Goals set by the patient for the next several months.   Weight /BMI 06/11/2016 04/15/2016 12/19/2015  WEIGHT 256 lb 260 lb 260 lb 0.6 oz  HEIGHT 5\' 11"  5\' 11"  5\' 11"   BMI 35.7 kg/m2 36.26 kg/m2 36.27 kg/m2      Erectile dysfunction Continue current treatment as he obtains some success  Backache Managed by pain specialist and controlled

## 2016-06-14 NOTE — Assessment & Plan Note (Signed)
Continue current treatment as he obtains some success

## 2016-06-14 NOTE — Assessment & Plan Note (Signed)
Improved Patient re-educated about  the importance of commitment to a  minimum of 150 minutes of exercise per week.  The importance of healthy food choices with portion control discussed. Encouraged to start a food diary, count calories and to consider  joining a support group. Sample diet sheets offered. Goals set by the patient for the next several months.   Weight /BMI 06/11/2016 04/15/2016 12/19/2015  WEIGHT 256 lb 260 lb 260 lb 0.6 oz  HEIGHT 5\' 11"  5\' 11"  5\' 11"   BMI 35.7 kg/m2 36.26 kg/m2 36.27 kg/m2

## 2016-06-14 NOTE — Assessment & Plan Note (Signed)
Sub optimal control, no med change DASH diet and commitment to daily physical activity for a minimum of 30 minutes discussed and encouraged, as a part of hypertension management. The importance of attaining a healthy weight is also discussed.  BP/Weight 06/11/2016 04/15/2016 12/19/2015 03/20/2015 08/21/2014 02/20/2014 09/18/2013  Systolic BP 138 142 120 140 140 134 120  Diastolic BP 80 76 62 80 84 76 72  Wt. (Lbs) 256 260 260.04 268.4 266.04 263.12 263.08  BMI 35.7 36.26 36.27 37.45 37.62 37.21 37.2

## 2016-07-29 DIAGNOSIS — M545 Low back pain: Secondary | ICD-10-CM | POA: Diagnosis not present

## 2016-07-29 DIAGNOSIS — M961 Postlaminectomy syndrome, not elsewhere classified: Secondary | ICD-10-CM | POA: Diagnosis not present

## 2016-07-29 DIAGNOSIS — G894 Chronic pain syndrome: Secondary | ICD-10-CM | POA: Diagnosis not present

## 2016-07-29 DIAGNOSIS — Z9689 Presence of other specified functional implants: Secondary | ICD-10-CM | POA: Diagnosis not present

## 2016-08-05 DIAGNOSIS — H52209 Unspecified astigmatism, unspecified eye: Secondary | ICD-10-CM | POA: Diagnosis not present

## 2016-08-05 DIAGNOSIS — H524 Presbyopia: Secondary | ICD-10-CM | POA: Diagnosis not present

## 2016-08-05 DIAGNOSIS — H5203 Hypermetropia, bilateral: Secondary | ICD-10-CM | POA: Diagnosis not present

## 2016-08-07 DIAGNOSIS — R69 Illness, unspecified: Secondary | ICD-10-CM | POA: Diagnosis not present

## 2016-10-15 ENCOUNTER — Other Ambulatory Visit: Payer: Self-pay | Admitting: Family Medicine

## 2016-11-09 DIAGNOSIS — R69 Illness, unspecified: Secondary | ICD-10-CM | POA: Diagnosis not present

## 2016-11-11 DIAGNOSIS — Z5181 Encounter for therapeutic drug level monitoring: Secondary | ICD-10-CM | POA: Diagnosis not present

## 2016-11-11 DIAGNOSIS — M5416 Radiculopathy, lumbar region: Secondary | ICD-10-CM | POA: Diagnosis not present

## 2016-11-11 DIAGNOSIS — M961 Postlaminectomy syndrome, not elsewhere classified: Secondary | ICD-10-CM | POA: Diagnosis not present

## 2016-11-11 DIAGNOSIS — Z79899 Other long term (current) drug therapy: Secondary | ICD-10-CM | POA: Diagnosis not present

## 2016-11-11 DIAGNOSIS — Z9689 Presence of other specified functional implants: Secondary | ICD-10-CM | POA: Diagnosis not present

## 2016-11-11 DIAGNOSIS — G894 Chronic pain syndrome: Secondary | ICD-10-CM | POA: Diagnosis not present

## 2016-11-25 DIAGNOSIS — Z23 Encounter for immunization: Secondary | ICD-10-CM | POA: Diagnosis not present

## 2016-11-25 DIAGNOSIS — I10 Essential (primary) hypertension: Secondary | ICD-10-CM | POA: Diagnosis not present

## 2016-11-25 DIAGNOSIS — E119 Type 2 diabetes mellitus without complications: Secondary | ICD-10-CM | POA: Diagnosis not present

## 2016-12-21 ENCOUNTER — Ambulatory Visit: Payer: Medicare HMO | Admitting: Family Medicine

## 2016-12-22 DIAGNOSIS — E118 Type 2 diabetes mellitus with unspecified complications: Secondary | ICD-10-CM | POA: Diagnosis not present

## 2017-02-03 DIAGNOSIS — Z5181 Encounter for therapeutic drug level monitoring: Secondary | ICD-10-CM | POA: Diagnosis not present

## 2017-02-03 DIAGNOSIS — M5416 Radiculopathy, lumbar region: Secondary | ICD-10-CM | POA: Diagnosis not present

## 2017-02-03 DIAGNOSIS — Z79899 Other long term (current) drug therapy: Secondary | ICD-10-CM | POA: Diagnosis not present

## 2017-02-03 DIAGNOSIS — I10 Essential (primary) hypertension: Secondary | ICD-10-CM | POA: Diagnosis not present

## 2017-02-03 DIAGNOSIS — G894 Chronic pain syndrome: Secondary | ICD-10-CM | POA: Diagnosis not present

## 2017-02-03 DIAGNOSIS — M961 Postlaminectomy syndrome, not elsewhere classified: Secondary | ICD-10-CM | POA: Diagnosis not present

## 2017-02-03 DIAGNOSIS — M5441 Lumbago with sciatica, right side: Secondary | ICD-10-CM | POA: Diagnosis not present

## 2017-02-17 DIAGNOSIS — R69 Illness, unspecified: Secondary | ICD-10-CM | POA: Diagnosis not present

## 2017-04-05 DIAGNOSIS — Z9689 Presence of other specified functional implants: Secondary | ICD-10-CM | POA: Diagnosis not present

## 2017-04-05 DIAGNOSIS — M5416 Radiculopathy, lumbar region: Secondary | ICD-10-CM | POA: Diagnosis not present

## 2017-04-05 DIAGNOSIS — G894 Chronic pain syndrome: Secondary | ICD-10-CM | POA: Diagnosis not present

## 2017-04-05 DIAGNOSIS — I10 Essential (primary) hypertension: Secondary | ICD-10-CM | POA: Diagnosis not present

## 2017-04-05 DIAGNOSIS — M961 Postlaminectomy syndrome, not elsewhere classified: Secondary | ICD-10-CM | POA: Diagnosis not present

## 2017-04-06 DIAGNOSIS — R69 Illness, unspecified: Secondary | ICD-10-CM | POA: Diagnosis not present

## 2017-05-31 DIAGNOSIS — M5416 Radiculopathy, lumbar region: Secondary | ICD-10-CM | POA: Diagnosis not present

## 2017-05-31 DIAGNOSIS — Z9689 Presence of other specified functional implants: Secondary | ICD-10-CM | POA: Diagnosis not present

## 2017-05-31 DIAGNOSIS — M961 Postlaminectomy syndrome, not elsewhere classified: Secondary | ICD-10-CM | POA: Diagnosis not present

## 2017-05-31 DIAGNOSIS — G894 Chronic pain syndrome: Secondary | ICD-10-CM | POA: Diagnosis not present

## 2017-07-12 DIAGNOSIS — R69 Illness, unspecified: Secondary | ICD-10-CM | POA: Diagnosis not present

## 2017-08-06 DIAGNOSIS — M5416 Radiculopathy, lumbar region: Secondary | ICD-10-CM | POA: Diagnosis not present

## 2017-08-06 DIAGNOSIS — G894 Chronic pain syndrome: Secondary | ICD-10-CM | POA: Diagnosis not present

## 2017-08-06 DIAGNOSIS — Z5181 Encounter for therapeutic drug level monitoring: Secondary | ICD-10-CM | POA: Diagnosis not present

## 2017-08-06 DIAGNOSIS — Z79899 Other long term (current) drug therapy: Secondary | ICD-10-CM | POA: Diagnosis not present

## 2017-08-06 DIAGNOSIS — M961 Postlaminectomy syndrome, not elsewhere classified: Secondary | ICD-10-CM | POA: Diagnosis not present

## 2017-08-06 DIAGNOSIS — Z9689 Presence of other specified functional implants: Secondary | ICD-10-CM | POA: Diagnosis not present

## 2017-10-01 DIAGNOSIS — M5416 Radiculopathy, lumbar region: Secondary | ICD-10-CM | POA: Diagnosis not present

## 2017-10-01 DIAGNOSIS — G894 Chronic pain syndrome: Secondary | ICD-10-CM | POA: Diagnosis not present

## 2017-10-01 DIAGNOSIS — M961 Postlaminectomy syndrome, not elsewhere classified: Secondary | ICD-10-CM | POA: Diagnosis not present

## 2017-10-01 DIAGNOSIS — Z9689 Presence of other specified functional implants: Secondary | ICD-10-CM | POA: Diagnosis not present

## 2017-10-09 DIAGNOSIS — R69 Illness, unspecified: Secondary | ICD-10-CM | POA: Diagnosis not present

## 2017-10-19 DIAGNOSIS — Z23 Encounter for immunization: Secondary | ICD-10-CM | POA: Diagnosis not present

## 2017-10-19 DIAGNOSIS — I10 Essential (primary) hypertension: Secondary | ICD-10-CM | POA: Diagnosis not present

## 2017-10-19 DIAGNOSIS — E119 Type 2 diabetes mellitus without complications: Secondary | ICD-10-CM | POA: Diagnosis not present

## 2017-10-20 DIAGNOSIS — E119 Type 2 diabetes mellitus without complications: Secondary | ICD-10-CM | POA: Diagnosis not present

## 2017-10-20 DIAGNOSIS — I10 Essential (primary) hypertension: Secondary | ICD-10-CM | POA: Diagnosis not present

## 2017-11-26 DIAGNOSIS — Z9689 Presence of other specified functional implants: Secondary | ICD-10-CM | POA: Diagnosis not present

## 2017-11-26 DIAGNOSIS — G894 Chronic pain syndrome: Secondary | ICD-10-CM | POA: Diagnosis not present

## 2017-11-26 DIAGNOSIS — M5416 Radiculopathy, lumbar region: Secondary | ICD-10-CM | POA: Diagnosis not present

## 2017-11-26 DIAGNOSIS — M961 Postlaminectomy syndrome, not elsewhere classified: Secondary | ICD-10-CM | POA: Diagnosis not present

## 2018-01-17 DIAGNOSIS — R69 Illness, unspecified: Secondary | ICD-10-CM | POA: Diagnosis not present

## 2018-01-26 DIAGNOSIS — Z79899 Other long term (current) drug therapy: Secondary | ICD-10-CM | POA: Diagnosis not present

## 2018-01-26 DIAGNOSIS — Z9689 Presence of other specified functional implants: Secondary | ICD-10-CM | POA: Diagnosis not present

## 2018-01-26 DIAGNOSIS — M961 Postlaminectomy syndrome, not elsewhere classified: Secondary | ICD-10-CM | POA: Diagnosis not present

## 2018-01-26 DIAGNOSIS — M5416 Radiculopathy, lumbar region: Secondary | ICD-10-CM | POA: Diagnosis not present

## 2018-01-26 DIAGNOSIS — G894 Chronic pain syndrome: Secondary | ICD-10-CM | POA: Diagnosis not present

## 2018-01-26 DIAGNOSIS — Z5181 Encounter for therapeutic drug level monitoring: Secondary | ICD-10-CM | POA: Diagnosis not present

## 2018-03-23 DIAGNOSIS — M5416 Radiculopathy, lumbar region: Secondary | ICD-10-CM | POA: Diagnosis not present

## 2018-03-23 DIAGNOSIS — G894 Chronic pain syndrome: Secondary | ICD-10-CM | POA: Diagnosis not present

## 2018-03-23 DIAGNOSIS — M5441 Lumbago with sciatica, right side: Secondary | ICD-10-CM | POA: Diagnosis not present

## 2018-03-23 DIAGNOSIS — M961 Postlaminectomy syndrome, not elsewhere classified: Secondary | ICD-10-CM | POA: Diagnosis not present

## 2018-05-18 DIAGNOSIS — M5416 Radiculopathy, lumbar region: Secondary | ICD-10-CM | POA: Diagnosis not present

## 2018-05-18 DIAGNOSIS — M961 Postlaminectomy syndrome, not elsewhere classified: Secondary | ICD-10-CM | POA: Diagnosis not present

## 2018-05-18 DIAGNOSIS — G894 Chronic pain syndrome: Secondary | ICD-10-CM | POA: Diagnosis not present

## 2018-05-18 DIAGNOSIS — M5441 Lumbago with sciatica, right side: Secondary | ICD-10-CM | POA: Diagnosis not present

## 2018-07-04 DIAGNOSIS — G894 Chronic pain syndrome: Secondary | ICD-10-CM | POA: Diagnosis not present

## 2018-07-04 DIAGNOSIS — M5441 Lumbago with sciatica, right side: Secondary | ICD-10-CM | POA: Diagnosis not present

## 2018-07-04 DIAGNOSIS — M5416 Radiculopathy, lumbar region: Secondary | ICD-10-CM | POA: Diagnosis not present

## 2018-07-04 DIAGNOSIS — M961 Postlaminectomy syndrome, not elsewhere classified: Secondary | ICD-10-CM | POA: Diagnosis not present

## 2018-08-01 DIAGNOSIS — G894 Chronic pain syndrome: Secondary | ICD-10-CM | POA: Diagnosis not present

## 2018-08-01 DIAGNOSIS — Z79899 Other long term (current) drug therapy: Secondary | ICD-10-CM | POA: Diagnosis not present

## 2018-08-01 DIAGNOSIS — M5416 Radiculopathy, lumbar region: Secondary | ICD-10-CM | POA: Diagnosis not present

## 2018-08-01 DIAGNOSIS — M961 Postlaminectomy syndrome, not elsewhere classified: Secondary | ICD-10-CM | POA: Diagnosis not present

## 2018-08-01 DIAGNOSIS — Z5181 Encounter for therapeutic drug level monitoring: Secondary | ICD-10-CM | POA: Diagnosis not present

## 2018-08-01 DIAGNOSIS — M5441 Lumbago with sciatica, right side: Secondary | ICD-10-CM | POA: Diagnosis not present

## 2018-09-27 DIAGNOSIS — M961 Postlaminectomy syndrome, not elsewhere classified: Secondary | ICD-10-CM | POA: Diagnosis not present

## 2018-09-27 DIAGNOSIS — M5416 Radiculopathy, lumbar region: Secondary | ICD-10-CM | POA: Diagnosis not present

## 2018-09-27 DIAGNOSIS — M5441 Lumbago with sciatica, right side: Secondary | ICD-10-CM | POA: Diagnosis not present

## 2018-09-27 DIAGNOSIS — G894 Chronic pain syndrome: Secondary | ICD-10-CM | POA: Diagnosis not present

## 2018-10-13 ENCOUNTER — Encounter: Payer: Self-pay | Admitting: Family Medicine

## 2018-10-13 ENCOUNTER — Other Ambulatory Visit: Payer: Self-pay

## 2018-10-13 ENCOUNTER — Ambulatory Visit (INDEPENDENT_AMBULATORY_CARE_PROVIDER_SITE_OTHER): Payer: Medicare HMO | Admitting: Family Medicine

## 2018-10-13 ENCOUNTER — Encounter (INDEPENDENT_AMBULATORY_CARE_PROVIDER_SITE_OTHER): Payer: Self-pay

## 2018-10-13 DIAGNOSIS — Z23 Encounter for immunization: Secondary | ICD-10-CM | POA: Diagnosis not present

## 2018-10-13 DIAGNOSIS — I10 Essential (primary) hypertension: Secondary | ICD-10-CM | POA: Diagnosis not present

## 2018-10-13 DIAGNOSIS — Z125 Encounter for screening for malignant neoplasm of prostate: Secondary | ICD-10-CM | POA: Diagnosis not present

## 2018-10-13 DIAGNOSIS — E785 Hyperlipidemia, unspecified: Secondary | ICD-10-CM

## 2018-10-13 DIAGNOSIS — E119 Type 2 diabetes mellitus without complications: Secondary | ICD-10-CM

## 2018-10-13 DIAGNOSIS — E1159 Type 2 diabetes mellitus with other circulatory complications: Secondary | ICD-10-CM

## 2018-10-13 DIAGNOSIS — I152 Hypertension secondary to endocrine disorders: Secondary | ICD-10-CM

## 2018-10-13 DIAGNOSIS — M5417 Radiculopathy, lumbosacral region: Secondary | ICD-10-CM

## 2018-10-13 MED ORDER — BENAZEPRIL HCL 10 MG PO TABS: ORAL_TABLET | ORAL | 1 refills | Status: DC

## 2018-10-13 MED ORDER — GLIPIZIDE ER 10 MG PO TB24: 10.0000 mg | ORAL_TABLET | Freq: Every day | ORAL | 1 refills | Status: DC

## 2018-10-13 MED ORDER — METFORMIN HCL 1000 MG PO TABS: ORAL_TABLET | ORAL | 1 refills | Status: DC

## 2018-10-13 NOTE — Patient Instructions (Signed)
    I appreciate the opportunity to provide you with the care for your health and wellness. Today we discussed: overall health  Follow up:  1 month in office BP check  Labs placed today-fasting   Reordered your medication. Take as directed.  Continued prayers for your wife's recovery.  Please continue to practice social distancing to keep you, your family, and our community safe.  If you must go out, please wear a mask and practice good handwashing.  [place] YOUR HANDS WELL AND FREQUENTLY. AVOID TOUCHING YOUR FACE, UNLESS YOUR HANDS ARE FRESHLY WASHED.  GET FRESH AIR DAILY. STAY HYDRATED WITH WATER.   It was a pleasure to see you and I look forward to continuing to work together on your health and well-being. Please do not hesitate to call the office if you need care or have questions about your care.  Have a wonderful day and week. With Gratitude, Cherly [place], DNP, AGNP-BC
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
## 2018-11-10 ENCOUNTER — Ambulatory Visit: Payer: Medicare HMO | Admitting: Family Medicine

## 2018-11-24 DIAGNOSIS — M5416 Radiculopathy, lumbar region: Secondary | ICD-10-CM | POA: Diagnosis not present

## 2018-11-24 DIAGNOSIS — M5441 Lumbago with sciatica, right side: Secondary | ICD-10-CM | POA: Diagnosis not present

## 2018-11-24 DIAGNOSIS — M961 Postlaminectomy syndrome, not elsewhere classified: Secondary | ICD-10-CM | POA: Diagnosis not present

## 2018-11-24 DIAGNOSIS — G894 Chronic pain syndrome: Secondary | ICD-10-CM | POA: Diagnosis not present

## 2019-01-05 ENCOUNTER — Telehealth: Payer: Self-pay

## 2019-01-05 NOTE — Telephone Encounter (Signed)
Tried calling patient to discuss making an appt for medication management. Received a paper from his insurance regarding him not being on a statin. Patient has no working numbers on file. Will route to provider as an [place].

## 2019-01-17 DIAGNOSIS — H52209 Unspecified astigmatism, unspecified eye: Secondary | ICD-10-CM | POA: Diagnosis not present

## 2019-01-17 DIAGNOSIS — H5203 Hypermetropia, bilateral: Secondary | ICD-10-CM | POA: Diagnosis not present

## 2019-01-17 DIAGNOSIS — H524 Presbyopia: Secondary | ICD-10-CM | POA: Diagnosis not present

## 2019-02-13 DIAGNOSIS — Z5181 Encounter for therapeutic drug level monitoring: Secondary | ICD-10-CM | POA: Diagnosis not present

## 2019-02-13 DIAGNOSIS — M961 Postlaminectomy syndrome, not elsewhere classified: Secondary | ICD-10-CM | POA: Diagnosis not present

## 2019-02-13 DIAGNOSIS — M5416 Radiculopathy, lumbar region: Secondary | ICD-10-CM | POA: Diagnosis not present

## 2019-02-13 DIAGNOSIS — Z79899 Other long term (current) drug therapy: Secondary | ICD-10-CM | POA: Diagnosis not present

## 2019-02-13 DIAGNOSIS — M5441 Lumbago with sciatica, right side: Secondary | ICD-10-CM | POA: Diagnosis not present

## 2019-02-13 DIAGNOSIS — G894 Chronic pain syndrome: Secondary | ICD-10-CM | POA: Diagnosis not present

## 2019-03-16 ENCOUNTER — Ambulatory Visit: Payer: Medicare HMO | Admitting: Family Medicine

## 2019-03-23 ENCOUNTER — Encounter: Payer: Self-pay | Admitting: Family Medicine

## 2019-03-23 ENCOUNTER — Encounter (INDEPENDENT_AMBULATORY_CARE_PROVIDER_SITE_OTHER): Payer: Self-pay | Admitting: *Deleted

## 2019-03-23 ENCOUNTER — Ambulatory Visit (INDEPENDENT_AMBULATORY_CARE_PROVIDER_SITE_OTHER): Payer: Medicare HMO | Admitting: Family Medicine

## 2019-03-23 ENCOUNTER — Other Ambulatory Visit: Payer: Self-pay

## 2019-03-23 VITALS — BP 160/84 | HR 86 | Temp 97.4°F | Resp 15 | Ht 70.5 in | Wt 258.0 lb

## 2019-03-23 DIAGNOSIS — E119 Type 2 diabetes mellitus without complications: Secondary | ICD-10-CM

## 2019-03-23 DIAGNOSIS — I10 Essential (primary) hypertension: Secondary | ICD-10-CM

## 2019-03-23 DIAGNOSIS — Z1211 Encounter for screening for malignant neoplasm of colon: Secondary | ICD-10-CM | POA: Diagnosis not present

## 2019-03-23 DIAGNOSIS — E1159 Type 2 diabetes mellitus with other circulatory complications: Secondary | ICD-10-CM

## 2019-03-23 DIAGNOSIS — N528 Other male erectile dysfunction: Secondary | ICD-10-CM

## 2019-03-23 DIAGNOSIS — I152 Hypertension secondary to endocrine disorders: Secondary | ICD-10-CM

## 2019-03-23 MED ORDER — BENAZEPRIL HCL 20 MG PO TABS: ORAL_TABLET | ORAL | 1 refills | Status: DC

## 2019-03-23 MED ORDER — GLIPIZIDE ER 10 MG PO TB24: 10.0000 mg | ORAL_TABLET | Freq: Every day | ORAL | 1 refills | Status: DC

## 2019-03-23 MED ORDER — METFORMIN HCL 1000 MG PO TABS: ORAL_TABLET | ORAL | 1 refills | Status: DC

## 2019-03-23 MED ORDER — SILDENAFIL CITRATE 20 MG PO TABS: ORAL_TABLET | ORAL | 0 refills | Status: DC

## 2019-03-23 NOTE — Progress Notes (Signed)
Subjective:  Patient ID: Bryan Wright, male    DOB: [birth year]  Age: 64 y.o. MRN: [id number]  CC:  Chief Complaint  Patient presents with  . Hypertension    follow up  . Diabetes      HPI  HPI  Bryan Wright is a 64 year old male patient of mine.  Who presents today for follow-up regarding diabetes and hypertension.    Diabetes: Diabetes follow-up:  Blood sugars at home are running 135.  Denies hypoglycemia.  Denies polydipsia and polyuria.  Last A1c 8.3% 05/2016, needs updated labs. Denies non-healing wounds or rashes. Denies signs of UTI or other infections. Patient follows a low sugar diet and checks feet regularly without concerns. Reports staying hydrated by drinking water.      Hypertension: Here for follow-up of hypertension.  He is not really exercising much but does try to walk.  Tries to report and stick to a low-salt diet.  Does not check blood at home.  Cardiac symptoms: none. Patient denies: chest pain, chest pressure/discomfort, claudication, dyspnea, exertional chest pressure/discomfort, fatigue, irregular heart beat, lower extremity edema, near-syncope, orthopnea, palpitations, paroxysmal nocturnal dyspnea, syncope and tachypnea. Cardiovascular risk factors: advanced age (older than 74 for men, 69 for women), diabetes mellitus, hypertension, male gender, obesity (BMI >= 30 kg/m2) and sedentary lifestyle.  Reports taking all medications as directed and denies side effects.  Today patient denies signs and symptoms of COVID 19 infection including fever, chills, cough, shortness of breath, and headache. Past Medical, Surgical, Social History, Allergies, and Medications have been Reviewed.   Past Medical History:  Diagnosis Date  . Diabetes mellitus dx in 2008  . Hypertension 2008    Current Meds  Medication Sig  . benazepril (LOTENSIN) 20 MG tablet TAKE 1 TABLET(20 MG) BY MOUTH DAILY  . fentaNYL (DURAGESIC - DOSED MCG/HR) 50 MCG/HR Place 50 mcg onto the skin  every 3 (three) days.  Marland Kitchen glipiZIDE (GLIPIZIDE XL) 10 MG 24 hr tablet Take 1 tablet (10 mg total) by mouth daily with breakfast.  . Hydrocodone-Acetaminophen (VICODIN HP) 10-300 MG TABS to 2 tablets every 6 hours as needed Gets 365b tabs per 3 months, from pain clinic in [place] Nerve stimulator implanted in 2012 for chronic back pain with failed back surgery x 2  . metFORMIN (GLUCOPHAGE) 1000 MG tablet TAKE 1 TABLET BY MOUTH TWICE DAILY WITH A MEAL  . Multiple Vitamin (THERA) TABS Take 2 tablets by mouth daily.  . sildenafil (REVATIO) 20 MG tablet DISSOLVE ONE TROCHE UNDERNEATH THE TONGUE 60 MINUTES PRIOR TO SEXUAL ENCOUNTER.  Marland Kitchen UNABLE TO FIND One touch meter and lancets Once daily testing dx e11.9  . [DISCONTINUED] benazepril (LOTENSIN) 10 MG tablet TAKE 1 TABLET(10 MG) BY MOUTH DAILY  . [DISCONTINUED] benazepril (LOTENSIN) 20 MG tablet TAKE 1 TABLET(10 MG) BY MOUTH DAILY  . [DISCONTINUED] glipiZIDE (GLIPIZIDE XL) 10 MG 24 hr tablet Take 1 tablet (10 mg total) by mouth daily with breakfast.  . [DISCONTINUED] metFORMIN (GLUCOPHAGE) 1000 MG tablet TAKE 1 TABLET BY MOUTH TWICE DAILY WITH A MEAL  . [DISCONTINUED] sildenafil (REVATIO) 20 MG tablet DISSOLVE ONE TROCHE UNDERNEATH THE TONGUE 60 MINUTES PRIOR TO SEXUAL ENCOUNTER.    ROS:  Review of Systems  Constitutional: Negative.   HENT: Negative.   Eyes: Negative.   Respiratory: Negative.   Cardiovascular: Negative.   Gastrointestinal: Negative.   Genitourinary: Negative.   Musculoskeletal: Negative.   Skin: Negative.   Neurological: Negative.   Endo/Heme/Allergies: Negative.   Psychiatric/Behavioral:  Negative.   All other systems reviewed and are negative.    Objective:   Today's Vitals: BP (!) 160/84   Pulse 86   Temp (!) 97.4 F (36.3 C) (Temporal)   Resp 15   Ht 5' 10.5" (1.791 m)   Wt 258 lb (117 kg)   SpO2 94%   BMI 36.50 kg/m  Vitals with BMI 03/23/2019 10/13/2018 06/11/2016  Height 5' 10.5" 5' 10.5" 5\' 11"   Weight 258  lbs 240 lbs 1 oz 256 lbs  BMI 36.48 33.94 35.8  Systolic 160 148  Diastolic 84 74 80  Pulse 86 85 74     Physical Exam Vitals and nursing note reviewed.  Constitutional:      Appearance: Normal appearance. He is well-developed and well-groomed. He is obese.  HENT:     Head: Normocephalic and atraumatic.     Right Ear: External ear normal.     Left Ear: External ear normal.     Mouth/Throat:     Comments: Mask in place Eyes:     General:        Right eye: No discharge.        Left eye: No discharge.     Conjunctiva/sclera: Conjunctivae normal.  Cardiovascular:     Rate and Rhythm: Normal rate and regular rhythm.     Pulses: Normal pulses.     Heart sounds: Normal heart sounds.  Pulmonary:     Effort: Pulmonary effort is normal.     Breath sounds: Normal breath sounds.  Musculoskeletal:        General: Normal range of motion.     Cervical back: Normal range of motion and neck supple.  Skin:    General: Skin is warm.  Neurological:     General: No focal deficit present.     Mental Status: He is alert and oriented to person, place, and time.  Psychiatric:        Attention and Perception: Attention normal.        Mood and Affect: Mood normal.        Speech: Speech normal.        Behavior: Behavior normal. Behavior is cooperative.        Thought Content: Thought content normal.        Cognition and Memory: Cognition normal.        Judgment: Judgment normal.     Assessment   1. Hypertension associated with diabetes (HCC)   2. Type 2 diabetes mellitus without complication, without long-term current use of insulin (HCC)   3. Other male erectile dysfunction   4. Encounter for screening for malignant neoplasm of colon     Tests ordered Orders Placed This Encounter  Procedures  . Ambulatory referral to Gastroenterology     Plan: Please see assessment and plan per problem list above.   Meds ordered this encounter  Medications  . DISCONTD: benazepril  (LOTENSIN) 20 MG tablet    Sig: TAKE 1 TABLET(10 MG) BY MOUTH DAILY    Dispense:  90 tablet    Refill:  1    Order Specific Question:   Supervising Provider    Answer:   SIMPSON, MARGARET E [[id number]]  . benazepril (LOTENSIN) 20 MG tablet    Sig: TAKE 1 TABLET(20 MG) BY MOUTH DAILY    Dispense:  90 tablet    Refill:  1    Dose change    Order Specific Question:   Supervising Provider    Answer:  [place], [place] [[id number]]  . metFORMIN (GLUCOPHAGE) 1000 MG tablet    Sig: TAKE 1 TABLET BY MOUTH TWICE DAILY WITH A MEAL    Dispense:  180 tablet    Refill:  1    **Patient requests 90 days supply**    Order Specific Question:   Supervising Provider    Answer:   Tula Nakayama E [[id number]]  . glipiZIDE (GLIPIZIDE XL) 10 MG 24 hr tablet    Sig: Take 1 tablet (10 mg total) by mouth daily with breakfast.    Dispense:  90 tablet    Refill:  1    Order Specific Question:   Supervising Provider    Answer:   SIMPSON, MARGARET E [[id number]]  . sildenafil (REVATIO) 20 MG tablet    Sig: DISSOLVE ONE TROCHE UNDERNEATH THE TONGUE 60 MINUTES PRIOR TO SEXUAL ENCOUNTER.    Dispense:  10 tablet    Refill:  0    Order Specific Question:   Supervising Provider    Answer:   Fayrene Helper [[id number]]    Patient to follow-up in 06/27/2019.  Perlie Mayo, NP

## 2019-03-23 NOTE — Patient Instructions (Addendum)
I appreciate the opportunity to provide you with care for your health and wellness. Today we discussed: BP and Diabetes   Follow up: 3-4 months   Fasting labs tomorrow   Increase benazepril to 20 mg daily  Please continue to practice social distancing to keep you, your family, and our community safe.  If you must go out, please wear a mask and practice good handwashing.  It was a pleasure to see you and I look forward to continuing to work together on your health and well-being. Please do not hesitate to call the office if you need care or have questions about your care.  Have a wonderful day and week. With Gratitude, [name], DNP, AGNP-BC

## 2019-03-24 NOTE — Assessment & Plan Note (Signed)
Refill provided

## 2019-03-24 NOTE — Assessment & Plan Note (Signed)
Bryan Wright is encouraged to check blood sugar daily as directed. Continue current medications. Needs to be on statin therapy. Educated on importance of maintain a well balanced diabetic friendly diet.  He is reminded the importance of maintaining  good blood sugars,  taking medications as directed, daily foot care, annual eye exams. Additionally educated about keeping good control over blood pressure and cholesterol as well.

## 2019-03-24 NOTE — Assessment & Plan Note (Signed)
Advised to get his updated labs.  Refills provided.  Continue DASH diet and get 30 minutes of exercise at least 5 days a week.

## 2019-03-27 ENCOUNTER — Telehealth: Payer: Self-pay

## 2019-03-27 ENCOUNTER — Other Ambulatory Visit: Payer: Self-pay

## 2019-03-27 DIAGNOSIS — N528 Other male erectile dysfunction: Secondary | ICD-10-CM

## 2019-03-27 MED ORDER — SILDENAFIL CITRATE 20 MG PO TABS: ORAL_TABLET | ORAL | 0 refills | Status: DC

## 2019-03-27 NOTE — Telephone Encounter (Signed)
Med refilled.

## 2019-03-27 NOTE — Telephone Encounter (Signed)
Please call in RX sildenafil (REVATIO) 20 MG tablet to [facility]  Should not be [employer]

## 2019-04-10 DIAGNOSIS — M961 Postlaminectomy syndrome, not elsewhere classified: Secondary | ICD-10-CM | POA: Diagnosis not present

## 2019-04-10 DIAGNOSIS — G894 Chronic pain syndrome: Secondary | ICD-10-CM | POA: Diagnosis not present

## 2019-04-10 DIAGNOSIS — M5416 Radiculopathy, lumbar region: Secondary | ICD-10-CM | POA: Diagnosis not present

## 2019-04-10 DIAGNOSIS — M5441 Lumbago with sciatica, right side: Secondary | ICD-10-CM | POA: Diagnosis not present

## 2019-06-14 DIAGNOSIS — M5416 Radiculopathy, lumbar region: Secondary | ICD-10-CM | POA: Diagnosis not present

## 2019-06-14 DIAGNOSIS — M5441 Lumbago with sciatica, right side: Secondary | ICD-10-CM | POA: Diagnosis not present

## 2019-06-14 DIAGNOSIS — G894 Chronic pain syndrome: Secondary | ICD-10-CM | POA: Diagnosis not present

## 2019-06-14 DIAGNOSIS — M961 Postlaminectomy syndrome, not elsewhere classified: Secondary | ICD-10-CM | POA: Diagnosis not present

## 2019-06-27 ENCOUNTER — Telehealth: Payer: Medicare HMO | Admitting: Family Medicine

## 2019-07-04 ENCOUNTER — Encounter: Payer: Self-pay | Admitting: Family Medicine

## 2019-07-04 ENCOUNTER — Telehealth (INDEPENDENT_AMBULATORY_CARE_PROVIDER_SITE_OTHER): Payer: Medicare HMO | Admitting: Family Medicine

## 2019-07-04 ENCOUNTER — Other Ambulatory Visit: Payer: Self-pay

## 2019-07-04 VITALS — BP 160/54 | Ht 70.5 in | Wt 258.0 lb

## 2019-07-04 DIAGNOSIS — I10 Essential (primary) hypertension: Secondary | ICD-10-CM

## 2019-07-04 DIAGNOSIS — E119 Type 2 diabetes mellitus without complications: Secondary | ICD-10-CM

## 2019-07-04 DIAGNOSIS — E1159 Type 2 diabetes mellitus with other circulatory complications: Secondary | ICD-10-CM

## 2019-07-04 DIAGNOSIS — I152 Hypertension secondary to endocrine disorders: Secondary | ICD-10-CM

## 2019-07-04 NOTE — Progress Notes (Signed)
Virtual Visit via Telephone Note   This visit type was conducted due to national recommendations for restrictions regarding the COVID-19 Pandemic (e.g. social distancing) in an effort to limit this patient's exposure and mitigate transmission in our community.  Due to his co-morbid illnesses, this patient is at least at moderate risk for complications without adequate follow up.  This format is felt to be most appropriate for this patient at this time.  The patient did not have access to video technology/had technical difficulties with video requiring transitioning to audio format only (telephone).  All issues noted in this document were discussed and addressed.  No physical exam could be performed with this format.   Evaluation Performed:  Follow-up visit  Date:  07/04/2019   ID:  Bryan Wright, DOB April 07, 1955, MRN [id number]  Patient Location: Home Provider Location: Office  Location of Patient: Home Location of Provider: Telehealth Consent was obtain for visit to be over via telehealth. I verified that I am speaking with the correct person using two identifiers.  PCP:  Perlie Mayo, NP   Chief Complaint:   Follow-up  History of Present Illness:    Bryan Wright is a 64 y.o. male with history of diabetes, hypertension, dyslipidemia, obesity among others.  Presents today for follow-up predominantly with high blood pressure.  He reports that he has had good numbers when he last went to the pain clinic but he is unsure of what the actual level was.  Unable to verify this today in the office secondary to being a phone visit.  He reports he take his medications as directed and without any issues.  He denies having any chest pain, palpitations, leg swelling, dizziness, headaches, vision changes, cough, fever, chills, shortness of breath or any other signs or symptoms of elevated blood pressure and or infection.  He denies having any excessive low blood sugars hand or polydipsia,  polyphagia, polyuria.  The patient does not have symptoms concerning for COVID-19 infection (fever, chills, cough, or new shortness of breath).   Past Medical, Surgical, Social History, Allergies, and Medications have been Reviewed.  Past Medical History:  Diagnosis Date  . Diabetes mellitus dx in 2008  . Hypertension 2008   Past Surgical History:  Procedure Laterality Date  . APPENDECTOMY     early 20's  . CERVICAL DISC SURGERY    . LUNG SURGERY    . SPINE SURGERY  2002   cervical  2002  . SPINE SURGERY  2009, 2010   lumbar     Current Meds  Medication Sig  . benazepril (LOTENSIN) 20 MG tablet TAKE 1 TABLET(20 MG) BY MOUTH DAILY  . fentaNYL (DURAGESIC - DOSED MCG/HR) 50 MCG/HR Place 50 mcg onto the skin every 3 (three) days.  Marland Kitchen glipiZIDE (GLIPIZIDE XL) 10 MG 24 hr tablet Take 1 tablet (10 mg total) by mouth daily with breakfast.  . Hydrocodone-Acetaminophen (VICODIN HP) 10-300 MG TABS to 2 tablets every 6 hours as needed Gets 365b tabs per 3 months, from pain clinic in [place] Nerve stimulator implanted in 2012 for chronic back pain with failed back surgery x 2  . metFORMIN (GLUCOPHAGE) 1000 MG tablet TAKE 1 TABLET BY MOUTH TWICE DAILY WITH A MEAL  . Multiple Vitamin (THERA) TABS Take 2 tablets by mouth daily.  . sildenafil (REVATIO) 20 MG tablet DISSOLVE ONE TROCHE UNDERNEATH THE TONGUE 60 MINUTES PRIOR TO SEXUAL ENCOUNTER.  Marland Kitchen UNABLE TO FIND One touch meter and lancets Once daily testing  dx e11.9     Allergies:   Patient has no known allergies.   ROS:   Please see the history of present illness.    All other systems reviewed and are negative.   Labs/Other Tests and Data Reviewed:    Recent Labs: No results found for requested labs within last 8760 hours.   Recent Lipid Panel Lab Results  Component Value Date/Time   CHOL 149 12/24/2015 03:25 PM   TRIG 94 12/24/2015 03:25 PM   HDL 33 (L) 12/24/2015 03:25 PM   CHOLHDL 4.5 12/24/2015 03:25 PM   LDLCALC 97  12/24/2015 03:25 PM    Wt Readings from Last 3 Encounters:  07/04/19 258 lb (117 kg)  03/23/19 258 lb (117 kg)  10/13/18 240 lb 0.6 oz (108.9 kg)     Objective:    Vital Signs:  BP (!) 160/54   Ht 5' 10.5" (1.791 m)   Wt 258 lb (117 kg)   BMI 36.50 kg/m    VITAL SIGNS:  reviewed GEN:  Alert and oriented RESPIRATORY:  No shortness of breath noted in conversation PSYCH:  normal affect and mood   ASSESSMENT & PLAN:    1. Hypertension associated with diabetes (HCC)   2. Type 2 diabetes mellitus without complication, without long-term current use of insulin (HCC)   3. Severe obesity (BMI 35.0-39.9) with comorbidity (HCC)   Time:   Today, I have spent 10 minutes with the patient with telehealth technology discussing the above problems.     Medication Adjustments/Labs and Tests Ordered: Current medicines are reviewed at length with the patient today.  Concerns regarding medicines are outlined above.   Tests Ordered: No orders of the defined types were placed in this encounter.   Medication Changes: No orders of the defined types were placed in this encounter.   Disposition:  Follow up 5 months in office  Signed, Freddy Finner, NP  07/04/2019 11:43 AM     [facility]

## 2019-07-04 NOTE — Assessment & Plan Note (Signed)
Overall labs were good.  Continue current medication at this time.  He reports that his most recent blood pressure check was in range.  However he does not remember the number.  Next appointment will be in office for verification of blood pressure.  Encouraged DASH diet and 30 minutes of exercise release 5 days a week.Patient acknowledged agreement and understanding of the plan.

## 2019-07-04 NOTE — Patient Instructions (Signed)
I appreciate the opportunity to provide you with care for your health and wellness. Today we discussed: blood pressure, DM, and weight  Follow up: 5 months in office   No labs or referrals today  Have a good Summer !  Please continue to practice social distancing to keep you, your family, and our community safe.  If you must go out, please wear a mask and practice good handwashing.  It was a pleasure to see you and I look forward to continuing to work together on your health and well-being. Please do not hesitate to call the office if you need care or have questions about your care.  Have a wonderful day and week. With Gratitude, [name], DNP, AGNP-BC

## 2019-07-04 NOTE — Assessment & Plan Note (Signed)
Bryan Wright is encouraged to check blood sugar daily as directed. Continue current medications. Would benefit with statin therapy He is reminded the importance of maintaining  good blood sugars,  taking medications as directed, daily foot care, annual eye exams. Additionally educated about keeping good control over blood pressure and cholesterol as well.

## 2019-07-04 NOTE — Assessment & Plan Note (Signed)
Obesity is linked to hypertension, type 2 diabetes Bryan Wright is re-educated about the importance of exercise daily to help with weight management. A minumum of 30 minutes daily is recommended. Additionally, importance of healthy food choices  with portion control discussed.   Wt Readings from Last 3 Encounters:  07/04/19 258 lb (117 kg)  03/23/19 258 lb (117 kg)  10/13/18 240 lb 0.6 oz (108.9 kg)

## 2019-08-09 DIAGNOSIS — M961 Postlaminectomy syndrome, not elsewhere classified: Secondary | ICD-10-CM | POA: Diagnosis not present

## 2019-08-09 DIAGNOSIS — Z5181 Encounter for therapeutic drug level monitoring: Secondary | ICD-10-CM | POA: Diagnosis not present

## 2019-08-09 DIAGNOSIS — M5416 Radiculopathy, lumbar region: Secondary | ICD-10-CM | POA: Diagnosis not present

## 2019-08-09 DIAGNOSIS — G894 Chronic pain syndrome: Secondary | ICD-10-CM | POA: Diagnosis not present

## 2019-08-09 DIAGNOSIS — Z79899 Other long term (current) drug therapy: Secondary | ICD-10-CM | POA: Diagnosis not present

## 2019-08-09 DIAGNOSIS — M5441 Lumbago with sciatica, right side: Secondary | ICD-10-CM | POA: Diagnosis not present

## 2019-08-21 ENCOUNTER — Ambulatory Visit: Payer: Medicare HMO

## 2019-09-19 ENCOUNTER — Other Ambulatory Visit: Payer: Self-pay | Admitting: *Deleted

## 2019-09-19 DIAGNOSIS — E119 Type 2 diabetes mellitus without complications: Secondary | ICD-10-CM

## 2019-09-19 MED ORDER — GLIPIZIDE ER 10 MG PO TB24: 10.0000 mg | ORAL_TABLET | Freq: Every day | ORAL | 1 refills | Status: DC

## 2019-09-20 ENCOUNTER — Other Ambulatory Visit: Payer: Self-pay | Admitting: *Deleted

## 2019-09-20 DIAGNOSIS — I152 Hypertension secondary to endocrine disorders: Secondary | ICD-10-CM

## 2019-09-20 DIAGNOSIS — E1159 Type 2 diabetes mellitus with other circulatory complications: Secondary | ICD-10-CM

## 2019-09-20 DIAGNOSIS — E119 Type 2 diabetes mellitus without complications: Secondary | ICD-10-CM

## 2019-09-20 MED ORDER — METFORMIN HCL 1000 MG PO TABS: ORAL_TABLET | ORAL | 1 refills | Status: DC

## 2019-09-20 MED ORDER — BENAZEPRIL HCL 20 MG PO TABS: ORAL_TABLET | ORAL | 1 refills | Status: DC

## 2019-10-04 DIAGNOSIS — M5416 Radiculopathy, lumbar region: Secondary | ICD-10-CM | POA: Diagnosis not present

## 2019-10-04 DIAGNOSIS — G894 Chronic pain syndrome: Secondary | ICD-10-CM | POA: Diagnosis not present

## 2019-10-04 DIAGNOSIS — M5441 Lumbago with sciatica, right side: Secondary | ICD-10-CM | POA: Diagnosis not present

## 2019-10-04 DIAGNOSIS — M961 Postlaminectomy syndrome, not elsewhere classified: Secondary | ICD-10-CM | POA: Diagnosis not present

## 2019-11-08 DIAGNOSIS — R69 Illness, unspecified: Secondary | ICD-10-CM | POA: Diagnosis not present

## 2019-12-05 ENCOUNTER — Other Ambulatory Visit: Payer: Self-pay

## 2019-12-05 ENCOUNTER — Encounter: Payer: Self-pay | Admitting: Family Medicine

## 2019-12-05 ENCOUNTER — Ambulatory Visit (INDEPENDENT_AMBULATORY_CARE_PROVIDER_SITE_OTHER): Payer: Medicare HMO | Admitting: Family Medicine

## 2019-12-05 DIAGNOSIS — E1165 Type 2 diabetes mellitus with hyperglycemia: Secondary | ICD-10-CM | POA: Diagnosis not present

## 2019-12-05 DIAGNOSIS — I152 Hypertension secondary to endocrine disorders: Secondary | ICD-10-CM

## 2019-12-05 DIAGNOSIS — E785 Hyperlipidemia, unspecified: Secondary | ICD-10-CM | POA: Diagnosis not present

## 2019-12-05 DIAGNOSIS — E1159 Type 2 diabetes mellitus with other circulatory complications: Secondary | ICD-10-CM

## 2019-12-05 LAB — POCT GLYCOSYLATED HEMOGLOBIN (HGB A1C): Hemoglobin A1C: 8.9 % — AB (ref 4.0–5.6)

## 2019-12-05 MED ORDER — SITAGLIPTIN PHOS-METFORMIN HCL 50-1000 MG PO TABS: 1.0000 | ORAL_TABLET | Freq: Two times a day (BID) | ORAL | 2 refills | Status: DC

## 2019-12-05 MED ORDER — BENAZEPRIL HCL 40 MG PO TABS: ORAL_TABLET | ORAL | 0 refills | Status: DC

## 2019-12-05 MED ORDER — ATORVASTATIN CALCIUM 10 MG PO TABS: 10.0000 mg | ORAL_TABLET | Freq: Every day | ORAL | 0 refills | Status: DC

## 2019-12-05 NOTE — Assessment & Plan Note (Signed)
Needs to be on statin. Labs ordered Statin low dose ordered.  Educated on benefits given he is diabetic.  Patient acknowledged agreement and understanding of the plan.

## 2019-12-05 NOTE — Assessment & Plan Note (Signed)
unchanged Bryan Wright is educated about the importance of exercise daily to help with weight management. A minumum of 30 minutes daily is recommended. Additionally, importance of healthy food choices  with portion control discussed.   Wt Readings from Last 3 Encounters:  12/05/19 261 lb (118.4 kg)  07/04/19 258 lb (117 kg)  03/23/19 258 lb (117 kg)

## 2019-12-05 NOTE — Patient Instructions (Addendum)
  HAPPY FALL!  I appreciate the opportunity to provide you with care for your health and wellness. Today we discussed: diabetes   Follow up: 3 months for CPE -fasting morning appt for labs same day   Labs- tomorrow morning fasting or referrals today A1c in office today _8.9%__  Blood pressure is elevated so going to increase your BP medication today   As well as STOP your metformin and put you on a combination pill for your  Diabetes as it is not well controlled.  Please look at the list today and see the changes.  Please continue to practice social distancing to keep you, your family, and our community safe.  If you must go out, please wear a mask and practice good handwashing.  It was a pleasure to see you and I look forward to continuing to work together on your health and well-being. Please do not hesitate to call the office if you need care or have questions about your care.  Have a wonderful day and week. With Gratitude, [name], DNP, AGNP-BC

## 2019-12-05 NOTE — Assessment & Plan Note (Signed)
A1c today in office is 8.9%  Adjustment to medications is made Starting statin today and increased BP medication as well. Follow up in 3 months for CPE for foot exam. Advised to get eye appt for DM Reviewed diet measures in office.

## 2019-12-05 NOTE — Progress Notes (Signed)
Subjective:  Patient ID: Bryan Wright, male    DOB: 09/14/1955  Age: 64 y.o. MRN: [id number]  CC:  Chief Complaint  Patient presents with  . Follow-up      HPI  HPI  Bryan Wright is a 64 year old male patient who presents today for follow-up.   Has a history as stated below.  Reports taking all his medications as directed. He reports that his fasting blood sugars are in the 02/18/1938 range.  And he tries to eat healthy.  He denies having any hypoglycemia.  He denies having any polydipsia, polyuria, polyphagia.  He denies having any urinary tract or infection-like symptoms.  He reports that he sleeping well.  He reports that his appetite is good.  He reports that he is using the restroom fine without blood or urine.  Skin is good and no wounds.  He is wearing his glasses.  He reports that he thinks his last eye appointment was earlier in the year.  And he should have was coming up here shortly.  But is unsure if he is ever had a diabetic eye exam.  Needs to get updated A1c.  Blood pressure elevated today in the office.  He reports that it is normal at other office visits.  In looking back he is always had on the higher end of normal.  We will look to do an adjustment of this today.  He is in agreements for this.  Additionally reviewed diabetic recommendations with him being on a statin medication.  It looks like he was suggested to be on a statin medication by Dr. Lodema Hong but did not want to be on one secondary to being told that it was a bad drug.  He denies having any active chest pain, headaches, leg swelling, dizziness, vision changes or headaches.  Today patient denies signs and symptoms of COVID 19 infection including fever, chills, cough, shortness of breath, and headache. Past Medical, Surgical, Social History, Allergies, and Medications have been Reviewed.   Past Medical History:  Diagnosis Date  . Diabetes mellitus dx in 2008  . Hypertension 2008    Current Meds    Medication Sig  . benazepril (LOTENSIN) 40 MG tablet TAKE 1 TABLET(40 MG) BY MOUTH DAILY  . fentaNYL (DURAGESIC - DOSED MCG/HR) 50 MCG/HR Place 50 mcg onto the skin every 3 (three) days.  Marland Kitchen glipiZIDE (GLIPIZIDE XL) 10 MG 24 hr tablet Take 1 tablet (10 mg total) by mouth daily with breakfast.  . Hydrocodone-Acetaminophen (VICODIN HP) 10-300 MG TABS to 2 tablets every 6 hours as needed Gets 365b tabs per 3 months, from pain clinic in [place] Nerve stimulator implanted in 2012 for chronic back pain with failed back surgery x 2  . Multiple Vitamin (THERA) TABS Take 2 tablets by mouth daily.  . sildenafil (REVATIO) 20 MG tablet DISSOLVE ONE TROCHE UNDERNEATH THE TONGUE 60 MINUTES PRIOR TO SEXUAL ENCOUNTER.  Marland Kitchen UNABLE TO FIND One touch meter and lancets Once daily testing dx e11.9  . [DISCONTINUED] benazepril (LOTENSIN) 20 MG tablet TAKE 1 TABLET(20 MG) BY MOUTH DAILY  . [DISCONTINUED] benazepril (LOTENSIN) 40 MG tablet TAKE 1 TABLET(20 MG) BY MOUTH DAILY  . [DISCONTINUED] metFORMIN (GLUCOPHAGE) 1000 MG tablet TAKE 1 TABLET BY MOUTH TWICE DAILY WITH A MEAL    ROS:  Review of Systems  Constitutional: Negative.   HENT: Negative.   Eyes: Negative.   Respiratory: Negative.   Cardiovascular: Negative.   Gastrointestinal: Negative.   Genitourinary: Negative.  Musculoskeletal: Negative.   Skin: Negative.   Neurological: Negative.   Endo/Heme/Allergies: Negative.   Psychiatric/Behavioral: Negative.      Objective:   Today's Vitals: BP (!) 150/80   Pulse 68   Temp 97.6 F (36.4 C) (Temporal)   Ht 5' 10.5" (1.791 m)   Wt 261 lb (118.4 kg)   SpO2 95%   BMI 36.92 kg/m  Vitals with BMI 12/05/2019 12/05/2019 07/04/2019  Height - 5' 10.5" 5' 10.5"  Weight - 261 lbs 258 lbs  BMI - 36.91 36.48  Systolic 150 158 939  Diastolic 80 80 54  Pulse - 68 -     Physical Exam Vitals and nursing note reviewed.  Constitutional:      Appearance: Normal appearance. He is well-developed and  well-groomed. He is obese.  HENT:     Head: Normocephalic and atraumatic.     Right Ear: External ear normal.     Left Ear: External ear normal.     Mouth/Throat:     Comments: Mask in place  Eyes:     General:        Right eye: No discharge.        Left eye: No discharge.     Conjunctiva/sclera: Conjunctivae normal.  Cardiovascular:     Rate and Rhythm: Normal rate and regular rhythm.     Pulses: Normal pulses.     Heart sounds: Normal heart sounds.  Pulmonary:     Effort: Pulmonary effort is normal.     Breath sounds: Normal breath sounds.  Musculoskeletal:        General: Normal range of motion.     Cervical back: Normal range of motion and neck supple.  Skin:    General: Skin is warm.  Neurological:     General: No focal deficit present.     Mental Status: He is alert and oriented to person, place, and time.  Psychiatric:        Attention and Perception: Attention normal.        Mood and Affect: Mood normal.        Speech: Speech normal.        Behavior: Behavior normal. Behavior is cooperative.        Thought Content: Thought content normal.        Cognition and Memory: Cognition normal.        Judgment: Judgment normal.     Assessment   1. Morbid obesity (HCC)   2. Type 2 diabetes mellitus with hyperglycemia, without long-term current use of insulin (HCC)   3. Hypertension associated with diabetes (HCC)   4. Dyslipidemia, goal LDL below 70     Tests ordered Orders Placed This Encounter  Procedures  . CBC  . Comprehensive metabolic panel  . Lipid panel  . POCT glycosylated hemoglobin (Hb A1C)     Plan: Please see assessment and plan per problem list above.   Meds ordered this encounter  Medications  . atorvastatin (LIPITOR) 10 MG tablet    Sig: Take 1 tablet (10 mg total) by mouth daily.    Dispense:  90 tablet    Refill:  0    Order Specific Question:   Supervising Provider    Answer:   SIMPSON, MARGARET E [[id number]]  . DISCONTD: benazepril  (LOTENSIN) 40 MG tablet    Sig: TAKE 1 TABLET(20 MG) BY MOUTH DAILY    Dispense:  90 tablet    Refill:  0    Dose change  Order Specific Question:   Supervising Provider    Answer:   Kerri Perches [[id number]]  . benazepril (LOTENSIN) 40 MG tablet    Sig: TAKE 1 TABLET(40 MG) BY MOUTH DAILY    Dispense:  90 tablet    Refill:  0    Dose change    Order Specific Question:   Supervising Provider    Answer:   SIMPSON, MARGARET E [[id number]]  . sitaGLIPtin-metformin (JANUMET) 50-1000 MG tablet    Sig: Take 1 tablet by mouth 2 (two) times daily with a meal.    Dispense:  60 tablet    Refill:  2    Order Specific Question:   Supervising Provider    Answer:   Kerri Perches [[id number]]    Patient to follow-up in 3 months CPE   Note: This dictation was prepared with Dragon dictation along with smaller phrase technology. Similar sounding words can be transcribed inadequately or may not be corrected upon review. Any transcriptional errors that result from this process are unintentional.      Freddy Finner, NP

## 2019-12-05 NOTE — Assessment & Plan Note (Signed)
Repeat labs ordered today.  Blood pressure is elevated today. Adjustment of medication made today.  Encouraged DASH diet and 30 minutes of exercise at least 5 days a week.

## 2019-12-06 ENCOUNTER — Encounter: Payer: Self-pay | Admitting: Nurse Practitioner

## 2019-12-06 ENCOUNTER — Ambulatory Visit (INDEPENDENT_AMBULATORY_CARE_PROVIDER_SITE_OTHER): Payer: Medicare HMO | Admitting: Nurse Practitioner

## 2019-12-06 DIAGNOSIS — Z0000 Encounter for general adult medical examination without abnormal findings: Secondary | ICD-10-CM | POA: Diagnosis not present

## 2019-12-06 DIAGNOSIS — Z1211 Encounter for screening for malignant neoplasm of colon: Secondary | ICD-10-CM

## 2019-12-06 NOTE — Patient Instructions (Signed)
Mr. Bryan Wright , Thank you for taking time to come for your Medicare Wellness Visit. I appreciate your ongoing commitment to your health goals. Please review the following plan we discussed and let me know if I can assist you in the future.   Screening recommendations/referrals: Colonoscopy: Referral sent to GI today.   Recommended yearly ophthalmology/optometry visit for glaucoma screening and checkup Recommended yearly dental visit for hygiene and checkup  Vaccinations: Influenza vaccine:Complete  Pneumococcal vaccine: Complete  Tdap vaccine: Complete; DUE 04/16/2027  Shingles vaccine: Education provided.     Advanced directives: N/A   Conditions/risks identified: NONE   Next appointment: 03/06/20 @ 11:00 am with [name], NP   Preventive Care 40-64 Years, Male Preventive care refers to lifestyle choices and visits with your health care provider that can promote health and wellness. What does preventive care include?  A yearly physical exam. This is also called an annual well check.  Dental exams once or twice a year.  Routine eye exams. Ask your health care provider how often you should have your eyes checked.  Personal lifestyle choices, including:  Daily care of your teeth and gums.  Regular physical activity.  Eating a healthy diet.  Avoiding tobacco and drug use.  Limiting alcohol use.  Practicing safe sex.  Taking low-dose aspirin every day starting at age 2. What happens during an annual well check? The services and screenings done by your health care provider during your annual well check will depend on your age, overall health, lifestyle risk factors, and family history of disease. Counseling  Your health care provider may ask you questions about your:  Alcohol use.  Tobacco use.  Drug use.  Emotional well-being.  Home and relationship well-being.  Sexual activity.  Eating habits.  Work and work Astronomer. Screening  You may have the  following tests or measurements:  Height, weight, and BMI.  Blood pressure.  Lipid and cholesterol levels. These may be checked every 5 years, or more frequently if you are over 36 years old.  Skin check.  Lung cancer screening. You may have this screening every year starting at age 82 if you have a 30-pack-year history of smoking and currently smoke or have quit within the past 15 years.  Fecal occult blood test (FOBT) of the stool. You may have this test every year starting at age 50.  Flexible sigmoidoscopy or colonoscopy. You may have a sigmoidoscopy every 5 years or a colonoscopy every 10 years starting at age 41.  Prostate cancer screening. Recommendations will vary depending on your family history and other risks.  Hepatitis C blood test.  Hepatitis B blood test.  Sexually transmitted disease (STD) testing.  Diabetes screening. This is done by checking your blood sugar (glucose) after you have not eaten for a while (fasting). You may have this done every 1-3 years. Discuss your test results, treatment options, and if necessary, the need for more tests with your health care provider. Vaccines  Your health care provider may recommend certain vaccines, such as:  Influenza vaccine. This is recommended every year.  Tetanus, diphtheria, and acellular pertussis (Tdap, Td) vaccine. You may need a Td booster every 10 years.  Zoster vaccine. You may need this after age 72.  Pneumococcal 13-valent conjugate (PCV13) vaccine. You may need this if you have certain conditions and have not been vaccinated.  Pneumococcal polysaccharide (PPSV23) vaccine. You may need one or two doses if you smoke cigarettes or if you have certain conditions. Talk to your  health care provider about which screenings and vaccines you need and how often you need them. This information is not intended to replace advice given to you by your health care provider. Make sure you discuss any questions you have with  your health care provider. Document Released: 02/01/2015 Document Revised: 09/25/2015 Document Reviewed: 11/06/2014 Elsevier Interactive Patient Education  2017 [place] Prevention in the Home Falls can cause injuries. They can happen to people of all ages. There are many things you can do to make your home safe and to help prevent falls. What can I do on the outside of my home?  Regularly fix the edges of walkways and driveways and fix any cracks.  Remove anything that might make you trip as you walk through a door, such as a raised step or threshold.  Trim any bushes or trees on the path to your home.  Use bright outdoor lighting.  Clear any walking paths of anything that might make someone trip, such as rocks or tools.  Regularly check to see if handrails are loose or broken. Make sure that both sides of any steps have handrails.  Any raised decks and porches should have guardrails on the edges.  Have any leaves, snow, or ice cleared regularly.  Use sand or salt on walking paths during winter.  Clean up any spills in your garage right away. This includes oil or grease spills. What can I do in the bathroom?  Use night lights.  Install grab bars by the toilet and in the tub and shower. Do not use towel bars as grab bars.  Use non-skid mats or decals in the tub or shower.  If you need to sit down in the shower, use a plastic, non-slip stool.  Keep the floor dry. Clean up any water that spills on the floor as soon as it happens.  Remove soap buildup in the tub or shower regularly.  Attach bath mats securely with double-sided non-slip rug tape.  Do not have throw rugs and other things on the floor that can make you trip. What can I do in the bedroom?  Use night lights.  Make sure that you have a light by your bed that is easy to reach.  Do not use any sheets or blankets that are too big for your bed. They should not hang down onto the floor.  Have a firm  chair that has side arms. You can use this for support while you get dressed.  Do not have throw rugs and other things on the floor that can make you trip. What can I do in the kitchen?  Clean up any spills right away.  Avoid walking on wet floors.  Keep items that you use a lot in easy-to-reach places.  If you need to reach something above you, use a strong step stool that has a grab bar.  Keep electrical cords out of the way.  Do not use floor polish or wax that makes floors slippery. If you must use wax, use non-skid floor wax.  Do not have throw rugs and other things on the floor that can make you trip. What can I do with my stairs?  Do not leave any items on the stairs.  Make sure that there are handrails on both sides of the stairs and use them. Fix handrails that are broken or loose. Make sure that handrails are as long as the stairways.  Check any carpeting to make sure that it is  firmly attached to the stairs. Fix any carpet that is loose or worn.  Avoid having throw rugs at the top or bottom of the stairs. If you do have throw rugs, attach them to the floor with carpet tape.  Make sure that you have a light switch at the top of the stairs and the bottom of the stairs. If you do not have them, ask someone to add them for you. What else can I do to help prevent falls?  Wear shoes that:  Do not have high heels.  Have rubber bottoms.  Are comfortable and fit you well.  Are closed at the toe. Do not wear sandals.  If you use a stepladder:  Make sure that it is fully opened. Do not climb a closed stepladder.  Make sure that both sides of the stepladder are locked into place.  Ask someone to hold it for you, if possible.  Clearly mark and make sure that you can see:  Any grab bars or handrails.  First and last steps.  Where the edge of each step is.  Use tools that help you move around (mobility aids) if they are needed. These  include:  Canes.  Walkers.  Scooters.  Crutches.  Turn on the lights when you go into a dark area. Replace any light bulbs as soon as they burn out.  Set up your furniture so you have a clear path. Avoid moving your furniture around.  If any of your floors are uneven, fix them.  If there are any pets around you, be aware of where they are.  Review your medicines with your doctor. Some medicines can make you feel dizzy. This can increase your chance of falling. Ask your doctor what other things that you can do to help prevent falls. This information is not intended to replace advice given to you by your health care provider. Make sure you discuss any questions you have with your health care provider. Document Released: 11/01/2008 Document Revised: 06/13/2015 Document Reviewed: 02/09/2014 Elsevier Interactive Patient Education  2017 [employer].

## 2019-12-06 NOTE — Progress Notes (Signed)
Subjective:   Bryan Wright is a 64 y.o. male who presents for Medicare Annual/Subsequent preventive examination.    Objective:    There were no vitals filed for this visit. There is no height or weight on file to calculate BMI.  Advanced Directives 04/15/2016  Does Patient Have a Medical Advance Directive? No  Would patient like information on creating a medical advance directive? No - Patient declined    Current Medications (verified) Outpatient Encounter Medications as of 12/06/2019  Medication Sig  . atorvastatin (LIPITOR) 10 MG tablet Take 1 tablet (10 mg total) by mouth daily.  . benazepril (LOTENSIN) 40 MG tablet TAKE 1 TABLET(40 MG) BY MOUTH DAILY  . fentaNYL (DURAGESIC - DOSED MCG/HR) 50 MCG/HR Place 50 mcg onto the skin every 3 (three) days.  Marland Kitchen glipiZIDE (GLIPIZIDE XL) 10 MG 24 hr tablet Take 1 tablet (10 mg total) by mouth daily with breakfast.  . Hydrocodone-Acetaminophen (VICODIN HP) 10-300 MG TABS to 2 tablets every 6 hours as needed Gets 365b tabs per 3 months, from pain clinic in [place] Nerve stimulator implanted in 2012 for chronic back pain with failed back surgery x 2  . Multiple Vitamin (THERA) TABS Take 2 tablets by mouth daily.  . sildenafil (REVATIO) 20 MG tablet DISSOLVE ONE TROCHE UNDERNEATH THE TONGUE 60 MINUTES PRIOR TO SEXUAL ENCOUNTER.  Marland Kitchen sitaGLIPtin-metformin (JANUMET) 50-1000 MG tablet Take 1 tablet by mouth 2 (two) times daily with a meal.  . UNABLE TO FIND One touch meter and lancets Once daily testing dx e11.9   No facility-administered encounter medications on file as of 12/06/2019.    Allergies (verified) Patient has no known allergies.   History: Past Medical History:  Diagnosis Date  . Diabetes mellitus dx in 2008  . Hypertension 2008   Past Surgical History:  Procedure Laterality Date  . APPENDECTOMY     early 20's  . CERVICAL DISC SURGERY    . LUNG SURGERY    . SPINE SURGERY  2002   cervical  2002  . SPINE SURGERY   2009, 2010   lumbar   No family history on file. Social History   Socioeconomic History  . Marital status: Married    Spouse name: Rosalita Chessman   . Number of children: 1  . Years of education: Not on file  . Highest education level: 8th grade  Occupational History  . Not on file  Tobacco Use  . Smoking status: Former Smoker    Packs/day: 1.50    Years: 5.00    Pack years: 7.50    Types: Cigarettes    Quit date: 1983    Years since quitting: 38.9  . Smokeless tobacco: Former Neurosurgeon    Types: Chew    Quit date: 06/18/2015  Vaping Use  . Vaping Use: Never used  Substance and Sexual Activity  . Alcohol use: No    Alcohol/week: 0.0 standard drinks  . Drug use: No  . Sexual activity: Not Currently  Other Topics Concern  . Not on file  Social History Narrative   Lives with wife (in hospital currently rehab)      Two dogs: Precious and [place]    Lots of rabbits      Son and two grandkids live behind you       Enjoys outside, visit friends,      Diet: not good, eats meats, fast food since wife has been sick   Caffeine: diet soda-usually caffeine free, tea    Water:  2-3 bottles daily       Wears seat belt   Smoke detectors at home   Does not use phone while driving    Social Determinants of Health   Financial Resource Strain:   . Difficulty of Paying Living Expenses: Not on file  Food Insecurity:   . Worried About Programme researcher, broadcasting/film/videounning Out of Food in the Last Year: Not on file  . Ran Out of Food in the Last Year: Not on file  Transportation Needs:   . Lack of Transportation (Medical): Not on file  . Lack of Transportation (Non-Medical): Not on file  Physical Activity:   . Days of Exercise per Week: Not on file  . Minutes of Exercise per Session: Not on file  Stress:   . Feeling of Stress : Not on file  Social Connections:   . Frequency of Communication with Friends and Family: Not on file  . Frequency of Social Gatherings with Friends and Family: Not on file  . Attends Religious  Services: Not on file  . Active Member of Clubs or Organizations: Not on file  . Attends BankerClub or Organization Meetings: Not on file  . Marital Status: Not on file    Tobacco Counseling Counseling given: Not Answered                   Diabetic? Yes         Activities of Daily Living In your present state of health, do you have any difficulty performing the following activities: 07/04/2019  Hearing? N  Vision? N  Difficulty concentrating or making decisions? N  Walking or climbing stairs? N  Dressing or bathing? N  Doing errands, shopping? N  Some recent data might be hidden    Patient Care Team: Freddy FinnerMills, Hannah M, NP as PCP - General (Family Medicine) Jethro BolusShapiro, Mark, MD as Consulting Physician (Ophthalmology) Cannon KettleGilmore, Christopher, MD as Referring Physician (Pain Medicine)  Indicate any recent Medical Services you may have received from other than [facility] providers in the past year (date may be approximate).     Assessment:   This is a routine wellness examination for [name].  Hearing/Vision screen No exam data present  Dietary issues and exercise activities discussed:    Goals    . Increase water intake     Increase your water intake 64 ounces a day.       Depression Screen PHQ 2/9 Scores 12/05/2019 07/04/2019 03/23/2019 10/13/2018 04/15/2016 12/19/2015 09/18/2013  PHQ - 2 Score 0 0 0 0 0 0 0    Fall Risk Fall Risk  12/05/2019 07/04/2019 03/23/2019 10/13/2018 04/15/2016  Falls in the past year? 0 0 0 0 No  Number falls in past yr: 0 0 0 0 -  Injury with Fall? 0 0 0 0 -  Risk for fall due to : No Fall Risks No Fall Risks - - -  Follow up Falls evaluation completed Falls evaluation completed - - -    Any stairs in or around the home? No  If so, are there any without handrails? No  Home free of loose throw rugs in walkways, pet beds, electrical cords, etc? Yes  Adequate lighting in your home to reduce risk of falls? Yes   ASSISTIVE DEVICES UTILIZED TO  PREVENT FALLS:  Life alert? No  Use of a cane, walker or w/c? No  Grab bars in the bathroom? No  Shower chair or bench in shower? No  Elevated toilet seat or a handicapped toilet? No  TIMED UP AND GO:  Was the test performed? No .         6CIT Screen 04/15/2016  What Year? 0 points  What month? 0 points  What time? 0 points  Count back from 20 0 points  Months in reverse 0 points  Repeat phrase 0 points  Total Score 0    Immunizations Immunization History  Administered Date(s) Administered  . Influenza Whole 12/02/2009  . Influenza, Seasonal, Injecte, Preservative Fre 09/18/2013, 03/20/2015  . Influenza,inj,Quad PF,6+ Mos 09/18/2013, 03/20/2015, 11/25/2016, 10/19/2017, 10/13/2018, 11/21/2019  . Pneumococcal Polysaccharide-23 12/02/2009, 03/20/2015    TDAP status: Up to date Flu Vaccine status: Up to date Pneumococcal vaccine status: Up to date Covid-19 vaccine status: Completed vaccines  Qualifies for Shingles Vaccine? Yes   Zostavax completed No   Shingrix Completed?: No.    Education has been provided regarding the importance of this vaccine. Patient has been advised to call insurance company to determine out of pocket expense if they have not yet received this vaccine. Advised may also receive vaccine at local pharmacy or Health Dept. Verbalized acceptance and understanding.  Screening Tests Health Maintenance  Topic Date Due  . COVID-19 Vaccine (1) Never done  . COLONOSCOPY  Never done  . FOOT EXAM  06/14/2017  . TETANUS/TDAP  04/16/2027 (Originally 01/10/1975)  . OPHTHALMOLOGY EXAM  02/06/2020  . HEMOGLOBIN A1C  06/03/2020  . INFLUENZA VACCINE  Completed  . PNEUMOCOCCAL POLYSACCHARIDE VACCINE AGE 5-64 HIGH RISK  Completed  . Hepatitis C Screening  Completed  . HIV Screening  Completed    Health Maintenance  Health Maintenance Due  Topic Date Due  . COVID-19 Vaccine (1) Never done  . COLONOSCOPY  Never done  . FOOT EXAM  06/14/2017    Colorectal  cancer screening: Referral to GI placed today. Pt aware the office will call re: appt.  Lung Cancer Screening: (Low Dose CT Chest recommended if Age 56-80 years, 30 pack-year currently smoking OR have quit w/in 15years.) does not qualify.    Additional Screening:  Hepatitis C Screening: does qualify; Completed.   Vision Screening: Recommended annual ophthalmology exams for early detection of glaucoma and other disorders of the eye.  Is the patient up to date with their annual eye exam?  Yes    Who is the provider or what is the name of the office in which the patient attends annual eye exams? [facility] [place], [place]   If pt is not established with a provider, would they like to be referred to a provider to establish care? No .   Dental Screening: Recommended annual dental exams for proper oral hygiene  Community Resource Referral / Chronic Care Management: CRR required this visit?  No   CCM required this visit?  No      Plan:     I have personally reviewed and noted the following in the patient's chart:   . Medical and social history . Use of alcohol, tobacco or illicit drugs  . Current medications and supplements . Functional ability and status . Nutritional status . Physical activity . Advanced directives . List of other physicians . Hospitalizations, surgeries, and ER visits in previous 12 months . Vitals . Screenings to include cognitive, depression, and falls . Referrals and appointments  In addition, I have reviewed and discussed with patient certain preventive protocols, quality metrics, and best practice recommendations. A written personalized care plan for preventive services as well as general preventive health recommendations were provided to patient.  Dellia Cloud, LPN   [id number]   Nurse Notes: AWV conducted over the phone with pt consent to televisit via audio. Pt present in "cafe out to breakfast" at the time of call and provider present in the office.  This call took approx 20 min. Pt requested colonoscopy to be scheduled.     Date:  12/06/2019   Location of Patient: Home Location of Provider: Office Consent was obtain for visit to be over via telehealth. I verified that I am speaking with the correct person using two identifiers.  I connected with  JAKADEN OUZTS on 12/06/19 via telephone and verified that I am speaking with the correct person using two identifiers.   I discussed the limitations of evaluation and management by telemedicine. The patient expressed understanding and agreed to proceed.

## 2019-12-11 ENCOUNTER — Encounter (INDEPENDENT_AMBULATORY_CARE_PROVIDER_SITE_OTHER): Payer: Self-pay | Admitting: *Deleted

## 2019-12-27 DIAGNOSIS — M5441 Lumbago with sciatica, right side: Secondary | ICD-10-CM | POA: Diagnosis not present

## 2019-12-27 DIAGNOSIS — M961 Postlaminectomy syndrome, not elsewhere classified: Secondary | ICD-10-CM | POA: Diagnosis not present

## 2019-12-27 DIAGNOSIS — Z9689 Presence of other specified functional implants: Secondary | ICD-10-CM | POA: Diagnosis not present

## 2019-12-27 DIAGNOSIS — G8929 Other chronic pain: Secondary | ICD-10-CM | POA: Diagnosis not present

## 2019-12-27 DIAGNOSIS — G894 Chronic pain syndrome: Secondary | ICD-10-CM | POA: Diagnosis not present

## 2019-12-27 DIAGNOSIS — M5416 Radiculopathy, lumbar region: Secondary | ICD-10-CM | POA: Diagnosis not present

## 2020-02-27 DIAGNOSIS — Z9689 Presence of other specified functional implants: Secondary | ICD-10-CM | POA: Diagnosis not present

## 2020-02-27 DIAGNOSIS — M961 Postlaminectomy syndrome, not elsewhere classified: Secondary | ICD-10-CM | POA: Diagnosis not present

## 2020-02-27 DIAGNOSIS — G8929 Other chronic pain: Secondary | ICD-10-CM | POA: Diagnosis not present

## 2020-02-27 DIAGNOSIS — M5441 Lumbago with sciatica, right side: Secondary | ICD-10-CM | POA: Diagnosis not present

## 2020-02-27 DIAGNOSIS — Z5181 Encounter for therapeutic drug level monitoring: Secondary | ICD-10-CM | POA: Diagnosis not present

## 2020-02-27 DIAGNOSIS — M5416 Radiculopathy, lumbar region: Secondary | ICD-10-CM | POA: Diagnosis not present

## 2020-02-27 DIAGNOSIS — G894 Chronic pain syndrome: Secondary | ICD-10-CM | POA: Diagnosis not present

## 2020-02-27 DIAGNOSIS — Z79899 Other long term (current) drug therapy: Secondary | ICD-10-CM | POA: Diagnosis not present

## 2020-03-04 ENCOUNTER — Other Ambulatory Visit: Payer: Self-pay | Admitting: Family Medicine

## 2020-03-04 DIAGNOSIS — E785 Hyperlipidemia, unspecified: Secondary | ICD-10-CM

## 2020-03-06 ENCOUNTER — Encounter: Payer: Medicare HMO | Admitting: Family Medicine

## 2020-03-13 ENCOUNTER — Encounter: Payer: Medicare HMO | Admitting: Nurse Practitioner

## 2020-04-23 DIAGNOSIS — Z9689 Presence of other specified functional implants: Secondary | ICD-10-CM | POA: Diagnosis not present

## 2020-04-23 DIAGNOSIS — M5416 Radiculopathy, lumbar region: Secondary | ICD-10-CM | POA: Diagnosis not present

## 2020-04-23 DIAGNOSIS — G8929 Other chronic pain: Secondary | ICD-10-CM | POA: Diagnosis not present

## 2020-04-23 DIAGNOSIS — M5441 Lumbago with sciatica, right side: Secondary | ICD-10-CM | POA: Diagnosis not present

## 2020-04-23 DIAGNOSIS — M961 Postlaminectomy syndrome, not elsewhere classified: Secondary | ICD-10-CM | POA: Diagnosis not present

## 2020-04-23 DIAGNOSIS — G894 Chronic pain syndrome: Secondary | ICD-10-CM | POA: Diagnosis not present

## 2020-06-24 DIAGNOSIS — Z9689 Presence of other specified functional implants: Secondary | ICD-10-CM | POA: Diagnosis not present

## 2020-06-24 DIAGNOSIS — M5441 Lumbago with sciatica, right side: Secondary | ICD-10-CM | POA: Diagnosis not present

## 2020-06-24 DIAGNOSIS — M961 Postlaminectomy syndrome, not elsewhere classified: Secondary | ICD-10-CM | POA: Diagnosis not present

## 2020-06-24 DIAGNOSIS — M5416 Radiculopathy, lumbar region: Secondary | ICD-10-CM | POA: Diagnosis not present

## 2020-06-24 DIAGNOSIS — G894 Chronic pain syndrome: Secondary | ICD-10-CM | POA: Diagnosis not present

## 2020-06-24 DIAGNOSIS — G8929 Other chronic pain: Secondary | ICD-10-CM | POA: Diagnosis not present

## 2020-06-25 ENCOUNTER — Other Ambulatory Visit: Payer: Self-pay | Admitting: *Deleted

## 2020-06-25 DIAGNOSIS — I152 Hypertension secondary to endocrine disorders: Secondary | ICD-10-CM

## 2020-06-25 DIAGNOSIS — E785 Hyperlipidemia, unspecified: Secondary | ICD-10-CM

## 2020-06-25 DIAGNOSIS — E1159 Type 2 diabetes mellitus with other circulatory complications: Secondary | ICD-10-CM

## 2020-06-25 DIAGNOSIS — E119 Type 2 diabetes mellitus without complications: Secondary | ICD-10-CM

## 2020-06-25 MED ORDER — BENAZEPRIL HCL 40 MG PO TABS: ORAL_TABLET | ORAL | 0 refills | Status: DC

## 2020-06-25 MED ORDER — GLIPIZIDE ER 10 MG PO TB24: 10.0000 mg | ORAL_TABLET | Freq: Every day | ORAL | 1 refills | Status: DC

## 2020-06-25 MED ORDER — ATORVASTATIN CALCIUM 10 MG PO TABS: ORAL_TABLET | ORAL | 0 refills | Status: DC

## 2020-07-16 ENCOUNTER — Other Ambulatory Visit: Payer: Self-pay

## 2020-07-16 ENCOUNTER — Ambulatory Visit: Payer: Medicare HMO

## 2020-07-16 LAB — HM DIABETES EYE EXAM

## 2020-07-18 ENCOUNTER — Encounter: Payer: Self-pay | Admitting: *Deleted

## 2020-07-21 ENCOUNTER — Emergency Department (HOSPITAL_COMMUNITY): Payer: Medicare HMO

## 2020-07-21 ENCOUNTER — Emergency Department (HOSPITAL_COMMUNITY)
Admission: EM | Admit: 2020-07-21 | Discharge: 2020-07-21 | Disposition: A | Discharge status: Home / Self Care | Payer: Medicare HMO | Attending: Emergency Medicine | Admitting: Emergency Medicine

## 2020-07-21 ENCOUNTER — Encounter (HOSPITAL_COMMUNITY): Payer: Self-pay

## 2020-07-21 ENCOUNTER — Other Ambulatory Visit: Payer: Self-pay

## 2020-07-21 DIAGNOSIS — E871 Hypo-osmolality and hyponatremia: Secondary | ICD-10-CM | POA: Insufficient documentation

## 2020-07-21 DIAGNOSIS — Z87891 Personal history of nicotine dependence: Secondary | ICD-10-CM | POA: Insufficient documentation

## 2020-07-21 DIAGNOSIS — R0789 Other chest pain: Secondary | ICD-10-CM | POA: Diagnosis not present

## 2020-07-21 DIAGNOSIS — I10 Essential (primary) hypertension: Secondary | ICD-10-CM | POA: Diagnosis not present

## 2020-07-21 DIAGNOSIS — R079 Chest pain, unspecified: Secondary | ICD-10-CM | POA: Diagnosis not present

## 2020-07-21 DIAGNOSIS — E1165 Type 2 diabetes mellitus with hyperglycemia: Secondary | ICD-10-CM | POA: Insufficient documentation

## 2020-07-21 DIAGNOSIS — Z7984 Long term (current) use of oral hypoglycemic drugs: Secondary | ICD-10-CM | POA: Diagnosis not present

## 2020-07-21 DIAGNOSIS — M542 Cervicalgia: Secondary | ICD-10-CM | POA: Diagnosis not present

## 2020-07-21 DIAGNOSIS — Y92410 Unspecified street and highway as the place of occurrence of the external cause: Secondary | ICD-10-CM | POA: Insufficient documentation

## 2020-07-21 DIAGNOSIS — I700 Atherosclerosis of aorta: Secondary | ICD-10-CM | POA: Diagnosis not present

## 2020-07-21 DIAGNOSIS — Z79899 Other long term (current) drug therapy: Secondary | ICD-10-CM | POA: Insufficient documentation

## 2020-07-21 LAB — BASIC METABOLIC PANEL
Anion gap: 7 (ref 5–15)
BUN: 16 mg/dL (ref 8–23)
CO2: 26 mmol/L (ref 22–32)
Calcium: 9.1 mg/dL (ref 8.9–10.3)
Chloride: 101 mmol/L (ref 98–111)
Creatinine, Ser: 0.9 mg/dL (ref 0.61–1.24)
GFR, Estimated: >60 mL/min (ref >60)
Glucose, Bld: 283 mg/dL — ABNORMAL HIGH (ref 70–99)
Potassium: 4 mmol/L (ref 3.5–5.1)
Sodium: 134 mmol/L — ABNORMAL LOW (ref 135–145)

## 2020-07-21 LAB — CBC WITH DIFFERENTIAL/PLATELET
Abs Immature Granulocytes: 0.05 10*3/uL (ref 0.00–0.07)
Basophils Absolute: 0.1 10*3/uL (ref 0.0–0.1)
Basophils Relative: 1 %
Eosinophils Absolute: 0.2 10*3/uL (ref 0.0–0.5)
Eosinophils Relative: 2 %
HCT: 43.5 % (ref 39.0–52.0)
Hemoglobin: 15 g/dL (ref 13.0–17.0)
Immature Granulocytes: 1 %
Lymphocytes Relative: 16 %
Lymphs Abs: 1.7 10*3/uL (ref 0.7–4.0)
MCH: 31.1 pg (ref 26.0–34.0)
MCHC: 34.5 g/dL (ref 30.0–36.0)
MCV: 90.1 fL (ref 80.0–100.0)
Monocytes Absolute: 0.7 10*3/uL (ref 0.1–1.0)
Monocytes Relative: 6 %
Neutro Abs: 7.8 10*3/uL — ABNORMAL HIGH (ref 1.7–7.7)
Neutrophils Relative %: 74 %
Platelets: 217 10*3/uL (ref 150–400)
RBC: 4.83 MIL/uL (ref 4.22–5.81)
RDW: 12.8 % (ref 11.5–15.5)
WBC: 10.5 10*3/uL (ref 4.0–10.5)
nRBC: 0 % (ref 0.0–0.2)

## 2020-07-21 LAB — I-STAT CHEM 8, ED
BUN: 15 mg/dL (ref 8–23)
Calcium, Ion: 1.26 mmol/L (ref 1.15–1.40)
Chloride: 100 mmol/L (ref 98–111)
Creatinine, Ser: 0.8 mg/dL (ref 0.61–1.24)
Glucose, Bld: 269 mg/dL — ABNORMAL HIGH (ref 70–99)
HCT: 45 % (ref 39.0–52.0)
Hemoglobin: 15.3 g/dL (ref 13.0–17.0)
Potassium: 4.2 mmol/L (ref 3.5–5.1)
Sodium: 140 mmol/L (ref 135–145)
TCO2: 27 mmol/L (ref 22–32)

## 2020-07-21 LAB — PROTIME-INR
INR: 1.1 (ref 0.8–1.2)
Prothrombin Time: 13.8 seconds (ref 11.4–15.2)

## 2020-07-21 LAB — TROPONIN I (HIGH SENSITIVITY): Troponin I (High Sensitivity): 5 ng/L (ref <18)

## 2020-07-21 MED ORDER — IOHEXOL 300 MG/ML  SOLN
75.0000 mL | Freq: Once | INTRAMUSCULAR | Status: AC | PRN
  Administered 2020-07-21: 75 mL via INTRAVENOUS

## 2020-07-21 MED ORDER — CYCLOBENZAPRINE HCL 10 MG PO TABS: 10.0000 mg | ORAL_TABLET | Freq: Two times a day (BID) | ORAL | 0 refills | Status: AC | PRN

## 2020-07-21 MED ORDER — IOHEXOL 350 MG/ML SOLN: 75.0000 mL | Freq: Once | INTRAVENOUS | Status: DC | PRN
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
## 2020-07-21 NOTE — Discharge Instructions (Addendum)
suspect you have a muscular strain of your chest I have started you on a muscle relaxer please take as prescribed.  This medication make you drowsy do not consume alcohol or operate heavy machinery when taking this medication.  You may also take over-the-counter pain medications as needed.  Please follow-up your primary care provider about the goiter seen on your chest x-ray as well as the groundglass opacities.  Please follow-up in 1 week's time.  Come back to the emergency department if you develop chest pain, shortness of breath, severe abdominal pain, uncontrolled nausea, vomiting, diarrhea.

## 2020-08-06 ENCOUNTER — Ambulatory Visit: Payer: Medicare HMO | Admitting: Internal Medicine

## 2020-08-16 ENCOUNTER — Ambulatory Visit (INDEPENDENT_AMBULATORY_CARE_PROVIDER_SITE_OTHER): Payer: Medicare HMO | Admitting: Nurse Practitioner

## 2020-08-16 ENCOUNTER — Other Ambulatory Visit: Payer: Self-pay

## 2020-08-16 ENCOUNTER — Encounter: Payer: Self-pay | Admitting: Nurse Practitioner

## 2020-08-16 ENCOUNTER — Other Ambulatory Visit: Payer: Self-pay | Admitting: Nurse Practitioner

## 2020-08-16 VITALS — BP 160/69 | HR 72 | Temp 97.7°F | Ht 70.0 in | Wt 259.0 lb

## 2020-08-16 DIAGNOSIS — E049 Nontoxic goiter, unspecified: Secondary | ICD-10-CM | POA: Insufficient documentation
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
## 2020-08-20 ENCOUNTER — Other Ambulatory Visit: Payer: Self-pay | Admitting: Nurse Practitioner

## 2020-08-20 DIAGNOSIS — R946 Abnormal results of thyroid function studies: Secondary | ICD-10-CM

## 2020-08-20 DIAGNOSIS — E1165 Type 2 diabetes mellitus with hyperglycemia: Secondary | ICD-10-CM

## 2020-08-20 LAB — CBC WITH DIFFERENTIAL/PLATELET
Basophils Absolute: 0 10*3/uL (ref 0.0–0.2)
Basos: 0 %
EOS (ABSOLUTE): 0.3 10*3/uL (ref 0.0–0.4)
Eos: 3 %
Hematocrit: 42.6 % (ref 37.5–51.0)
Hemoglobin: 14.5 g/dL (ref 13.0–17.7)
Immature Grans (Abs): 0.1 10*3/uL (ref 0.0–0.1)
Immature Granulocytes: 1 %
Lymphocytes Absolute: 1.6 10*3/uL (ref 0.7–3.1)
Lymphs: 16 %
MCH: 30.7 pg (ref 26.6–33.0)
MCHC: 34 g/dL (ref 31.5–35.7)
MCV: 90 fL (ref 79–97)
Monocytes Absolute: 0.7 10*3/uL (ref 0.1–0.9)
Monocytes: 7 %
Neutrophils Absolute: 7.2 10*3/uL — ABNORMAL HIGH (ref 1.4–7.0)
Neutrophils: 73 %
Platelets: 205 10*3/uL (ref 150–450)
RBC: 4.72 x10E6/uL (ref 4.14–5.80)
RDW: 12.9 % (ref 11.6–15.4)
WBC: 9.8 10*3/uL (ref 3.4–10.8)

## 2020-08-20 LAB — TSH+T4F+T3FREE
Free T4: 1.28 ng/dL (ref 0.82–1.77)
T3, Free: 3 pg/mL (ref 2.0–4.4)
TSH: 0.414 u[IU]/mL — ABNORMAL LOW (ref 0.450–4.500)

## 2020-08-20 LAB — BASIC METABOLIC PANEL
BUN/Creatinine Ratio: 16 (ref 10–24)
BUN: 14 mg/dL (ref 8–27)
CO2: 20 mmol/L (ref 20–29)
Calcium: 9.5 mg/dL (ref 8.6–10.2)
Chloride: 99 mmol/L (ref 96–106)
Creatinine, Ser: 0.9 mg/dL (ref 0.76–1.27)
Glucose: 288 mg/dL — ABNORMAL HIGH (ref 65–99)
Potassium: 4.3 mmol/L (ref 3.5–5.2)
Sodium: 137 mmol/L (ref 134–144)
eGFR: 97 mL/min/{1.73_m2} (ref >59)

## 2020-08-20 NOTE — Progress Notes (Signed)
His TSH is slightly low, but his thyroid hormone levels are OK. His blood sugar, however, is elevated. I am going to refer him to endocrinology for both of these issues. Is he still taking janumet and glipizide?

## 2020-08-20 NOTE — Progress Notes (Signed)
Thank you. I updated his med list. Did he have any affordability issues with janumet? I would like to start him on a medication prior to him seeing Endocrinology, but I want to make sure I'm sending in something that is too expensive.

## 2020-08-21 ENCOUNTER — Telehealth: Payer: Self-pay

## 2020-08-21 NOTE — Telephone Encounter (Signed)
Patient returning call. Call back # [phone]

## 2020-08-22 ENCOUNTER — Other Ambulatory Visit: Payer: Self-pay | Admitting: Nurse Practitioner

## 2020-08-22 DIAGNOSIS — G894 Chronic pain syndrome: Secondary | ICD-10-CM | POA: Diagnosis not present

## 2020-08-22 DIAGNOSIS — M5441 Lumbago with sciatica, right side: Secondary | ICD-10-CM | POA: Diagnosis not present

## 2020-08-22 DIAGNOSIS — M5416 Radiculopathy, lumbar region: Secondary | ICD-10-CM | POA: Diagnosis not present

## 2020-08-22 DIAGNOSIS — G8929 Other chronic pain: Secondary | ICD-10-CM | POA: Diagnosis not present

## 2020-08-22 DIAGNOSIS — Z9689 Presence of other specified functional implants: Secondary | ICD-10-CM | POA: Diagnosis not present

## 2020-08-22 DIAGNOSIS — M961 Postlaminectomy syndrome, not elsewhere classified: Secondary | ICD-10-CM | POA: Diagnosis not present

## 2020-08-22 DIAGNOSIS — Z5181 Encounter for therapeutic drug level monitoring: Secondary | ICD-10-CM | POA: Diagnosis not present

## 2020-08-22 DIAGNOSIS — Z79899 Other long term (current) drug therapy: Secondary | ICD-10-CM | POA: Diagnosis not present

## 2020-08-22 MED ORDER — TIRZEPATIDE 5 MG/0.5ML ~~LOC~~ SOAJ: 5.0000 mg | SUBCUTANEOUS | 1 refills | Status: DC

## 2020-08-22 MED ORDER — TIRZEPATIDE 2.5 MG/0.5ML ~~LOC~~ SOAJ: 2.5000 mg | SUBCUTANEOUS | 0 refills | Status: DC

## 2020-08-22 NOTE — Progress Notes (Signed)
I sent in the new Mounjaro medication. It is a weekly injectable that is great at lowering A1c. If there are any issues, give me a call.

## 2020-08-27 ENCOUNTER — Telehealth: Payer: Self-pay

## 2020-08-27 ENCOUNTER — Other Ambulatory Visit: Payer: Self-pay | Admitting: *Deleted

## 2020-08-27 ENCOUNTER — Other Ambulatory Visit: Payer: Self-pay | Admitting: Internal Medicine

## 2020-08-27 MED ORDER — METFORMIN HCL 1000 MG PO TABS: 1000.0000 mg | ORAL_TABLET | Freq: Two times a day (BID) | ORAL | 0 refills | Status: DC

## 2020-08-27 NOTE — Telephone Encounter (Signed)
Patient returning call about lab results.  [phone]

## 2020-09-09 ENCOUNTER — Other Ambulatory Visit: Payer: Self-pay

## 2020-09-09 DIAGNOSIS — I152 Hypertension secondary to endocrine disorders: Secondary | ICD-10-CM

## 2020-09-09 DIAGNOSIS — E785 Hyperlipidemia, unspecified: Secondary | ICD-10-CM

## 2020-09-09 DIAGNOSIS — E1159 Type 2 diabetes mellitus with other circulatory complications: Secondary | ICD-10-CM

## 2020-09-09 MED ORDER — ATORVASTATIN CALCIUM 10 MG PO TABS: ORAL_TABLET | ORAL | 0 refills | Status: DC

## 2020-09-09 MED ORDER — BENAZEPRIL HCL 40 MG PO TABS: ORAL_TABLET | ORAL | 0 refills | Status: DC

## 2020-09-10 ENCOUNTER — Ambulatory Visit (INDEPENDENT_AMBULATORY_CARE_PROVIDER_SITE_OTHER): Payer: Medicare HMO | Admitting: Internal Medicine

## 2020-09-10 ENCOUNTER — Encounter: Payer: Self-pay | Admitting: Internal Medicine

## 2020-09-10 ENCOUNTER — Other Ambulatory Visit: Payer: Self-pay

## 2020-09-10 VITALS — BP 140/72 | HR 80 | Temp 98.2°F | Resp 18 | Ht 70.5 in | Wt 259.0 lb

## 2020-09-10 DIAGNOSIS — E049 Nontoxic goiter, unspecified: Secondary | ICD-10-CM

## 2020-09-10 DIAGNOSIS — Z23 Encounter for immunization: Secondary | ICD-10-CM | POA: Diagnosis not present

## 2020-09-10 DIAGNOSIS — E1165 Type 2 diabetes mellitus with hyperglycemia: Secondary | ICD-10-CM | POA: Diagnosis not present

## 2020-09-10 DIAGNOSIS — E1159 Type 2 diabetes mellitus with other circulatory complications: Secondary | ICD-10-CM | POA: Diagnosis not present

## 2020-09-10 DIAGNOSIS — I152 Hypertension secondary to endocrine disorders: Secondary | ICD-10-CM | POA: Diagnosis not present

## 2020-09-10 DIAGNOSIS — Z1211 Encounter for screening for malignant neoplasm of colon: Secondary | ICD-10-CM | POA: Diagnosis not present

## 2020-09-10 NOTE — Patient Instructions (Signed)
Please continue taking medications as prescribed.  Please continue to follow low carb and low salt diet and perform moderate exercise/walking at least 150 mins/week.  You are being referred to GI for screening colonoscopy.

## 2020-09-10 NOTE — Progress Notes (Signed)
Established Patient Office Visit  Subjective:  Patient ID: Bryan Wright, male    DOB: Mar 20, 1955  Age: 65 y.o. MRN: [id number]  CC:  Chief Complaint  Patient presents with   Hypertension    HPI Bryan Wright is a 65 year old male with past medical history of hypertension, type 2 diabetes mellitus, goiter and chronic pain syndrome 2/2 to lumbar spondylosis who presents for follow up of his chronic medical conditions.  BP is well-controlled. Takes medications regularly. Patient denies headache, dizziness, chest pain, dyspnea or palpitations.  His HbA1c was 8.9 in 11/2019.  He is on metformin and glipizide currently.  He was recently placed on Mounjaro, but he has not started taking medication yet. Denies any polyuria, polyphagia or fatigue currently.  He was recently found to have goiter and has low TSH. Denies any tremors, recent weight loss or change in appetite.  He received PCV 20 in the office today.  Past Medical History:  Diagnosis Date   Diabetes mellitus dx in 2008   Hypertension 2008    Past Surgical History:  Procedure Laterality Date   APPENDECTOMY     early 20's   [place] SURGERY  2002   cervical  2002   SPINE SURGERY  2009, 2010   lumbar    History reviewed. No pertinent family history.  Social History   Socioeconomic History   Marital status: Married    Spouse name: Vinnie Level    Number of children: 1   Years of education: Not on file   Highest education level: 8th grade  Occupational History   Not on file  Tobacco Use   Smoking status: Former    Packs/day: 1.50    Years: 5.00    Pack years: 7.50    Types: Cigarettes    Quit date: 1983    Years since quitting: 39.6   Smokeless tobacco: Former    Types: Chew    Quit date: 06/18/2015  Vaping Use   Vaping Use: Never used  Substance and Sexual Activity   Alcohol use: No    Alcohol/week: 0.0 standard drinks   Drug use: No   Sexual activity:  Not Currently  Other Topics Concern   Not on file  Social History Narrative   Lives with wife (in hospital currently rehab)      Two dogs: Precious and [place] of rabbits      Son and two grandkids live behind you       Enjoys outside, visit friends,      Diet: not good, eats meats, fast food since wife has been sick   Caffeine: diet soda-usually caffeine free, tea    Water: 2-3 bottles daily       Wears seat belt   Smoke detectors at home   Does not use phone while driving    Social Determinants of Health   Financial Resource Strain: Low Risk    Difficulty of Paying Living Expenses: Not very hard  Food Insecurity: No Food Insecurity   Worried About Charity fundraiser in the Last Year: Never true   [place] in the Last Year: Never true  Transportation Needs: No Transportation Needs   Lack of Transportation (Medical): No   Lack of Transportation (Non-Medical): No  Physical Activity: Insufficiently Active   Days of Exercise per Week: 2 days   Minutes of Exercise per Session: 30 min  Stress: No Stress Concern Present   Feeling of Stress : Not at all  Social Connections: Moderately Integrated   Frequency of Communication with Friends and Family: Twice a week   Frequency of Social Gatherings with Friends and Family: Twice a week   Attends Religious Services: 1 to 4 times per year   Active Member of [employer] or Organizations: No   Attends Music therapist: Never   Marital Status: Married  Human resources officer Violence: Not At Risk   Fear of Current or Ex-Partner: No   Emotionally Abused: No   Physically Abused: No   Sexually Abused: No    Outpatient Medications Prior to Visit  Medication Sig Dispense Refill   atorvastatin (LIPITOR) 10 MG tablet TAKE 1 TABLET(10 MG) BY MOUTH DAILY 90 tablet 0   benazepril (LOTENSIN) 40 MG tablet TAKE 1 TABLET(40 MG) BY MOUTH DAILY 90 tablet 0   cyclobenzaprine (FLEXERIL) 10 MG tablet Take 1 tablet (10 mg total) by  mouth 2 (two) times daily as needed for muscle spasms. 20 tablet 0   fentaNYL (DURAGESIC - DOSED MCG/HR) 50 MCG/HR Place 50 mcg onto the skin every 3 (three) days.     glipiZIDE (GLIPIZIDE XL) 10 MG 24 hr tablet Take 1 tablet (10 mg total) by mouth daily with breakfast. 90 tablet 1   HYDROcodone-Acetaminophen 10-300 MG TABS to 2 tablets every 6 hours as needed Gets 365b tabs per 3 months, from pain clinic in [place] Nerve stimulator implanted in 2012 for chronic back pain with failed back surgery x 2 365 each 0   metFORMIN (GLUCOPHAGE) 1000 MG tablet TAKE 1 TABLET(1000 MG) BY MOUTH TWICE DAILY WITH A MEAL 180 tablet 0   Multiple Vitamin (THERA) TABS Take 2 tablets by mouth daily.     sildenafil (REVATIO) 20 MG tablet DISSOLVE ONE TROCHE UNDERNEATH THE TONGUE 60 MINUTES PRIOR TO SEXUAL ENCOUNTER. 10 tablet 0   tirzepatide (MOUNJARO) 2.5 MG/0.5ML Pen Inject 2.5 mg into the skin once a week. 2 mL 0   [START ON 09/19/2020] tirzepatide (MOUNJARO) 5 MG/0.5ML Pen Inject 5 mg into the skin once a week. 6 mL 1   UNABLE TO FIND One touch meter and lancets Once daily testing dx e11.9 50 each 5   No facility-administered medications prior to visit.    No Known Allergies  ROS Review of Systems  Constitutional:  Negative for chills and fever.  HENT:  Negative for congestion and sore throat.   Eyes:  Negative for pain and discharge.  Respiratory:  Negative for cough and shortness of breath.   Cardiovascular:  Negative for chest pain and palpitations.  Gastrointestinal:  Negative for constipation, diarrhea, nausea and vomiting.  Endocrine: Negative for polydipsia and polyuria.  Genitourinary:  Negative for dysuria and hematuria.  Musculoskeletal:  Positive for arthralgias and back pain. Negative for neck pain and neck stiffness.  Skin:  Negative for rash.  Neurological:  Negative for dizziness, weakness, numbness and headaches.  Psychiatric/Behavioral:  Negative for agitation and behavioral problems.       Objective:    Physical Exam Vitals reviewed.  Constitutional:      General: He is not in acute distress.    Appearance: He is obese. He is not diaphoretic.  HENT:     Head: Normocephalic and atraumatic.     Nose: Nose normal.     Mouth/Throat:     Mouth: Mucous membranes are moist.  Eyes:     General: No scleral icterus.  Extraocular Movements: Extraocular movements intact.  Cardiovascular:     Rate and Rhythm: Normal rate and regular rhythm.     Pulses: Normal pulses.     Heart sounds: Normal heart sounds. No murmur heard. Pulmonary:     Breath sounds: Normal breath sounds. No wheezing or rales.  Abdominal:     Palpations: Abdomen is soft.     Tenderness: There is no abdominal tenderness.  Musculoskeletal:     Cervical back: Neck supple. No tenderness.     Right lower leg: No edema.     Left lower leg: No edema.  Skin:    General: Skin is warm.     Findings: No rash.  Neurological:     General: No focal deficit present.     Mental Status: He is alert and oriented to person, place, and time.     Cranial Nerves: No cranial nerve deficit.     Sensory: No sensory deficit.  Psychiatric:        Mood and Affect: Mood normal.        Behavior: Behavior normal.    BP 140/72 (BP Location: Left Arm, Cuff Size: Normal)   Pulse 80   Temp 98.2 F (36.8 C)   Resp 18   Ht 5' 10.5" (1.791 m)   Wt 259 lb (117.5 kg)   SpO2 92%   BMI 36.64 kg/m  Wt Readings from Last 3 Encounters:  09/10/20 259 lb (117.5 kg)  08/16/20 259 lb (117.5 kg)  07/21/20 260 lb (117.9 kg)     Health Maintenance Due  Topic Date Due   COVID-19 Vaccine (1) Never done   Zoster Vaccines- Shingrix (1 of 2) Never done   COLONOSCOPY (Pts 45-31yr Insurance coverage will need to be confirmed)  Never done   Pneumococcal Vaccine 0262Years old (3 - PCV) 03/19/2016   FOOT EXAM  06/14/2017   HEMOGLOBIN A1C  06/03/2020   INFLUENZA VACCINE  08/19/2020    There are no preventive care reminders to  display for this patient.  Lab Results  Component Value Date   TSH 0.414 (L) 08/16/2020   Lab Results  Component Value Date   WBC 9.8 08/16/2020   HGB 14.5 08/16/2020   HCT 42.6 08/16/2020   MCV 90 08/16/2020   PLT 205 08/16/2020   Lab Results  Component Value Date   NA 137 08/16/2020   K 4.3 08/16/2020   CO2 20 08/16/2020   GLUCOSE 288 (H) 08/16/2020   BUN 14 08/16/2020   CREATININE 0.90 08/16/2020   BILITOT 0.5 12/24/2015   ALKPHOS 54 12/24/2015   AST 33 12/24/2015   ALT 28 12/24/2015   PROT 7.1 12/24/2015   ALBUMIN 3.8 12/24/2015   CALCIUM 9.5 08/16/2020   ANIONGAP 7 07/21/2020   EGFR 97 08/16/2020   Lab Results  Component Value Date   CHOL 149 12/24/2015   Lab Results  Component Value Date   HDL 33 (L) 12/24/2015   Lab Results  Component Value Date   LDLCALC 97 12/24/2015   Lab Results  Component Value Date   TRIG 94 12/24/2015   Lab Results  Component Value Date   CHOLHDL 4.5 12/24/2015   Lab Results  Component Value Date   HGBA1C 8.9 (A) 12/05/2019      Assessment & Plan:   Problem List Items Addressed This Visit       Cardiovascular and Mediastinum   Hy[place]ension associated with diabetes ([place]    BP Readings from Last  1 Encounters:  09/10/20 140/72  Well-controlled Counseled for compliance with the medications Advised DASH diet and moderate exercise/walking, at least 150 mins/week         Endocrine   Type 2 diabetes mellitus with hyperglycemia, without long-term current use of insulin (HCC)    Lab Results  Component Value Date   HGBA1C 8.9 (A) 12/05/2019  On Metformin and Glipizide Was recently started on Mounjaro, but has not picked it up from Pharmacy yet Advised to follow diabetic diet On statin and ACEi F/u CMP and lipid panel Diabetic eye exam: Advised to follow up with Ophthalmology for diabetic eye exam       Goiter    Incidentally found on CT chest Lab Results  Component Value Date   TSH 0.414 (L)  08/16/2020   Has an appointment with Endocrinology      Other Visit Diagnoses     Screening for colon cancer       Relevant Orders   Ambulatory referral to Gastroenterology   Immunization due       Relevant Orders   Pneumococcal conjugate vaccine 20-valent (Prevnar 20) (Completed)       No orders of the defined types were placed in this encounter.   Follow-up: Return in about 3 months (around 12/11/2020) for Annual physical.    Lindell Spar, MD

## 2020-09-11 ENCOUNTER — Encounter: Payer: Self-pay | Admitting: Internal Medicine

## 2020-09-14 NOTE — Assessment & Plan Note (Signed)
BP Readings from Last 1 Encounters:  09/10/20 140/72   Well-controlled Counseled for compliance with the medications Advised DASH diet and moderate exercise/walking, at least 150 mins/week

## 2020-09-14 NOTE — Assessment & Plan Note (Signed)
Lab Results  Component Value Date   HGBA1C 8.9 (A) 12/05/2019   On Metformin and Glipizide Was recently started on Mounjaro, but has not picked it up from Pharmacy yet Advised to follow diabetic diet On statin and ACEi F/u CMP and lipid panel Diabetic eye exam: Advised to follow up with Ophthalmology for diabetic eye exam

## 2020-09-14 NOTE — Assessment & Plan Note (Signed)
Incidentally found on CT chest Lab Results  Component Value Date   TSH 0.414 (L) 08/16/2020   Has an appointment with Endocrinology

## 2020-09-26 ENCOUNTER — Ambulatory Visit: Payer: Medicare HMO | Admitting: "Endocrinology

## 2020-09-26 ENCOUNTER — Other Ambulatory Visit: Payer: Self-pay

## 2020-09-26 ENCOUNTER — Encounter: Payer: Self-pay | Admitting: "Endocrinology

## 2020-09-26 VITALS — BP 136/64 | HR 80 | Ht 70.5 in | Wt 259.8 lb

## 2020-09-26 DIAGNOSIS — E0590 Thyrotoxicosis, unspecified without thyrotoxic crisis or storm: Secondary | ICD-10-CM | POA: Diagnosis not present

## 2020-09-26 DIAGNOSIS — E669 Obesity, unspecified: Secondary | ICD-10-CM | POA: Diagnosis not present

## 2020-09-26 DIAGNOSIS — I10 Essential (primary) hypertension: Secondary | ICD-10-CM | POA: Diagnosis not present

## 2020-09-26 DIAGNOSIS — E1165 Type 2 diabetes mellitus with hyperglycemia: Secondary | ICD-10-CM

## 2020-09-26 DIAGNOSIS — E119 Type 2 diabetes mellitus without complications: Secondary | ICD-10-CM

## 2020-09-26 DIAGNOSIS — Z6836 Body mass index (BMI) 36.0-36.9, adult: Secondary | ICD-10-CM

## 2020-09-26 DIAGNOSIS — E782 Mixed hyperlipidemia: Secondary | ICD-10-CM | POA: Diagnosis not present

## 2020-09-26 DIAGNOSIS — Z7984 Long term (current) use of oral hypoglycemic drugs: Secondary | ICD-10-CM | POA: Diagnosis not present

## 2020-09-26 LAB — POCT GLYCOSYLATED HEMOGLOBIN (HGB A1C): HbA1c, POC (controlled diabetic range): 8.9 % — AB (ref 0.0–7.0)

## 2020-09-26 MED ORDER — GLIPIZIDE ER 10 MG PO TB24: 10.0000 mg | ORAL_TABLET | Freq: Every day | ORAL | 1 refills | Status: DC

## 2020-09-26 NOTE — Progress Notes (Signed)
Endocrinology Consult Note       09/26/2020, 3:07 PM   Subjective:    Patient ID: Bryan Wright, male    DOB: 01-May-1955.  Bryan Wright is being seen in consultation for management of currently uncontrolled symptomatic diabetes requested by  Anabel Halon, MD.   Past Medical History:  Diagnosis Date   Diabetes mellitus dx in 2008   Hyperlipidemia    Hypertension 2008    Past Surgical History:  Procedure Laterality Date   APPENDECTOMY     early 20's   CERVICAL DISC SURGERY     LUNG SURGERY     SPINE SURGERY  2002   cervical  2002   SPINE SURGERY  2009, 2010   lumbar    Social History   Socioeconomic History   Marital status: Married    Spouse name: Rosalita Chessman    Number of children: 1   Years of education: Not on file   Highest education level: 8th grade  Occupational History   Not on file  Tobacco Use   Smoking status: Former    Packs/day: 1.50    Years: 5.00    Pack years: 7.50    Types: Cigarettes    Quit date: 1983    Years since quitting: 39.7   Smokeless tobacco: Former    Types: Chew    Quit date: 06/18/2015  Vaping Use   Vaping Use: Never used  Substance and Sexual Activity   Alcohol use: No    Alcohol/week: 0.0 standard drinks   Drug use: No   Sexual activity: Not Currently  Other Topics Concern   Not on file  Social History Narrative   Lives with wife (in hospital currently rehab)      Two dogs: Precious and [place]    Lots of rabbits      Son and two grandkids live behind you       Enjoys outside, visit friends,      Diet: not good, eats meats, fast food since wife has been sick   Caffeine: diet soda-usually caffeine free, tea    Water: 2-3 bottles daily       Wears seat belt   Smoke detectors at home   Does not use phone while driving    Social Determinants of Health   Financial Resource Strain: Low Risk    Difficulty of Paying Living  Expenses: Not very hard  Food Insecurity: No Food Insecurity   Worried About Programme researcher, broadcasting/film/video in the Last Year: Never true   Ran Out of Food in the Last Year: Never true  Transportation Needs: No Transportation Needs   Lack of Transportation (Medical): No   Lack of Transportation (Non-Medical): No  Physical Activity: Insufficiently Active   Days of Exercise per Week: 2 days   Minutes of Exercise per Session: 30 min  Stress: No Stress Concern Present   Feeling of Stress : Not at all  Social Connections: Moderately Integrated   Frequency of Communication with Friends and Family: Twice a week   Frequency of Social Gatherings with Friends and Family: Twice a week   Attends Religious  Services: 1 to 4 times per year   Active Member of Clubs or Organizations: No   Attends Banker Meetings: Never   Marital Status: Married    History reviewed. No pertinent family history.  Outpatient Encounter Medications as of 09/26/2020  Medication Sig   atorvastatin (LIPITOR) 10 MG tablet TAKE 1 TABLET(10 MG) BY MOUTH DAILY   benazepril (LOTENSIN) 40 MG tablet TAKE 1 TABLET(40 MG) BY MOUTH DAILY   cyclobenzaprine (FLEXERIL) 10 MG tablet Take 1 tablet (10 mg total) by mouth 2 (two) times daily as needed for muscle spasms. (Patient not taking: Reported on 09/26/2020)   fentaNYL (DURAGESIC - DOSED MCG/HR) 50 MCG/HR Place 50 mcg onto the skin every 3 (three) days.   glipiZIDE (GLIPIZIDE XL) 10 MG 24 hr tablet Take 1 tablet (10 mg total) by mouth daily with breakfast.   HYDROcodone-Acetaminophen 10-300 MG TABS to 2 tablets every 6 hours as needed Gets 365b tabs per 3 months, from pain clinic in [place] Nerve stimulator implanted in 2012 for chronic back pain with failed back surgery x 2   metFORMIN (GLUCOPHAGE) 1000 MG tablet TAKE 1 TABLET(1000 MG) BY MOUTH TWICE DAILY WITH A MEAL   Multiple Vitamin (THERA) TABS Take 2 tablets by mouth daily. (Patient not taking: Reported on 09/26/2020)   sildenafil  (REVATIO) 20 MG tablet DISSOLVE ONE TROCHE UNDERNEATH THE TONGUE 60 MINUTES PRIOR TO SEXUAL ENCOUNTER.   UNABLE TO FIND One touch meter and lancets Once daily testing dx e11.9   [DISCONTINUED] glipiZIDE (GLIPIZIDE XL) 10 MG 24 hr tablet Take 1 tablet (10 mg total) by mouth daily with breakfast.   [DISCONTINUED] tirzepatide (MOUNJARO) 2.5 MG/0.5ML Pen Inject 2.5 mg into the skin once a week. (Patient not taking: Reported on 09/26/2020)   [DISCONTINUED] tirzepatide [facility]) 5 MG/0.5ML Pen Inject 5 mg into the skin once a week. (Patient not taking: Reported on 09/26/2020)   No facility-administered encounter medications on file as of 09/26/2020.    ALLERGIES: No Known Allergies  VACCINATION STATUS: Immunization History  Administered Date(s) Administered   Influenza Whole 12/02/2009   Influenza, Seasonal, Injecte, Preservative Fre 09/18/2013, 03/20/2015   Influenza,inj,Quad PF,6+ Mos 09/18/2013, 03/20/2015, 11/25/2016, 10/19/2017, 10/13/2018, 11/21/2019   PNEUMOCOCCAL CONJUGATE-20 09/10/2020   Pneumococcal Polysaccharide-23 12/02/2009, 03/20/2015    Diabetes He presents for his initial diabetic visit. He has type 2 diabetes mellitus. Onset time: He was diagnosed at approximate age of 52 years. There are no hypoglycemic associated symptoms. Pertinent negatives for hypoglycemia include no confusion, headaches, pallor or seizures. Associated symptoms include polydipsia and polyuria. Pertinent negatives for diabetes include no chest pain, no fatigue, no polyphagia and no weakness. There are no hypoglycemic complications. Symptoms are worsening. There are no diabetic complications. Risk factors for coronary artery disease include diabetes mellitus, dyslipidemia, family history, hypertension, male sex, obesity and sedentary lifestyle. Current diabetic treatments: He is currently on metformin 1000 mg p.o. twice daily, glipizide 10 mg p.o. daily at breakfast. His weight is fluctuating minimally. He is  following a generally unhealthy diet. When asked about meal planning, he reported none. He has not had a previous visit with a dietitian. He rarely participates in exercise. (He did not bring any logs nor meter with him.  His point-of-care A1c is 8.9%.) An ACE inhibitor/angiotensin II receptor blocker is being taken.  Hyperlipidemia This is a chronic problem. The current episode started more than 1 year ago. The problem is controlled. Pertinent negatives include no chest pain, myalgias or shortness of breath. Current antihyperlipidemic  treatment includes statins. Risk factors for coronary artery disease include diabetes mellitus, dyslipidemia, family history, hypertension, male sex, a sedentary lifestyle and obesity.    Review of Systems  Constitutional:  Negative for chills, fatigue, fever and unexpected weight change.  HENT:  Negative for dental problem, mouth sores and trouble swallowing.   Eyes:  Negative for visual disturbance.  Respiratory:  Negative for cough, choking, chest tightness, shortness of breath and wheezing.   Cardiovascular:  Negative for chest pain, palpitations and leg swelling.  Gastrointestinal:  Negative for abdominal distention, abdominal pain, constipation, diarrhea, nausea and vomiting.  Endocrine: Positive for polydipsia and polyuria. Negative for polyphagia.  Genitourinary:  Negative for dysuria, flank pain, hematuria and urgency.  Musculoskeletal:  Negative for back pain, gait problem, myalgias and neck pain.  Skin:  Negative for pallor, rash and wound.  Neurological:  Negative for seizures, syncope, weakness, numbness and headaches.  Psychiatric/Behavioral:  Negative for confusion and dysphoric mood.    Objective:    Vitals with BMI 09/26/2020 09/10/2020 09/10/2020  Height 5' 10.5" - 5' 10.5"  Weight 259 lbs 13 oz - 259 lbs  BMI 36.74 - 36.63  Systolic 136 140 161  Diastolic 64 72 72  Pulse 80 - 80    BP 136/64   Pulse 80   Ht 5' 10.5" (1.791 m)   Wt 259 lb  12.8 oz (117.8 kg)   BMI 36.75 kg/m   Wt Readings from Last 3 Encounters:  09/26/20 259 lb 12.8 oz (117.8 kg)  09/10/20 259 lb (117.5 kg)  08/16/20 259 lb (117.5 kg)     Physical Exam Constitutional:      General: He is not in acute distress.    Appearance: He is well-developed.  HENT:     Head: Normocephalic and atraumatic.  Neck:     Thyroid: No thyromegaly.     Trachea: No tracheal deviation.  Cardiovascular:     Rate and Rhythm: Normal rate.     Pulses:          Dorsalis pedis pulses are 1+ on the right side and 1+ on the left side.       Posterior tibial pulses are 1+ on the right side and 1+ on the left side.     Heart sounds: S1 normal and S2 normal. No murmur heard.   No gallop.  Pulmonary:     Effort: Pulmonary effort is normal. No respiratory distress.     Breath sounds: No wheezing.  Abdominal:     General: Bowel sounds are normal. There is no distension.     Tenderness: There is no abdominal tenderness. There is no guarding.  Musculoskeletal:     Right shoulder: No swelling or deformity.     Cervical back: Normal range of motion and neck supple.  Skin:    General: Skin is warm and dry.     Findings: No rash.     Nails: There is no clubbing.  Neurological:     Mental Status: He is alert and oriented to person, place, and time.     Cranial Nerves: No cranial nerve deficit.     Sensory: No sensory deficit.     Gait: Gait normal.     Deep Tendon Reflexes: Reflexes are normal and symmetric.  Psychiatric:        Speech: Speech normal.        Behavior: Behavior normal. Behavior is cooperative.        Thought Content: Thought content normal.  Judgment: Judgment normal.      CMP ( most recent) CMP     Component Value Date/Time   NA 137 08/16/2020 1539   K 4.3 08/16/2020 1539   CL 99 08/16/2020 1539   CO2 20 08/16/2020 1539   GLUCOSE 288 (H) 08/16/2020 1539   GLUCOSE 269 (H) 07/21/2020 1901   BUN 14 08/16/2020 1539   CREATININE 0.90 08/16/2020  1539   CREATININE 0.82 06/11/2016 1448   CALCIUM 9.5 08/16/2020 1539   PROT 7.1 12/24/2015 1525   ALBUMIN 3.8 12/24/2015 1525   AST 33 12/24/2015 1525   ALT 28 12/24/2015 1525   ALKPHOS 54 12/24/2015 1525   BILITOT 0.5 12/24/2015 1525   GFRNONAA >60 07/21/2020 1752   GFRNONAA >89 06/11/2016 1448   GFRAA >89 06/11/2016 1448     Diabetic Labs (most recent): Lab Results  Component Value Date   HGBA1C 8.9 (A) 09/26/2020   HGBA1C 8.9 (A) 12/05/2019   HGBA1C 8.3 (H) 06/11/2016     Lipid Panel ( most recent) Lipid Panel     Component Value Date/Time   CHOL 149 12/24/2015 1525   TRIG 94 12/24/2015 1525   HDL 33 (L) 12/24/2015 1525   CHOLHDL 4.5 12/24/2015 1525   VLDL 19 12/24/2015 1525   LDLCALC 97 12/24/2015 1525      Lab Results  Component Value Date   TSH 0.414 (L) 08/16/2020   TSH 1.17 06/11/2016   TSH 0.72 03/20/2015   TSH 1.234 02/28/2014   TSH 0.634 08/05/2012   FREET4 1.28 08/16/2020           Assessment & Plan:   1. Type 2 diabetes mellitus with hyperglycemia, without long-term current use of insulin (HCC)   - Bryan Wright has currently uncontrolled symptomatic type 2 DM since  65 years of age,  with most recent A1c of 8.9 %. Recent labs reviewed. - I had a long discussion with him about the progressive nature of diabetes and the pathology behind its complications. -his diabetes is complicated by obesity/sedentary life and he remains at a high risk for more acute and chronic complications which include CAD, CVA, CKD, retinopathy, and neuropathy. These are all discussed in detail with him.  - I have counseled him on diet  and weight management  by adopting a carbohydrate restricted/protein rich diet. Patient is encouraged to switch to  unprocessed or minimally processed     complex starch and increased protein intake (animal or plant source), fruits, and vegetables. -  he is advised to stick to a routine mealtimes to eat 3 meals  a day and avoid  unnecessary snacks ( to snack only to correct hypoglycemia).   - he acknowledges that there is a room for improvement in his food and drink choices. - Suggestion is made for him to avoid simple carbohydrates  from his diet including Cakes, Sweet Desserts, Ice Cream, Soda (diet and regular), Sweet Tea, Candies, Chips, Cookies, Store Bought Juices, Alcohol in Excess of  1-2 drinks a day, Artificial Sweeteners,  Coffee Creamer, and "Sugar-free" Products. This will help patient to have more stable blood glucose profile and potentially avoid unintended weight gain.  - he will be scheduled with Norm Salt, RDN, CDE for diabetes education.  - I have approached him with the following individualized plan to manage  his diabetes and patient agrees:   - he is willing to make major changes in his lifestyle.  He will be continued on his current regimen including metformin  1000 mg p.o. twice daily, glipizide 10 mg XL p.o. daily at breakfast.   -He is willing and advised to continue monitoring blood glucose twice a day-daily before breakfast and at bedtime.   - he is encouraged to call clinic for blood glucose levels less than 70 or above 200 mg /dl, at fasting.  - he will be considered for incretin therapy or basal insulin as appropriate next visit.  - Specific targets for  A1c;  LDL, HDL,  and Triglycerides were discussed with the patient.  2) Blood Pressure /Hypertension:  his blood pressure is  controlled to target.   he is advised to continue his current medications including benazepril 40 mg p.o. daily with breakfast . 3) Lipids/Hyperlipidemia:   Review of his recent lipid panel showed  uncontrolled  LDL at 97 .  he  is advised to continue    atorvastatin 10 mg daily at bedtime.  Side effects and precautions discussed with him.  4)  Weight/Diet:  Body mass index is 36.75 kg/m.  -   clearly complicating his diabetes care.   he is  a candidate for weight loss. I discussed with him the fact that loss of  5 - 10% of his  current body weight will have the most impact on his diabetes management.  Exercise, and detailed carbohydrates information provided  -  detailed on discharge instructions.  5) Chronic Care/Health Maintenance:  -he  is on ACEI/ARB and Statin medications and  is encouraged to initiate and continue to follow up with Ophthalmology, Dentist,  Podiatrist at least yearly or according to recommendations, and advised to   stay away from smoking. I have recommended yearly flu vaccine and pneumonia vaccine at least every 5 years; moderate intensity exercise for up to 150 minutes weekly; and  sleep for at least 7 hours a day.  6) subclinical hyperthyroidism-TSH slightly suppressed at 0.41.  His thyroid hormones are normal.  He will not need any antithyroid intervention at this time.  He will need repeat full profile thyroid function test on subsequent visits.  No clinical goiter, no need for thyroid imaging for now.    - he is  advised to maintain close follow up with Anabel HalonPatel, Rutwik K, MD for primary care needs, as well as his other providers for optimal and coordinated care.   I spent 60 minutes in the care of the patient today including review of labs from CMP, Lipids, Thyroid Function, Hematology (current and previous including abstractions from other facilities); face-to-face time discussing  his blood glucose readings/logs, discussing hypoglycemia and hyperglycemia episodes and symptoms, medications doses, his options of short and long term treatment based on the latest standards of care / guidelines;  discussion about incorporating lifestyle medicine;  and documenting the encounter.     Please refer to Patient Instructions for Blood Glucose Monitoring and Insulin/Medications Dosing Guide"  in media tab for additional information. Please  also refer to " Patient Self Inventory" in the Media  tab for reviewed elements of pertinent patient history.  Bryan Wright participated in the  discussions, expressed understanding, and voiced agreement with the above plans.  All questions were answered to his satisfaction. he is encouraged to contact clinic should he have any questions or concerns prior to his return visit.   Follow up plan: - Return in about 3 months (around 12/26/2020) for Bring Meter and Logs- A1c in Office.  Bryan LunchGebre Fabian Walder, MD [facility] [address] [place] [address] Phone: [phone]  Fax: [phone]    09/26/2020, 3:07 PM  This note was partially dictated with voice recognition software. Similar sounding words can be transcribed inadequately or may not  be corrected upon review.

## 2020-09-26 NOTE — Patient Instructions (Signed)

## 2020-09-27 ENCOUNTER — Other Ambulatory Visit: Payer: Self-pay

## 2020-09-27 ENCOUNTER — Telehealth: Payer: Self-pay

## 2020-09-27 MED ORDER — ONETOUCH VERIO VI STRP: ORAL_STRIP | 1 refills | Status: DC

## 2020-09-27 MED ORDER — ONETOUCH VERIO FLEX SYSTEM W/DEVICE KIT: PACK | 1 refills | Status: DC

## 2020-09-27 MED ORDER — ONETOUCH DELICA LANCETS 33G MISC: 1 refills | Status: DC

## 2020-09-27 NOTE — Telephone Encounter (Signed)
Patient said he does not have a meter. He would like one called in with supplies. He thinks the last one he had was a One Touch. Please send to [facility] on [address]

## 2020-09-27 NOTE — Telephone Encounter (Signed)
I have sent the prescription in for the meter kit, strips, and lancets to the requested pharmacy.

## 2020-10-17 DIAGNOSIS — M961 Postlaminectomy syndrome, not elsewhere classified: Secondary | ICD-10-CM | POA: Diagnosis not present

## 2020-10-17 DIAGNOSIS — M5416 Radiculopathy, lumbar region: Secondary | ICD-10-CM | POA: Diagnosis not present

## 2020-10-17 DIAGNOSIS — G8929 Other chronic pain: Secondary | ICD-10-CM | POA: Diagnosis not present

## 2020-10-17 DIAGNOSIS — M5441 Lumbago with sciatica, right side: Secondary | ICD-10-CM | POA: Diagnosis not present

## 2020-10-17 DIAGNOSIS — Z9689 Presence of other specified functional implants: Secondary | ICD-10-CM | POA: Diagnosis not present

## 2020-10-17 DIAGNOSIS — G894 Chronic pain syndrome: Secondary | ICD-10-CM | POA: Diagnosis not present

## 2020-10-31 ENCOUNTER — Ambulatory Visit: Payer: Medicare HMO | Admitting: Nutrition

## 2020-11-26 ENCOUNTER — Ambulatory Visit: Payer: Medicare HMO | Admitting: Nutrition

## 2020-12-01 ENCOUNTER — Other Ambulatory Visit: Payer: Self-pay | Admitting: Internal Medicine

## 2020-12-01 DIAGNOSIS — E785 Hyperlipidemia, unspecified: Secondary | ICD-10-CM

## 2020-12-01 DIAGNOSIS — I152 Hypertension secondary to endocrine disorders: Secondary | ICD-10-CM

## 2020-12-09 ENCOUNTER — Encounter (INDEPENDENT_AMBULATORY_CARE_PROVIDER_SITE_OTHER): Payer: Self-pay

## 2020-12-09 ENCOUNTER — Other Ambulatory Visit: Payer: Self-pay

## 2020-12-09 ENCOUNTER — Ambulatory Visit (INDEPENDENT_AMBULATORY_CARE_PROVIDER_SITE_OTHER): Payer: Medicare HMO

## 2020-12-09 DIAGNOSIS — Z0000 Encounter for general adult medical examination without abnormal findings: Secondary | ICD-10-CM | POA: Diagnosis not present

## 2020-12-09 NOTE — Progress Notes (Signed)
Subjective:   Bryan Wright is a 64 y.o. male who presents for Medicare Annual/Subsequent preventive examination. I connected with  AKIF WELDY on 12/09/20 by a audio enabled telemedicine application and verified that I am speaking with the correct person using two identifiers.  Patient Location: Home  Provider Location: Office/Clinic  I discussed the limitations of evaluation and management by telemedicine. The patient expressed understanding and agreed to proceed.  Review of Systems    Defer to PCP       Objective:    There were no vitals filed for this visit. There is no height or weight on file to calculate BMI.  Advanced Directives 07/21/2020 12/06/2019 04/15/2016  Does Patient Have a Medical Advance Directive? No No No  Would patient like information on creating a medical advance directive? No - Patient declined No - Patient declined No - Patient declined    Current Medications (verified) Outpatient Encounter Medications as of 12/09/2020  Medication Sig   Blood Glucose Monitoring Suppl ([place] FLEX SYSTEM) w/Device KIT Use to test blood glucose twice a day-daily before breakfast and at bedtime.   glucose blood (ONETOUCH VERIO) test strip Use as instructed to test blood glucose twice a day-daily before breakfast and at bedtime.   OneTouch Delica Lancets 92J MISC Use to test blood glucose twice a day-daily before breakfast and at bedtime.   atorvastatin (LIPITOR) 10 MG tablet TAKE 1 TABLET(10 MG) BY MOUTH DAILY   benazepril (LOTENSIN) 40 MG tablet TAKE 1 TABLET(40 MG) BY MOUTH DAILY   cyclobenzaprine (FLEXERIL) 10 MG tablet Take 1 tablet (10 mg total) by mouth 2 (two) times daily as needed for muscle spasms. (Patient not taking: Reported on 09/26/2020)   fentaNYL (DURAGESIC - DOSED MCG/HR) 50 MCG/HR Place 50 mcg onto the skin every 3 (three) days.   glipiZIDE (GLIPIZIDE XL) 10 MG 24 hr tablet Take 1 tablet (10 mg total) by mouth daily with breakfast.    HYDROcodone-Acetaminophen 10-300 MG TABS to 2 tablets every 6 hours as needed Gets 365b tabs per 3 months, from pain clinic in [place] Nerve stimulator implanted in 2012 for chronic back pain with failed back surgery x 2   metFORMIN (GLUCOPHAGE) 1000 MG tablet TAKE 1 TABLET(1000 MG) BY MOUTH TWICE DAILY WITH A MEAL   Multiple Vitamin (THERA) TABS Take 2 tablets by mouth daily. (Patient not taking: Reported on 09/26/2020)   sildenafil (REVATIO) 20 MG tablet DISSOLVE ONE TROCHE UNDERNEATH THE TONGUE 60 MINUTES PRIOR TO SEXUAL ENCOUNTER.   UNABLE TO FIND One touch meter and lancets Once daily testing dx e11.9   No facility-administered encounter medications on file as of 12/09/2020.    Allergies (verified) Patient has no known allergies.   History: Past Medical History:  Diagnosis Date   Diabetes mellitus dx in 2008   Hyperlipidemia    Hypertension 2008   Past Surgical History:  Procedure Laterality Date   APPENDECTOMY     early 20's   [place] SURGERY  2002   cervical  2002   SPINE SURGERY  2009, 2010   lumbar   No family history on file. Social History   Socioeconomic History   Marital status: Married    Spouse name: Bryan Wright    Number of children: 1   Years of education: Not on file   Highest education Wright: 8th grade  Occupational History   Not on file  Tobacco Use  Smoking status: Former    Packs/day: 1.50    Years: 5.00    Pack years: 7.50    Types: Cigarettes    Quit date: 66    Years since quitting: 39.9   Smokeless tobacco: Former    Types: Chew    Quit date: 06/18/2015  Vaping Use   Vaping Use: Never used  Substance and Sexual Activity   Alcohol use: No    Alcohol/week: 0.0 standard drinks   Drug use: No   Sexual activity: Not Currently  Other Topics Concern   Not on file  Social History Narrative   Lives with wife (in hospital currently rehab)      Two dogs: Precious and [place]ehner of rabbits       Son and two grandkids live behind you       Enjoys outside, visit friends,      Diet: not good, eats meats, fast food since wife has been sick   Caffeine: diet soda-usually caffeine free, tea    Water: 2-3 bottles daily       Wears seat belt   Smoke detectors at home   Does not use phone while driving    Social Determinants of Health   Financial Resource Strain: Not on file  Food Insecurity: Not on file  Transportation Needs: Not on file  Physical Activity: Not on file  Stress: Not on file  Social Connections: Not on file    Tobacco Counseling Counseling given: Not Answered   Clinical Intake:                 Diabetic?yes Nutrition Risk Assessment:  Has the patient had any N/V/D within the last 2 months?  No  Does the patient have any non-healing wounds?  No  Has the patient had any unintentional weight loss or weight gain?  No   Diabetes:  Is the patient diabetic?  Yes  If diabetic, was a CBG obtained today?  N/A Did the patient bring in their glucometer from home?   N/A How often do you monitor your CBG's? N/A.   Financial Strains and Diabetes Management:  Are you having any financial strains with the device, your supplies or your medication? No .  Does the patient want to be seen by Chronic Care Management for management of their diabetes?  Yes  Would the patient like to be referred to a Nutritionist or for Diabetic Management?   N/a  Diabetic Exams:  Diabetic Eye Exam: Completed 07/16/2020 Diabetic Foot Exam: Overdue, Pt has been advised about the importance in completing this exam. Pt is scheduled for diabetic foot exam on 02/03/2021.          Activities of Daily Living No flowsheet data found.  Patient Care Team: Lindell Spar, MD as PCP - General (Internal Medicine) Rutherford Guys, MD as Consulting Physician (Ophthalmology) Kandace Blitz, MD as Referring Physician (Pain Medicine)  Indicate any recent Medical Services you may  have received from other than [facility] providers in the past year (date may be approximate).     Assessment:   This is a routine wellness examination for [place].  Hearing/Vision screen No results found.  Dietary issues and exercise activities discussed:     Goals Addressed   None   Depression Screen PHQ 2/9 Scores 09/10/2020 08/16/2020 12/06/2019 12/06/2019 12/05/2019 07/04/2019 03/23/2019  PHQ - 2 Score 0 0 0 0 0 0 0    Fall Risk Fall Risk  09/10/2020 08/16/2020 12/06/2019 12/05/2019  07/04/2019  Falls in the past year? 0 0 0 0 0  Number falls in past yr: 0 0 0 0 0  Injury with Fall? 0 0 0 0 0  Risk for fall due to : No Fall Risks Impaired balance/gait No Fall Risks No Fall Risks No Fall Risks  Follow up _0     FALL RISK PREVENTION PERTAINING TO THE HOME:  Any stairs in or around the home? No  If so, are there any without handrails? No  Home free of loose throw rugs in walkways, pet beds, electrical cords, etc? No  Adequate lighting in your home to reduce risk of falls? Yes   ASSISTIVE DEVICES UTILIZED TO PREVENT FALLS:  Life alert? No  Use of a cane, walker or w/c? No  Grab bars in the bathroom? No  Shower chair or bench in shower? No  Elevated toilet seat or a handicapped toilet? No     Cognitive Function:     6CIT Screen 12/06/2019 04/15/2016  What Year? 0 points 0 points  What month? 0 points 0 points  What time? 0 points 0 points  Count back from 20 0 points 0 points  Months in reverse 0 points 0 points  Repeat phrase 0 points 0 points  Total Score 0 0    Immunizations Immunization History  Administered Date(s) Administered   Influenza Whole 12/02/2009   Influenza, Seasonal, Injecte, Preservative Fre 09/18/2013, 03/20/2015   Influenza,inj,Quad PF,6+ Mos 09/18/2013, 03/20/2015, 11/25/2016, 10/19/2017, 10/13/2018, 11/21/2019    PNEUMOCOCCAL CONJUGATE-20 09/10/2020   Pneumococcal Polysaccharide-23 12/02/2009, 03/20/2015    TDAP status: Due, Education has been provided regarding the importance of this vaccine. Advised may receive this vaccine at local pharmacy or Health Dept. Aware to provide a copy of the vaccination record if obtained from local pharmacy or Health Dept. Verbalized acceptance and understanding.  Flu Vaccine status: Due, Education has been provided regarding the importance of this vaccine. Advised may receive this vaccine at local pharmacy or Health Dept. Aware to provide a copy of the vaccination record if obtained from local pharmacy or Health Dept. Verbalized acceptance and understanding.  Pneumococcal vaccine status: Up to date  Covid-19 vaccine status: Information provided on how to obtain vaccines.   Qualifies for Shingles Vaccine? Yes   Zostavax completed No   Shingrix Completed?: No.    Education has been provided regarding the importance of this vaccine. Patient has been advised to call insurance company to determine out of pocket expense if they have not yet received this vaccine. Advised may also receive vaccine at local pharmacy or Health Dept. Verbalized acceptance and understanding.  Screening Tests Health Maintenance  Topic Date Due   COVID-19 Vaccine (1) Never done   Zoster Vaccines- Shingrix (1 of 2) Never done   COLONOSCOPY (Pts 45-47yr Insurance coverage will need to be confirmed)  Never done   FOOT EXAM  06/14/2017   INFLUENZA VACCINE  08/19/2020   TETANUS/TDAP  04/16/2027 (Originally 01/10/1975)   HEMOGLOBIN A1C  03/26/2021   OPHTHALMOLOGY EXAM  07/16/2021   Pneumococcal Vaccine 155645Years old  Completed   Hepatitis C Screening  Completed   HIV Screening  Completed   HPV VACCINES  Aged Out    Health Maintenance  Health Maintenance Due  Topic Date Due   COVID-19 Vaccine (1) Never done   Zoster Vaccines- Shingrix (1 of 2) Never done   COLONOSCOPY (Pts 45-419yr Insurance  coverage will need to be confirmed)  Never done   FOOT EXAM  06/14/2017   INFLUENZA VACCINE  08/19/2020    Colorectal cancer screening: Referral to GI placed 12/09/2020. Pt aware the office will call re: appt.  Lung Cancer Screening: (Low Dose CT Chest recommended if Age 81-80 years, 30 pack-year currently smoking OR have quit w/in 15years.) does not qualify.   Lung Cancer Screening Referral: n/a  Additional Screening:  Hepatitis C Screening: does qualify; Completed 03/20/2015  Vision Screening: Recommended annual ophthalmology exams for early detection of glaucoma and other disorders of the eye. Is the patient up to date with their annual eye exam?  Yes  Who is the provider or what is the name of the office in which the patient attends annual eye exams? [facility] If pt is not established with a provider, would they like to be referred to a provider to establish care?  N/a .   Dental Screening: Recommended annual dental exams for proper oral hygiene  Community Resource Referral / Chronic Care Management: CRR required this visit?  No   CCM required this visit?  No      Plan:     I have personally reviewed and noted the following in the patient's chart:   Medical and social history Use of alcohol, tobacco or illicit drugs  Current medications and supplements including opioid prescriptions. Patient is currently taking opioid prescriptions. Information provided to patient regarding non-opioid alternatives. Patient advised to discuss non-opioid treatment plan with their provider. Functional ability and status Nutritional status Physical activity Advanced directives List of other physicians Hospitalizations, surgeries, and ER visits in previous 12 months Vitals Screenings to include cognitive, depression, and falls Referrals and appointments  In addition, I have reviewed and discussed with patient certain preventive protocols, quality metrics, and best practice  recommendations. A written personalized care plan for preventive services as well as general preventive health recommendations were provided to patient.     Earline [place], [place]   12/09/2020   Nurse Notes:  Mr. Carrizales , Thank you for taking time to come for your Medicare Wellness Visit. I appreciate your ongoing commitment to your health goals. Please review the following plan we discussed and let me know if I can assist you in the future.   These are the goals we discussed:  Goals   None     This is a list of the screening recommended for you and due dates:  Health Maintenance  Topic Date Due   COVID-19 Vaccine (1) Never done   Zoster (Shingles) Vaccine (1 of 2) Never done   Colon Cancer Screening  Never done   Complete foot exam   06/14/2017   Flu Shot  08/19/2020   Tetanus Vaccine  04/16/2027*   Hemoglobin A1C  03/26/2021   Eye exam for diabetics  07/16/2021   Pneumococcal Vaccination  Completed   Hepatitis C Screening: USPSTF Recommendation to screen - Ages 26-79 yo.  Completed   HIV Screening  Completed   HPV Vaccine  Aged Out  *Topic was postponed. The date shown is not the original due date.

## 2020-12-11 ENCOUNTER — Encounter: Payer: Medicare HMO | Admitting: Internal Medicine

## 2020-12-17 DIAGNOSIS — M5416 Radiculopathy, lumbar region: Secondary | ICD-10-CM | POA: Diagnosis not present

## 2020-12-17 DIAGNOSIS — G8929 Other chronic pain: Secondary | ICD-10-CM | POA: Diagnosis not present

## 2020-12-17 DIAGNOSIS — Z9689 Presence of other specified functional implants: Secondary | ICD-10-CM | POA: Diagnosis not present

## 2020-12-17 DIAGNOSIS — M961 Postlaminectomy syndrome, not elsewhere classified: Secondary | ICD-10-CM | POA: Diagnosis not present

## 2020-12-17 DIAGNOSIS — M5441 Lumbago with sciatica, right side: Secondary | ICD-10-CM | POA: Diagnosis not present

## 2020-12-26 ENCOUNTER — Ambulatory Visit: Payer: Medicare HMO | Admitting: "Endocrinology

## 2021-01-29 ENCOUNTER — Ambulatory Visit: Payer: Medicare HMO | Admitting: Gastroenterology

## 2021-02-03 ENCOUNTER — Encounter: Payer: Medicare HMO | Admitting: Internal Medicine

## 2021-02-10 DIAGNOSIS — Z5181 Encounter for therapeutic drug level monitoring: Secondary | ICD-10-CM | POA: Diagnosis not present

## 2021-02-10 DIAGNOSIS — Z79899 Other long term (current) drug therapy: Secondary | ICD-10-CM | POA: Diagnosis not present

## 2021-02-11 DIAGNOSIS — G8929 Other chronic pain: Secondary | ICD-10-CM | POA: Diagnosis not present

## 2021-02-11 DIAGNOSIS — Z9689 Presence of other specified functional implants: Secondary | ICD-10-CM | POA: Diagnosis not present

## 2021-02-11 DIAGNOSIS — M961 Postlaminectomy syndrome, not elsewhere classified: Secondary | ICD-10-CM | POA: Diagnosis not present

## 2021-02-11 DIAGNOSIS — M5441 Lumbago with sciatica, right side: Secondary | ICD-10-CM | POA: Diagnosis not present

## 2021-02-11 DIAGNOSIS — M5416 Radiculopathy, lumbar region: Secondary | ICD-10-CM | POA: Diagnosis not present

## 2021-03-01 ENCOUNTER — Other Ambulatory Visit: Payer: Self-pay | Admitting: Internal Medicine

## 2021-03-01 DIAGNOSIS — E785 Hyperlipidemia, unspecified: Secondary | ICD-10-CM

## 2021-03-01 DIAGNOSIS — E1159 Type 2 diabetes mellitus with other circulatory complications: Secondary | ICD-10-CM

## 2021-03-18 DIAGNOSIS — Z0100 Encounter for examination of eyes and vision without abnormal findings: Secondary | ICD-10-CM | POA: Diagnosis not present

## 2021-03-18 DIAGNOSIS — I10 Essential (primary) hypertension: Secondary | ICD-10-CM | POA: Diagnosis not present

## 2021-03-18 DIAGNOSIS — E119 Type 2 diabetes mellitus without complications: Secondary | ICD-10-CM | POA: Diagnosis not present

## 2021-03-18 DIAGNOSIS — E7800 Pure hypercholesterolemia, unspecified: Secondary | ICD-10-CM | POA: Diagnosis not present

## 2021-03-18 LAB — HM DIABETES EYE EXAM

## 2021-03-19 ENCOUNTER — Encounter: Payer: Self-pay | Admitting: *Deleted

## 2021-03-31 ENCOUNTER — Other Ambulatory Visit: Payer: Self-pay | Admitting: "Endocrinology

## 2021-03-31 DIAGNOSIS — E119 Type 2 diabetes mellitus without complications: Secondary | ICD-10-CM

## 2021-04-14 DIAGNOSIS — G8929 Other chronic pain: Secondary | ICD-10-CM | POA: Diagnosis not present

## 2021-04-14 DIAGNOSIS — M5441 Lumbago with sciatica, right side: Secondary | ICD-10-CM | POA: Diagnosis not present

## 2021-04-14 DIAGNOSIS — Z9689 Presence of other specified functional implants: Secondary | ICD-10-CM | POA: Diagnosis not present

## 2021-04-14 DIAGNOSIS — M961 Postlaminectomy syndrome, not elsewhere classified: Secondary | ICD-10-CM | POA: Diagnosis not present

## 2021-04-14 DIAGNOSIS — M5416 Radiculopathy, lumbar region: Secondary | ICD-10-CM | POA: Diagnosis not present

## 2021-04-29 ENCOUNTER — Ambulatory Visit: Payer: Medicare HMO | Admitting: "Endocrinology

## 2021-05-20 ENCOUNTER — Encounter: Payer: Self-pay | Admitting: "Endocrinology

## 2021-05-20 ENCOUNTER — Ambulatory Visit: Payer: Medicare HMO | Admitting: "Endocrinology

## 2021-05-20 VITALS — BP 138/76 | HR 72 | Ht 70.5 in | Wt 246.4 lb

## 2021-05-20 DIAGNOSIS — E1165 Type 2 diabetes mellitus with hyperglycemia: Secondary | ICD-10-CM | POA: Diagnosis not present

## 2021-05-20 DIAGNOSIS — E6609 Other obesity due to excess calories: Secondary | ICD-10-CM | POA: Diagnosis not present

## 2021-05-20 DIAGNOSIS — E782 Mixed hyperlipidemia: Secondary | ICD-10-CM | POA: Diagnosis not present

## 2021-05-20 DIAGNOSIS — E66811 Obesity, class 1: Secondary | ICD-10-CM

## 2021-05-20 DIAGNOSIS — Z6834 Body mass index (BMI) 34.0-34.9, adult: Secondary | ICD-10-CM

## 2021-05-20 DIAGNOSIS — I10 Essential (primary) hypertension: Secondary | ICD-10-CM

## 2021-05-20 DIAGNOSIS — Z91199 Patient's noncompliance with other medical treatment and regimen due to unspecified reason: Secondary | ICD-10-CM

## 2021-05-20 LAB — POCT GLYCOSYLATED HEMOGLOBIN (HGB A1C): HbA1c, POC (controlled diabetic range): 9.9 % — AB (ref 0.0–7.0)

## 2021-05-20 MED ORDER — ONETOUCH VERIO VI STRP: ORAL_STRIP | 2 refills | Status: DC

## 2021-05-20 MED ORDER — GLIPIZIDE ER 10 MG PO TB24: 10.0000 mg | ORAL_TABLET | Freq: Every day | ORAL | 1 refills | Status: DC

## 2021-05-20 NOTE — Progress Notes (Signed)
? ?                                                             Endocrinology Consult Note  ?     05/20/2021, 7:34 PM ? ? ?Subjective:  ? ? Patient ID: Bryan Wright, male    DOB: May 18, 1955.  ?Bryan Wright is being seen in consultation for management of currently uncontrolled symptomatic diabetes requested by  Lindell Spar, MD. ? ? ?Past Medical History:  ?Diagnosis Date  ? Diabetes mellitus dx in 2008  ? Hyperlipidemia   ? Hypertension 2008  ? ? ?Past Surgical History:  ?Procedure Laterality Date  ? APPENDECTOMY    ? early 20's  ? CERVICAL DISC SURGERY    ? LUNG SURGERY    ? [place] SURGERY  2002  ? cervical  2002  ? SPINE SURGERY  2009, 2010  ? lumbar  ? ? ?Social History  ? ?Socioeconomic History  ? Marital status: Married  ?  Spouse name: Vinnie Level   ? Number of children: 1  ? Years of education: Not on file  ? Highest education level: 8th grade  ?Occupational History  ? 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 Smoking status: Former  ?  Packs/day: 1.50  ?  Years: 5.00  ?  Pack years: 7.50  ?  Types: Cigarettes  ?  Quit date: 62  ?  Years since quitting: 40.3  ? Smokeless tobacco: Former  ?  Types: Chew  ?  Quit date: 06/18/2015  ?Vaping Use  ? Vaping Use: Never used  ?Substance and Sexual Activity  ? Alcohol use: No  ?  Alcohol/week: 0.0 standard drinks  ? Drug use: No  ? Sexual activity: Yes  ?  Birth control/protection: None  ?Other Topics Concern  ? Not on file  ?Social History Narrative  ? Lives with wife (in hospital currently rehab)  ?   ? Two dogs: Precious and Bella   ? Lots of rabbits running loose  ?   ? Son and two grandkids live behind you   ?   ? Enjoys outside, visit friends,  ?   ? Diet: not good, eats meats, fast food since wife has been sick  ? Caffeine: diet soda-usually caffeine free, unsweet tea  ? Water: 2-3 bottles daily   ?   ? Wears seat belt  ? Smoke detectors at home  ? Does not use phone while driving   ? ?Social Determinants of Health   ? ?Financial Resource Strain: Low Risk   ? Difficulty of Paying Living Expenses: Not hard at all  ?Food Insecurity: No Food Insecurity  ? Worried About Charity fundraiser in the Last Year: Never true  ? Ran Out of Food in the Last Year: Never true  ?Transportation Needs: No Transportation Needs  ? Lack of Transportation (Medical): No  ? Lack of Transportation (Non-Medical): No  ?Physical Activity: Insufficiently Active  ? Days of Exercise per Week: 2 days  ? Minutes of Exercise per Session: 30 min  ?Stress: No Stress Concern Present  ? Feeling of Stress : Not at all  ?Social Connections: Moderately Isolated  ? Frequency of Communication with Friends and Family: More than three times a week  ? Frequency of  Social Gatherings with Friends and Family: More than three times a week  ? Attends Religious Services: Never  ? Active Member of Clubs or Organizations: No  ? Attends Archivist Meetings: Never  ? Marital Status: Married  ? ? ?History reviewed. No pertinent family history. ? ?Outpatient Encounter Medications as of 05/20/2021  ?Medication Sig  ? atorvastatin (LIPITOR) 10 MG tablet TAKE 1 TABLET(10 MG) BY MOUTH DAILY  ? benazepril (LOTENSIN) 40 MG tablet TAKE 1 TABLET(40 MG) BY MOUTH DAILY  ? Blood Glucose Monitoring Suppl ([place]) w/Device KIT Use to test blood glucose twice a day-daily before breakfast and at bedtime.  ? cyclobenzaprine (FLEXERIL) 10 MG tablet Take 1 tablet (10 mg total) by mouth 2 (two) times daily as needed for muscle spasms. (Patient not taking: Reported on 09/26/2020)  ? fentaNYL (DURAGESIC - DOSED MCG/HR) 50 MCG/HR Place 50 mcg onto the skin every 3 (three) days.  ? glipiZIDE (GLIPIZIDE XL) 10 MG 24 hr tablet Take 1 tablet (10 mg total) by mouth daily with breakfast.  ? glucose blood (ONETOUCH VERIO) test strip Use as instructed to test blood glucose 4 x a day-daily before meals  and at bedtime.  ? HYDROcodone-Acetaminophen 10-300 MG TABS to 2 tablets every 6 hours  as needed ?Gets 365b tabs per 3 months, from pain clinic in [place] ?Nerve stimulator implanted in 2012 for chronic back pain with failed back surgery x 2  ? metFORMIN (GLUCOPHAGE) 1000 MG tablet TAKE 1 TABLET(1000 MG) BY MOUTH TWICE DAILY WITH A MEAL  ? Multiple Vitamin (THERA) TABS Take 2 tablets by mouth daily.  ? OneTouch Delica Lancets 16X MISC Use to test blood glucose twice a day-daily before breakfast and at bedtime.  ? sildenafil (REVATIO) 20 MG tablet DISSOLVE ONE TROCHE UNDERNEATH THE TONGUE 60 MINUTES PRIOR TO SEXUAL ENCOUNTER.  ? UNABLE TO FIND One touch meter and lancets ?Once daily testing dx e11.9  ? [DISCONTINUED] glipiZIDE (GLIPIZIDE XL) 10 MG 24 hr tablet Take 1 tablet (10 mg total) by mouth daily with breakfast.  ? [DISCONTINUED] glucose blood (ONETOUCH VERIO) test strip Use as instructed to test blood glucose twice a day-daily before breakfast and at bedtime.  ? ?No facility-administered encounter medications on file as of 05/20/2021.  ? ? ?ALLERGIES: ?No Known Allergies ? ?VACCINATION STATUS: ?Immunization History  ?Administered Date(s) Administered  ? Influenza Whole 12/02/2009  ? Influenza, Seasonal, Injecte, Preservative Fre 09/18/2013, 03/20/2015  ? Influenza,inj,Quad PF,6+ Mos 09/18/2013, 03/20/2015, 11/25/2016, 10/19/2017, 10/13/2018, 11/21/2019  ? PNEUMOCOCCAL CONJUGATE-20 09/10/2020  ? Pneumococcal Polysaccharide-23 12/02/2009, 03/20/2015  ? ? ?Diabetes ?He presents for his follow-up diabetic visit. He has type 2 diabetes mellitus. Onset time: He was diagnosed at approximate age of 33 years. His disease course has been worsening (He missed his appointment in December 2022.). There are no hypoglycemic associated symptoms. Pertinent negatives for hypoglycemia include no confusion, headaches, pallor or seizures. Associated symptoms include polydipsia and polyuria. Pertinent negatives for diabetes include no chest pain, no fatigue, no polyphagia and no weakness. There are no hypoglycemic  complications. Symptoms are worsening. There are no diabetic complications. Risk factors for coronary artery disease include diabetes mellitus, dyslipidemia, family history, hypertension, male sex, obesity and sedentary lifestyle. Current diabetic treatments: He is currently on metformin 1000 mg p.o. twice daily, glipizide 10 mg p.o. daily at breakfast. His weight is decreasing steadily. He is following a generally unhealthy diet. When asked about meal planning, he reported none. He has not had a previous visit  with a dietitian. He rarely participates in exercise. His home blood glucose trend is increasing steadily. His overall blood glucose range is >200 mg/dl. (After missing his appointment since September 2022, he returns with worsening glycemic profile and A1c of 9.9%.  His meter shows average of 1 test a day averaging 255 mg per DL.  He ran out of his glipizide currently taking only metformin.   ?) An ACE inhibitor/angiotensin II receptor blocker is being taken.  ?Hyperlipidemia ?This is a chronic problem. The current episode started more than 1 year ago. The problem is controlled. Pertinent negatives include no chest pain, myalgias or shortness of breath. Current antihyperlipidemic treatment includes statins. Risk factors for coronary artery disease include diabetes mellitus, dyslipidemia, family history, hypertension, male sex, a sedentary lifestyle and obesity.  ? ? ?Review of Systems  ?Constitutional:  Negative for chills, fatigue, fever and unexpected weight change.  ?HENT:  Negative for dental problem, mouth sores and trouble swallowing.   ?Eyes:  Negative for visual disturbance.  ?Respiratory:  Negative for cough, choking, chest tightness, shortness of breath and wheezing.   ?Cardiovascular:  Negative for chest pain, palpitations and leg swelling.  ?Gastrointestinal:  Negative for abdominal distention, abdominal pain, constipation, diarrhea, nausea and vomiting.  ?Endocrine: Positive for polydipsia and  polyuria. Negative for polyphagia.  ?Genitourinary:  Negative for dysuria, flank pain, hematuria and urgency.  ?Musculoskeletal:  Negative for back pain, gait problem, myalgias and neck pain.  ?Skin:  Negati

## 2021-05-20 NOTE — Patient Instructions (Signed)

## 2021-05-21 ENCOUNTER — Other Ambulatory Visit: Payer: Self-pay

## 2021-05-21 DIAGNOSIS — E1165 Type 2 diabetes mellitus with hyperglycemia: Secondary | ICD-10-CM

## 2021-05-21 MED ORDER — BLOOD GLUCOSE METER KIT: 1.0000 | PACK | 0 refills | Status: DC

## 2021-05-22 ENCOUNTER — Telehealth: Payer: Self-pay | Admitting: "Endocrinology

## 2021-05-22 NOTE — Telephone Encounter (Signed)
Pharmacy called stating pt's insurance will only cover testing three times daily. ?

## 2021-05-22 NOTE — Telephone Encounter (Signed)
Pt left a voicemail that [facility] reached out to him that they have faxed [place] a paper waiting to be filled out before they can fill his medications. Please advise ?

## 2021-05-22 NOTE — Telephone Encounter (Signed)
Noted. Pharmacy made aware. ?

## 2021-05-26 ENCOUNTER — Ambulatory Visit: Payer: Medicare HMO | Admitting: Nutrition

## 2021-05-28 ENCOUNTER — Encounter: Payer: Self-pay | Admitting: Nutrition

## 2021-05-28 ENCOUNTER — Encounter: Payer: Medicare HMO | Attending: Internal Medicine | Admitting: Nutrition

## 2021-05-28 VITALS — Ht 70.0 in | Wt 249.0 lb

## 2021-05-28 DIAGNOSIS — E1165 Type 2 diabetes mellitus with hyperglycemia: Secondary | ICD-10-CM | POA: Insufficient documentation

## 2021-05-28 DIAGNOSIS — I152 Hypertension secondary to endocrine disorders: Secondary | ICD-10-CM | POA: Insufficient documentation

## 2021-05-28 DIAGNOSIS — E782 Mixed hyperlipidemia: Secondary | ICD-10-CM | POA: Insufficient documentation

## 2021-05-28 DIAGNOSIS — E6609 Other obesity due to excess calories: Secondary | ICD-10-CM | POA: Insufficient documentation

## 2021-05-28 DIAGNOSIS — Z6834 Body mass index (BMI) 34.0-34.9, adult: Secondary | ICD-10-CM | POA: Insufficient documentation

## 2021-05-28 DIAGNOSIS — E1159 Type 2 diabetes mellitus with other circulatory complications: Secondary | ICD-10-CM | POA: Insufficient documentation

## 2021-05-28 NOTE — Patient Instructions (Signed)
Goals ? ?Eat oatmeal for breakfast ?Increase low carb vegetables ?Eat baked and broiled chicken, fish and [place] ?Cut out hamburgers and hot dogs, fries and processed fast foods. ?Drink a gallon of water ?Walk 30 minutes a day ?Get A1C to 7%  ?Lose 1 lb per week. ? ?

## 2021-05-28 NOTE — Progress Notes (Signed)
Medical Nutrition Therapy  ?Appointment Start time:  1300  Appointment End time:  1400 ? ?Primary concerns today: Dm Type 2, Obesity ?Referral diagnosis: E11.8, E66.01 ?Preferred learning style: No Preferences  ?Learning readiness: Change in progress  ? ? ?NUTRITION ASSESSMENT  ? 66 yr old wmale with Type 2 DM,Obesity, HTN and Hyperlipidemia. ? ?Sees Dr. Fransico Him, Endocrinology for his DM. Since seeing Dr. Fransico Him, he has cut out sodas. Trying to work on eating better and earlier before  7pm. Watching what he eats. Grew up eating foods out of the garden. Willing to go back to whole foods, plant based lifestyle to improve his health and get off Dm medications and get healthier. ? ?FBS: 118-120's  Bedtime:140-150's ?Metformin 1000 mg BID, Glipizide  ? ?Current diet is insuffient to meet his  needs to manage his DM, obesity and CVD. ? ?Anthropometrics  ?Wt Readings from Last 3 Encounters:  ?05/28/21 249 lb (112.9 kg)  ?05/20/21 246 lb 6.4 oz (111.8 kg)  ?09/26/20 259 lb 12.8 oz (117.8 kg)  ? ?Ht Readings from Last 3 Encounters:  ?05/28/21 5\' 10"  (1.778 m)  ?05/20/21 5' 10.5" (1.791 m)  ?09/26/20 5' 10.5" (1.791 m)  ? ?Body mass index is 35.73 kg/m?. ?@BMIFA @ ?Facility age limit for growth percentiles is 20 years. ?Facility age limit for growth percentiles is 20 years. ?  ? ?Clinical ?Medical Hx: see cahrt, ?Medications: Metformin, Glipizide ?Labs:  ?Lab Results  ?Component Value Date  ? HGBA1C 9.9 (A) 05/20/2021  ? ? ?  Latest Ref Rng & Units 08/16/2020  ?  3:39 PM 07/21/2020  ?  7:01 PM 07/21/2020  ?  5:52 PM  ?CMP  ?Glucose 65 - 99 mg/dL 09/21/2020   09/21/2020   443    ?BUN 8 - 27 mg/dL 14   15   16     ?Creatinine 0.76 - 1.27 mg/dL 154   008      ?Sodium 134 - 144 mmol/L 137   140   134    ?Potassium 3.5 - 5.2 mmol/L 4.3   4.2   4.0    ?Chloride 96 - 106 mmol/L 99   100   101    ?CO2 20 - 29 mmol/L 20    26    ?Calcium 8.6 - 10.2 mg/dL 9.5    9.1    ? ?Lipid Panel  ?   ?Component Value Date/Time  ? CHOL 149 12/24/2015 1525  ? TRIG 94  12/24/2015 1525  ? HDL 33 (L) 12/24/2015 1525  ? CHOLHDL 4.5 12/24/2015 1525  ? VLDL 19 12/24/2015 1525  ? LDLCALC 97 12/24/2015 1525  ? ? ? ? ?Notable Signs/Symptoms: Increased thirsty, fatigue, frequent urination ? ?Lifestyle & Dietary Hx ?[place]. Eats 2-3 meals per day. Eats out fast food often. He does a lot of the cooking. ? ?Estimated daily fluid intake: 48 oz ?Supplements:  ?Sleep: 8 ?Stress / self-care:  ?Current average weekly physical activity: Walks some ? ?24-Hr Dietary Recall ?First Meal: Bacon, egg and cheese biscuit ?Snack:  ?Second Meal: skips or eats hot dog or hamburger, water ?Snack: misc snack ?Third Meal: Meat and some vegetable, water ?Snack: sometimes fruit or chips or nabs ?Beverages: water ? ?Estimated Energy Needs ?Calories: 1800 ?Carbohydrate: 200g ?Protein: 135g ?Fat: 50g ? ? ?NUTRITION DIAGNOSIS  ?NB-1.1 Food and nutrition-related knowledge deficit As related to Diabetes Type 2.  As evidenced by A1C 9.9%. ? ? ?NUTRITION INTERVENTION  ?Nutrition education (E-1) on the  following topics:  ?Nutrition and Diabetes education provided on My Plate, CHO counting, meal planning, portion sizes, timing of meals, avoiding snacks between meals unless having a low blood sugar, target ranges for A1C and blood sugars, signs/symptoms and treatment of hyper/hypoglycemia, monitoring blood sugars, taking medications as prescribed, benefits of exercising 30 minutes per day and prevention of complications of DM. ?Lifestyle Medicine ?- Whole Food, Plant Predominant Nutrition is highly recommended: Eat Plenty of vegetables, Mushrooms, fruits, Legumes, Whole Grains, Nuts, seeds in lieu of processed meats, processed snacks/pastries red meat, poultry, eggs.  ?  ?-It is better to avoid simple carbohydrates including: Cakes, Sweet Desserts, Ice Cream, Soda (diet and regular), Sweet Tea, Candies, Chips, Cookies, Store Bought Juices, Alcohol in Excess of  1-2 drinks a day, Lemonade,  Artificial  Sweeteners, Doughnuts, Coffee Creamers, "Sugar-free" Products, etc, etc.  This is not a complete list..... ? ?Exercise: If you are able: 30 -60 minutes a day ,4 days a week, or 150 minutes a week.  The longer the better.  Combine stretch, strength, and aerobic activities.  If you were told in the past that you have high risk for cardiovascular diseases, you may seek evaluation by your heart doctor prior to initiating moderate to intense exercise programs. ? ? ?Handouts Provided Include  ?Lifestyle Medicine ?Meal Plan Card ?Calorie Density of foods ?Foods spectrum ? ?Learning Style & Readiness for Change ?Teaching method utilized: Visual & Auditory  ?Demonstrated degree of understanding via: Teach Back  ?Barriers to learning/adherence to lifestyle change: none ? ?Goals Established by Pt ?Goals ? ?Eat oatmeal for breakfast ?Increase low carb vegetables ?Eat baked and broiled chicken, fish and [place] ?Cut out hamburgers and hot dogs, fries and processed fast foods. ?Drink a gallon of water ?Walk 30 minutes a day ?Get A1C to 7%  ?Lose 1 lb per week. ? ? ?MONITORING & EVALUATION ?Dietary intake, weekly physical activity, and blood sugars and weight in 1-3 months. ? ?Next Steps  ?Patient is to work on meal planning and more whole plant based foods. ? ?

## 2021-06-04 ENCOUNTER — Ambulatory Visit: Payer: Medicare HMO | Admitting: "Endocrinology

## 2021-06-08 ENCOUNTER — Other Ambulatory Visit: Payer: Self-pay | Admitting: Internal Medicine

## 2021-06-08 DIAGNOSIS — E785 Hyperlipidemia, unspecified: Secondary | ICD-10-CM

## 2021-06-08 DIAGNOSIS — E1159 Type 2 diabetes mellitus with other circulatory complications: Secondary | ICD-10-CM

## 2021-06-09 DIAGNOSIS — G8929 Other chronic pain: Secondary | ICD-10-CM | POA: Diagnosis not present

## 2021-06-09 DIAGNOSIS — M5416 Radiculopathy, lumbar region: Secondary | ICD-10-CM | POA: Diagnosis not present

## 2021-06-09 DIAGNOSIS — Z9689 Presence of other specified functional implants: Secondary | ICD-10-CM | POA: Diagnosis not present

## 2021-06-09 DIAGNOSIS — M5441 Lumbago with sciatica, right side: Secondary | ICD-10-CM | POA: Diagnosis not present

## 2021-06-09 DIAGNOSIS — M961 Postlaminectomy syndrome, not elsewhere classified: Secondary | ICD-10-CM | POA: Diagnosis not present

## 2021-06-26 ENCOUNTER — Telehealth: Payer: Self-pay | Admitting: "Endocrinology

## 2021-06-26 MED ORDER — METFORMIN HCL 1000 MG PO TABS
ORAL_TABLET | ORAL | 0 refills | Status: DC
Start: 2021-06-26 — End: 2021-09-29

## 2021-06-26 NOTE — Telephone Encounter (Signed)
Pt is requesting a refill on his Metformin to be sent to [facility] on [address]

## 2021-06-26 NOTE — Telephone Encounter (Signed)
Rx sent 

## 2021-07-07 ENCOUNTER — Telehealth: Payer: Self-pay | Admitting: Internal Medicine

## 2021-07-07 NOTE — Telephone Encounter (Signed)
Pt dropped of form to be completed to be exempt from jury duty     (Forms copied/noted/sleeved)

## 2021-07-11 ENCOUNTER — Ambulatory Visit: Payer: Medicare HMO | Admitting: "Endocrinology

## 2021-07-11 ENCOUNTER — Encounter: Payer: Self-pay | Admitting: "Endocrinology

## 2021-07-11 VITALS — BP 134/78 | HR 60 | Ht 70.5 in | Wt 247.6 lb

## 2021-07-11 DIAGNOSIS — E782 Mixed hyperlipidemia: Secondary | ICD-10-CM | POA: Diagnosis not present

## 2021-07-11 DIAGNOSIS — I10 Essential (primary) hypertension: Secondary | ICD-10-CM

## 2021-07-11 DIAGNOSIS — E1165 Type 2 diabetes mellitus with hyperglycemia: Secondary | ICD-10-CM | POA: Diagnosis not present

## 2021-07-11 DIAGNOSIS — E785 Hyperlipidemia, unspecified: Secondary | ICD-10-CM | POA: Diagnosis not present

## 2021-07-11 MED ORDER — ATORVASTATIN CALCIUM 10 MG PO TABS: 10.0000 mg | ORAL_TABLET | Freq: Every day | ORAL | 1 refills | Status: DC

## 2021-07-14 ENCOUNTER — Encounter: Payer: Self-pay | Admitting: Family Medicine

## 2021-07-14 ENCOUNTER — Ambulatory Visit (INDEPENDENT_AMBULATORY_CARE_PROVIDER_SITE_OTHER): Payer: Medicare HMO | Admitting: Family Medicine

## 2021-07-14 ENCOUNTER — Other Ambulatory Visit: Payer: Self-pay | Admitting: Family Medicine

## 2021-07-14 VITALS — BP 128/72 | HR 70 | Ht 70.5 in | Wt 248.4 lb

## 2021-07-14 DIAGNOSIS — M47816 Spondylosis without myelopathy or radiculopathy, lumbar region: Secondary | ICD-10-CM

## 2021-07-14 NOTE — Progress Notes (Signed)
Established Patient Office Visit  Subjective:  Patient ID: Bryan Wright, male    DOB: 12-Jan-1956  Age: 65 y.o. MRN: [id number]  CC:  Chief Complaint  Patient presents with   Back Pain    Pt states he is unable to sit for long periods of time, he would like to be exempted from jury duty needs paper work completed.     HPI Bryan Wright is a 66 y.o. male with past medical history of lumbar spondylosis, radicular pain of lumbosacral region presents with a request to fill out an exemption form for jury duty.  Past Medical History:  Diagnosis Date   Diabetes mellitus dx in 2008   Hyperlipidemia    Hypertension 2008    Past Surgical History:  Procedure Laterality Date   APPENDECTOMY     early 20's   CERVICAL DISC SURGERY     LUNG SURGERY     SPINE SURGERY  2002   cervical  2002   SPINE SURGERY  2009, 2010   lumbar    History reviewed. No pertinent family history.  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  Tobacco Use   Smoking status: Former    Packs/day: 1.50    Years: 5.00    Total pack years: 7.50    Types: Cigarettes    Quit date: 1983    Years since quitting: 40.5   Smokeless tobacco: Former    Types: Chew    Quit date: 06/18/2015  Vaping Use   Vaping Use: Never used  Substance and Sexual Activity   Alcohol use: No    Alcohol/week: 0.0 standard drinks of alcohol   Drug use: No   Sexual activity: Yes    Birth control/protection: None  Other Topics Concern   Not on file  Social History Narrative   Lives with wife (in hospital currently rehab)      Two dogs: Precious and Armed forces operational officer    Lots of rabbits running loose      Son and two grandkids live behind you       Enjoys outside, visit friends,      Diet: not good, eats meats, fast  food since wife has been sick   Caffeine: diet soda-usually caffeine free, unsweet tea   Water: 2-3 bottles daily       Wears seat belt   Smoke detectors at home   Does not use phone while driving    Social Determinants of Health   Financial Resource Strain: Low Risk  (12/09/2020)   Overall Financial Resource Strain (CARDIA)    Difficulty of Paying Living Expenses: Not hard at all  Food Insecurity: No Food Insecurity (12/09/2020)   Hunger Vital Sign    Worried About Running Out of Food in the Last Year: Never true    Ran Out of Food in the Last Year: Never true  Transportation Needs: No Transportation Needs (12/09/2020)   PRAPARE - Administrator, Civil Service (Medical): No    Lack of Transportation (Non-Medical): No  Physical Activity: Insufficiently Active (12/09/2020)   Exercise Vital Sign    Days of Exercise per Week: 2 days    Minutes of Exercise per Session: 30 min  Stress: No Stress Concern Present (12/09/2020)  [employer] of Occupational Health - Occupational Stress Questionnaire    Feeling of Stress : Not at all  Social Connections: Moderately Isolated (12/09/2020)   Social Connection and Isolation Panel [NHANES]    Frequency of Communication with Friends and Family: More than three times a week    Frequency of Social Gatherings with Friends and Family: More than three times a week    Attends Religious Services: Never    Database administrator or Organizations: No    Attends Banker Meetings: Never    Marital Status: Married  Catering manager Violence: Unknown (12/09/2020)   Humiliation, Afraid, Rape, and Kick questionnaire    Fear of Current or Ex-Partner: Not on file    Emotionally Abused: No    Physically Abused: No    Sexually Abused: No    Outpatient Medications Prior to Visit  Medication Sig Dispense Refill   atorvastatin (LIPITOR) 10 MG tablet Take 1 tablet (10 mg total) by mouth at bedtime. 90 tablet 1   benazepril  (LOTENSIN) 40 MG tablet TAKE 1 TABLET(40 MG) BY MOUTH DAILY 90 tablet 0   blood glucose meter kit and supplies 1 each by Other route as directed. Dispense based on patient and insurance preference. Use  four times daily as directed. (FOR ICD-10 E10.9, E11.9). 1 each 0   Blood Glucose Monitoring Suppl (ONETOUCH VERIO FLEX SYSTEM) w/Device KIT Use to test blood glucose twice a day-daily before breakfast and at bedtime. 1 kit 1   cyclobenzaprine (FLEXERIL) 10 MG tablet Take 1 tablet (10 mg total) by mouth 2 (two) times daily as needed for muscle spasms. 20 tablet 0   fentaNYL (DURAGESIC - DOSED MCG/HR) 50 MCG/HR Place 50 mcg onto the skin every 3 (three) days.     glipiZIDE (GLIPIZIDE XL) 10 MG 24 hr tablet Take 1 tablet (10 mg total) by mouth daily with breakfast. 90 tablet 1   glucose blood (ONETOUCH VERIO) test strip Use as instructed to test blood glucose 4 x a day-daily before meals  and at bedtime. 150 each 2   HYDROcodone-Acetaminophen 10-300 MG TABS to 2 tablets every 6 hours as needed Gets 365b tabs per 3 months, from pain clinic in [place] Nerve stimulator implanted in 2012 for chronic back pain with failed back surgery x 2 365 each 0   metFORMIN (GLUCOPHAGE) 1000 MG tablet TAKE 1 TABLET(1000 MG) BY MOUTH TWICE DAILY WITH A MEAL 180 tablet 0   Multiple Vitamin (THERA) TABS Take 2 tablets by mouth daily.     OneTouch Delica Lancets 33G MISC Use to test blood glucose twice a day-daily before breakfast and at bedtime. 200 each 1   sildenafil (REVATIO) 20 MG tablet DISSOLVE ONE TROCHE UNDERNEATH THE TONGUE 60 MINUTES PRIOR TO SEXUAL ENCOUNTER. 10 tablet 0   UNABLE TO FIND One touch meter and lancets Once daily testing dx e11.9 50 each 5   No facility-administered medications prior to visit.    No Known Allergies  ROS Review of Systems  Constitutional:  Negative for chills, fatigue and fever.  Respiratory:  Negative for chest tightness and shortness of breath.   Neurological:  Negative  for dizziness, weakness, light-headedness and headaches.      Objective:    Physical Exam HENT:     Head: Normocephalic.  Cardiovascular:     Rate and Rhythm: Normal rate and regular rhythm.     Pulses: Normal pulses.     Heart sounds: Normal heart sounds.  Pulmonary:  Effort: Pulmonary effort is normal.     Breath sounds: Normal breath sounds.  Abdominal:     Palpations: Abdomen is soft.  Neurological:     Mental Status: He is alert.     BP 128/72 (BP Location: Left Arm)   Pulse 70   Ht 5' 10.5" (1.791 m)   Wt 248 lb 6.4 oz (112.7 kg)   SpO2 93%   BMI 35.14 kg/m  Wt Readings from Last 3 Encounters:  07/14/21 248 lb 6.4 oz (112.7 kg)  07/11/21 247 lb 9.6 oz (112.3 kg)  05/28/21 249 lb (112.9 kg)    Lab Results  Component Value Date   TSH 0.414 (L) 08/16/2020   Lab Results  Component Value Date   WBC 9.8 08/16/2020   HGB 14.5 08/16/2020   HCT 42.6 08/16/2020   MCV 90 08/16/2020   PLT 205 08/16/2020   Lab Results  Component Value Date   NA 137 08/16/2020   K 4.3 08/16/2020   CO2 20 08/16/2020   GLUCOSE 288 (H) 08/16/2020   BUN 14 08/16/2020   CREATININE 0.90 08/16/2020   BILITOT 0.5 12/24/2015   ALKPHOS 54 12/24/2015   AST 33 12/24/2015   ALT 28 12/24/2015   PROT 7.1 12/24/2015   ALBUMIN 3.8 12/24/2015   CALCIUM 9.5 08/16/2020   ANIONGAP 7 07/21/2020   EGFR 97 08/16/2020   Lab Results  Component Value Date   CHOL 149 12/24/2015   Lab Results  Component Value Date   HDL 33 (L) 12/24/2015   Lab Results  Component Value Date   LDLCALC 97 12/24/2015   Lab Results  Component Value Date   TRIG 94 12/24/2015   Lab Results  Component Value Date   CHOLHDL 4.5 12/24/2015   Lab Results  Component Value Date   HGBA1C 9.9 (A) 05/20/2021      Assessment & Plan:   Problem List Items Addressed This Visit       Musculoskeletal and Integument   Lumbar spondylosis    Jury duty exemption form is filled       No orders of the defined  types were placed in this encounter.   Follow-up: Return if symptoms worsen or fail to improve.    Gilmore Laroche, FNP

## 2021-08-04 DIAGNOSIS — Z79899 Other long term (current) drug therapy: Secondary | ICD-10-CM | POA: Diagnosis not present

## 2021-08-04 DIAGNOSIS — Z9689 Presence of other specified functional implants: Secondary | ICD-10-CM | POA: Diagnosis not present

## 2021-08-04 DIAGNOSIS — Z5181 Encounter for therapeutic drug level monitoring: Secondary | ICD-10-CM | POA: Diagnosis not present

## 2021-08-04 DIAGNOSIS — M5416 Radiculopathy, lumbar region: Secondary | ICD-10-CM | POA: Diagnosis not present

## 2021-08-04 DIAGNOSIS — M5441 Lumbago with sciatica, right side: Secondary | ICD-10-CM | POA: Diagnosis not present

## 2021-08-04 DIAGNOSIS — G8929 Other chronic pain: Secondary | ICD-10-CM | POA: Diagnosis not present

## 2021-08-04 DIAGNOSIS — M961 Postlaminectomy syndrome, not elsewhere classified: Secondary | ICD-10-CM | POA: Diagnosis not present

## 2021-08-25 ENCOUNTER — Encounter: Payer: Medicare HMO | Admitting: Internal Medicine

## 2021-08-26 ENCOUNTER — Ambulatory Visit: Payer: Medicare HMO | Admitting: "Endocrinology

## 2021-08-26 ENCOUNTER — Encounter: Payer: Self-pay | Admitting: "Endocrinology

## 2021-08-26 VITALS — BP 110/62 | HR 72 | Ht 70.5 in | Wt 246.2 lb

## 2021-08-26 DIAGNOSIS — E782 Mixed hyperlipidemia: Secondary | ICD-10-CM

## 2021-08-26 DIAGNOSIS — E6609 Other obesity due to excess calories: Secondary | ICD-10-CM | POA: Diagnosis not present

## 2021-08-26 DIAGNOSIS — I10 Essential (primary) hypertension: Secondary | ICD-10-CM | POA: Diagnosis not present

## 2021-08-26 DIAGNOSIS — E1165 Type 2 diabetes mellitus with hyperglycemia: Secondary | ICD-10-CM | POA: Diagnosis not present

## 2021-08-26 DIAGNOSIS — Z6834 Body mass index (BMI) 34.0-34.9, adult: Secondary | ICD-10-CM

## 2021-08-26 LAB — POCT GLYCOSYLATED HEMOGLOBIN (HGB A1C): HbA1c, POC (controlled diabetic range): 7.7 % — AB (ref 0.0–7.0)

## 2021-08-26 NOTE — Patient Instructions (Signed)
                                     Advice for Weight Management  -For most of us the best way to lose weight is by diet management. Generally speaking, diet management means consuming less calories intentionally which over time brings about progressive weight loss.  This can be achieved more effectively by avoiding ultra processed carbohydrates, processed meats, unhealthy fats.    It is critically important to know your numbers: how much calorie you are consuming and how much calorie you need. More importantly, our carbohydrates sources should be unprocessed naturally occurring  complex starch food items.  It is always important to balance nutrition also by  appropriate intake of proteins (mainly plant-based), healthy fats/oils, plenty of fruits and vegetables.   -The American College of Lifestyle Medicine (ACL M) recommends nutrition derived mostly from Whole Food, Plant Predominant Sources example an apple instead of applesauce or apple pie. Eat Plenty of vegetables, Mushrooms, fruits, Legumes, Whole Grains, Nuts, seeds in lieu of processed meats, processed snacks/pastries red meat, poultry, eggs.  Use only water or unsweetened tea for hydration.  The College also recommends the need to stay away from risky substances including alcohol, smoking; obtaining 7-9 hours of restorative sleep, at least 150 minutes of moderate intensity exercise weekly, importance of healthy social connections, and being mindful of stress and seek help when it is overwhelming.    -Sticking to a routine mealtime to eat 3 meals a day and avoiding unnecessary snacks is shown to have a big role in weight control. Under normal circumstances, the only time we burn stored energy is when we are hungry, so allow  some hunger to take place- hunger means no food between appropriate meal times, only water.  It is not advisable to starve.   -It is better to avoid simple carbohydrates including:  Cakes, Sweet Desserts, Ice Cream, Soda (diet and regular), Sweet Tea, Candies, Chips, Cookies, Store Bought Juices, Alcohol in Excess of  1-2 drinks a day, Lemonade,  Artificial Sweeteners, Doughnuts, Coffee Creamers, "Sugar-free" Products, etc, etc.  This is not a complete list.....    -Consulting with certified diabetes educators is proven to provide you with the most accurate and current information on diet.  Also, you may be  interested in discussing diet options/exchanges , we can schedule a visit with Bryan Wright, RDN, CDE for individualized nutrition education.  -Exercise: If you are able: 30 -60 minutes a day ,4 days a week, or 150 minutes of moderate intensity exercise weekly.    The longer the better if tolerated.  Combine stretch, strength, and aerobic activities.  If you were told in the past that you have high risk for cardiovascular diseases, or if you are currently symptomatic, you may seek evaluation by your heart doctor prior to initiating moderate to intense exercise programs.                                  Additional Care Considerations for Diabetes/Prediabetes   -Diabetes  is a chronic disease.  The most important care consideration is regular follow-up with your diabetes care provider with the goal being avoiding or delaying its complications and to take advantage of advances in medications and technology.  If appropriate actions are taken early enough, type 2 diabetes can even be   reversed.  Seek information from the right source.  - Whole Food, Plant Predominant Nutrition is highly recommended: Eat Plenty of vegetables, Mushrooms, fruits, Legumes, Whole Grains, Nuts, seeds in lieu of processed meats, processed snacks/pastries red meat, poultry, eggs as recommended by American College of  Lifestyle Medicine (ACLM).  -Type 2 diabetes is known to coexist with other important comorbidities such as high blood pressure and high cholesterol.  It is critical to control not only the  diabetes but also the high blood pressure and high cholesterol to minimize and delay the risk of complications including coronary artery disease, stroke, amputations, blindness, etc.  The good news is that this diet recommendation for type 2 diabetes is also very helpful for managing high cholesterol and high blood blood pressure.  - Studies showed that people with diabetes will benefit from a class of medications known as ACE inhibitors and statins.  Unless there are specific reasons not to be on these medications, the standard of care is to consider getting one from these groups of medications at an optimal doses.  These medications are generally considered safe and proven to help protect the heart and the kidneys.    - People with diabetes are encouraged to initiate and maintain regular follow-up with eye doctors, foot doctors, dentists , and if necessary heart and kidney doctors.     - It is highly recommended that people with diabetes quit smoking or stay away from smoking, and get yearly  flu vaccine and pneumonia vaccine at least every 5 years.  See above for additional recommendations on exercise, sleep, stress management , and healthy social connections.      

## 2021-08-26 NOTE — Progress Notes (Signed)
08/26/2021, 6:05 PM   Endocrinology follow-up note  Subjective:    Patient ID: Bryan Wright, male    DOB: 07/18/1955.  Bryan Wright is being seen in consultation for management of currently uncontrolled symptomatic diabetes requested by  Lindell Spar, MD.   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  Tobacco Use   Smoking status: Former    Packs/day: 1.50    Years: 5.00    Total pack years: 7.50    Types: Cigarettes    Quit date: 1983    Years since quitting: 40.6   Smokeless tobacco: Former    Types: Chew    Quit date: 06/18/2015  Vaping Use   Vaping Use: Never used  Substance and Sexual Activity   Alcohol use: No    Alcohol/week: 0.0 standard drinks of alcohol   Drug use: No   Sexual activity: Yes    Birth control/protection: None  Other Topics Concern   Not on file  Social History Narrative   Lives with wife (in hospital currently rehab)      Two dogs: Precious and [place] of rabbits running loose      Son and two grandkids live behind you       Enjoys outside, visit friends,      Diet: not good, eats meats, fast food since wife has been sick   Caffeine: diet soda-usually caffeine free, unsweet tea   Water: 2-3 bottles daily       Wears seat belt   Smoke detectors at home   Does not use phone while driving    Social  Determinants of Health   Financial Resource Strain: Low Risk  (12/09/2020)   Overall Financial Resource Strain (CARDIA)    Difficulty of Paying Living Expenses: Not hard at all  Food Insecurity: No Food Insecurity (12/09/2020)   Hunger Vital Sign    Worried About Running Out of Food in the Last Year: Never true    [place] in the Last Year: Never true  Transportation Needs: No Transportation Needs (12/09/2020)   PRAPARE - Hydrologist (Medical): No    Lack of Transportation (Non-Medical): No  Physical Activity: Insufficiently Active (12/09/2020)   Exercise Vital Sign    Days of Exercise per Week: 2 days    Minutes of Exercise per Session: 30 min  Stress: No Stress Concern Present (12/09/2020)  [employer] - Occupational Stress Questionnaire    Feeling of Stress : Not at all  Social Connections: Moderately Isolated (12/09/2020)   Social Connection and Isolation Panel [NHANES]    Frequency of Communication with Friends and Family: More than three times a week    Frequency of Social Gatherings with Friends and Family: More than three times a week    Attends Religious Services: Never    Marine scientist or Organizations: No    Attends Archivist Meetings: Never    Marital Status: Married    History reviewed. No pertinent family history.  Outpatient Encounter Medications as of 08/26/2021  Medication Sig   atorvastatin (LIPITOR) 10 MG tablet Take 1 tablet (10 mg total) by mouth at bedtime.   benazepril (LOTENSIN) 40 MG tablet TAKE 1 TABLET(40 MG) BY MOUTH DAILY   blood glucose meter kit and supplies 1 each by Other route as directed. Dispense based on patient and insurance preference. Use  four times daily as directed. (FOR ICD-10 E10.9, E11.9).   Blood Glucose Monitoring Suppl ([place]) w/Device KIT Use to test blood glucose twice a day-daily before breakfast and at bedtime.    cyclobenzaprine (FLEXERIL) 10 MG tablet Take 1 tablet (10 mg total) by mouth 2 (two) times daily as needed for muscle spasms.   fentaNYL (DURAGESIC - DOSED MCG/HR) 50 MCG/HR Place 50 mcg onto the skin every 3 (three) days.   glipiZIDE (GLIPIZIDE XL) 10 MG 24 hr tablet Take 1 tablet (10 mg total) by mouth daily with breakfast.   glucose blood (ONETOUCH VERIO) test strip Use as instructed to test blood glucose 4 x a day-daily before meals  and at bedtime.   HYDROcodone-Acetaminophen 10-300 MG TABS to 2 tablets every 6 hours as needed Gets 365b tabs per 3 months, from pain clinic in [place] Nerve stimulator implanted in 2012 for chronic back pain with failed back surgery x 2   metFORMIN (GLUCOPHAGE) 1000 MG tablet TAKE 1 TABLET(1000 MG) BY MOUTH TWICE DAILY WITH A MEAL   Multiple Vitamin (THERA) TABS Take 2 tablets by mouth daily.   OneTouch Delica Lancets 96E MISC Use to test blood glucose twice a day-daily before breakfast and at bedtime.   sildenafil (REVATIO) 20 MG tablet DISSOLVE ONE TROCHE UNDERNEATH THE TONGUE 60 MINUTES PRIOR TO SEXUAL ENCOUNTER.   UNABLE TO FIND One touch meter and lancets Once daily testing dx e11.9   No facility-administered encounter medications on file as of 08/26/2021.    ALLERGIES: No Known Allergies  VACCINATION STATUS: Immunization History  Administered Date(s) Administered   Influenza Whole 12/02/2009   Influenza, Seasonal, Injecte, Preservative Fre 09/18/2013, 03/20/2015   Influenza,inj,Quad PF,6+ Mos 09/18/2013, 03/20/2015, 11/25/2016, 10/19/2017, 10/13/2018, 11/21/2019   PNEUMOCOCCAL CONJUGATE-20 09/10/2020   Pneumococcal Polysaccharide-23 12/02/2009, 03/20/2015    Diabetes He presents for his follow-up diabetic visit. He has type 2 diabetes mellitus. Onset time: He was diagnosed at approximate age of 64 years. His disease course has been improving. There are no hypoglycemic associated symptoms. Pertinent negatives for hypoglycemia include no confusion,  headaches, pallor or seizures. Pertinent negatives for diabetes include no chest pain, no fatigue, no polydipsia, no polyphagia, no polyuria and no weakness. There are no hypoglycemic complications. Symptoms are worsening. There are no diabetic complications. Risk factors for coronary artery disease include diabetes mellitus, dyslipidemia, family history, hypertension, male sex, obesity and sedentary lifestyle. Current diabetic treatments: He is currently on metformin 1000 mg p.o. twice daily, glipizide  10 mg p.o. daily at breakfast. His weight is fluctuating minimally. He is following a generally unhealthy diet. When asked about meal planning, he reported none. He has not had a previous visit with a dietitian. He rarely participates in exercise. His home blood glucose trend is decreasing steadily. His breakfast blood glucose range is generally 140-180 mg/dl. His overall blood glucose range is 140-180 mg/dl. (He presents with significant improvement in his glycemic profile and point-of-care A1c was 7.7% improving from 9.9%.  He did not document hypoglycemia.    ) An ACE inhibitor/angiotensin II receptor blocker is being taken.  Hyperlipidemia This is a chronic problem. The current episode started more than 1 year ago. The problem is controlled. Pertinent negatives include no chest pain, myalgias or shortness of breath. Current antihyperlipidemic treatment includes statins. Risk factors for coronary artery disease include diabetes mellitus, dyslipidemia, family history, hypertension, male sex, a sedentary lifestyle and obesity.     Review of Systems  Constitutional:  Negative for chills, fatigue, fever and unexpected weight change.  HENT:  Negative for dental problem, mouth sores and trouble swallowing.   Eyes:  Negative for visual disturbance.  Respiratory:  Negative for cough, choking, chest tightness, shortness of breath and wheezing.   Cardiovascular:  Negative for chest pain, palpitations and leg  swelling.  Gastrointestinal:  Negative for abdominal distention, abdominal pain, constipation, diarrhea, nausea and vomiting.  Endocrine: Negative for polydipsia, polyphagia and polyuria.  Genitourinary:  Negative for dysuria, flank pain, hematuria and urgency.  Musculoskeletal:  Negative for back pain, gait problem, myalgias and neck pain.  Skin:  Negative for pallor, rash and wound.  Neurological:  Negative for seizures, syncope, weakness, numbness and headaches.  Psychiatric/Behavioral:  Negative for confusion and dysphoric mood.     Objective:       08/26/2021    2:15 PM 07/14/2021   10:10 AM 07/14/2021   10:06 AM  Vitals with BMI  Height 5' 10.5"  5' 10.5"  Weight 246 lbs 3 oz  248 lbs 6 oz  BMI 89.21  19.41  Systolic 740 814 481  Diastolic 62 72 75  Pulse 72  70    BP 110/62   Pulse 72   Ht 5' 10.5" (1.791 m)   Wt 246 lb 3.2 oz (111.7 kg)   BMI 34.83 kg/m   Wt Readings from Last 3 Encounters:  08/26/21 246 lb 3.2 oz (111.7 kg)  07/14/21 248 lb 6.4 oz (112.7 kg)  07/11/21 247 lb 9.6 oz (112.3 kg)      CMP ( most recent) CMP     Component Value Date/Time   NA 137 08/16/2020 1539   K 4.3 08/16/2020 1539   CL 99 08/16/2020 1539   CO2 20 08/16/2020 1539   GLUCOSE 288 (H) 08/16/2020 1539   GLUCOSE 269 (H) 07/21/2020 1901   BUN 14 08/16/2020 1539   CREATININE 0.90 08/16/2020 1539   CREATININE 0.82 06/11/2016 1448   CALCIUM 9.5 08/16/2020 1539   PROT 7.1 12/24/2015 1525   ALBUMIN 3.8 12/24/2015 1525   AST 33 12/24/2015 1525   ALT 28 12/24/2015 1525   ALKPHOS 54 12/24/2015 1525   BILITOT 0.5 12/24/2015 1525   GFRNONAA >60 07/21/2020 1752   GFRNONAA >89 06/11/2016 1448   GFRAA >89 06/11/2016 1448     Diabetic Labs (most recent): Lab Results  Component Value Date   HGBA1C 7.7 (A) 08/26/2021   HGBA1C 9.9 (A) 05/20/2021   HGBA1C 8.9 (A) 09/26/2020   MICROALBUR 1.23 09/18/2013  MICROALBUR 1.43 08/05/2012   MICROALBUR 2.83 (H) 12/02/2009     Lipid Panel  ( most recent) Lipid Panel     Component Value Date/Time   CHOL 149 12/24/2015 1525   TRIG 94 12/24/2015 1525   HDL 33 (L) 12/24/2015 1525   CHOLHDL 4.5 12/24/2015 1525   VLDL 19 12/24/2015 1525   LDLCALC 97 12/24/2015 1525      Lab Results  Component Value Date   TSH 0.414 (L) 08/16/2020   TSH 1.17 06/11/2016   TSH 0.72 03/20/2015   TSH 1.234 02/28/2014   TSH 0.634 08/05/2012   FREET4 1.28 08/16/2020     Assessment & Plan:   1. Type 2 diabetes mellitus with hyperglycemia, without long-term current use of insulin (HCC)   - Bryan Wright has currently uncontrolled symptomatic type 2 DM since  66 years of age.  He presents with significant improvement in his glycemic profile and point-of-care A1c was 7.7% improving from 9.9%.  He did not document hypoglycemia.   Recent labs reviewed. - I had a long discussion with him about the progressive nature of diabetes and the pathology behind its complications. -his diabetes is complicated by obesity/sedentary life and he remains at a high risk for more acute and chronic complications which include CAD, CVA, CKD, retinopathy, and neuropathy. These are all discussed in detail with him.  - I have counseled him on diet  and weight management  by adopting a carbohydrate restricted/protein rich diet. Patient is encouraged to switch to  unprocessed or minimally processed     complex starch and increased protein intake (animal or plant source), fruits, and vegetables. -  he is advised to stick to a routine mealtimes to eat 3 meals  a day and avoid unnecessary snacks ( to snack only to correct hypoglycemia).   - he acknowledges that there is a room for improvement in his food and drink choices. - Suggestion is made for him to avoid simple carbohydrates  from his diet including Cakes, Sweet Desserts, Ice Cream, Soda (diet and regular), Sweet Tea, Candies, Chips, Cookies, Store Bought Juices, Alcohol , Artificial Sweeteners,  Coffee Creamer, and  "Sugar-free" Products, Lemonade. This will help patient to have more stable blood glucose profile and potentially avoid unintended weight gain.  The following Lifestyle Medicine recommendations according to [place]  [facility]) were discussed and and offered to patient and he  agrees to start the journey:  A. Whole Foods, Plant-Based Nutrition comprising of fruits and vegetables, plant-based proteins, whole-grain carbohydrates was discussed in detail with the patient.   A list for source of those nutrients were also provided to the patient.  Patient will use only water or unsweetened tea for hydration. B.  The need to stay away from risky substances including alcohol, smoking; obtaining 7 to 9 hours of restorative sleep, at least 150 minutes of moderate intensity exercise weekly, the importance of healthy social connections,  and stress management techniques were discussed. C.  A full color page of  Calorie density of various food groups per pound showing examples of each food groups was provided to the patient.    - he has been  scheduled with Jearld Fenton, RDN, CDE for diabetes education.  - I have approached him with the following individualized plan to manage  his diabetes and patient agrees:   -In light of his presentation with significantly improved glycemic profile to target, he will not need insulin treatment for now.  He is  advised to continue metformin 1000 mg p.o. twice daily, glipizide 10 mg XL p.o. daily at breakfast.    -He is encouraged and advised to monitor blood glucose twice a day-daily before breakfast and at bedtime.    - he is encouraged to call clinic for blood glucose levels less than 70 or above 200 mg per DL  at fasting.  - he will be considered for incretin therapy or basal insulin as appropriate next visit.  - Specific targets for  A1c;  LDL, HDL,  and Triglycerides were discussed with the patient.  2) Blood Pressure /Hypertension:    His blood pressure is controlled to target.  he is advised to continue his current medications including benazepril 40 mg p.o. daily with breakfast .  3) Lipids/Hyperlipidemia:   Review of his recent lipid panel showed  uncontrolled  LDL at 97 .  he  is advised to continue atorvastatin 10 mg p.o. daily daily at bedtime . side effects and precautions discussed with him.  4)  Weight/Diet:  Body mass index is 34.83 kg/m.  -   clearly complicating his diabetes care.   he is  a candidate for weight loss. I discussed with him the fact that loss of 5 - 10% of his  current body weight will have the most impact on his diabetes management.  Exercise, and detailed carbohydrates information provided  -  detailed on discharge instructions.  5) Chronic Care/Health Maintenance:  -he  is on ACEI/ARB and Statin medications and  is encouraged to initiate and continue to follow up with Ophthalmology, Dentist,  Podiatrist at least yearly or according to recommendations, and advised to   stay away from smoking. I have recommended yearly flu vaccine and pneumonia vaccine at least every 5 years; moderate intensity exercise for up to 150 minutes weekly; and  sleep for at least 7 hours a day.  6) subclinical hyperthyroidism-TSH slightly suppressed at 0.41.  His thyroid hormones are normal.  He will not need any antithyroid intervention at this time.  He will need repeat full profile thyroid function test on subsequent visits.      No clinical goiter, no need for thyroid imaging for now.   - he is  advised to maintain close follow up with Lindell Spar, MD for primary care needs, as well as his other providers for optimal and coordinated care.   I spent 41 minutes in the care of the patient today including review of labs from [place], Lipids, Thyroid Function, Hematology (current and previous including abstractions from other facilities); face-to-face time discussing  his blood glucose readings/logs, discussing  hypoglycemia and hyperglycemia episodes and symptoms, medications doses, his options of short and long term treatment based on the latest standards of care / guidelines;  discussion about incorporating lifestyle medicine;  and documenting the encounter. Risk reduction counseling performed per USPSTF guidelines to reduce obesity and cardiovascular risk factors.     Please refer to Patient Instructions for Blood Glucose Monitoring and Insulin/Medications Dosing Guide"  in media tab for additional information. Please  also refer to " Patient Self Inventory" in the Media  tab for reviewed elements of pertinent patient history.  Bryan Wright participated in the discussions, expressed understanding, and voiced agreement with the above plans.  All questions were answered to his satisfaction. he is encouraged to contact clinic should he have any questions or concerns prior to his return visit.     Follow up plan: - Return in about 3 months (around  11/26/2021) for F/U with Pre-visit Labs, Meter/CGM/Logs, A1c here.  Glade Lloyd, MD [facility] [address] [place], [place] [address] Phone: [phone]  Fax: [phone]    08/26/2021, 6:05 PM  This note was partially dictated with voice recognition software. Similar sounding words can be transcribed inadequately or may not  be corrected upon review.

## 2021-09-28 ENCOUNTER — Other Ambulatory Visit: Payer: Self-pay | Admitting: "Endocrinology

## 2021-09-29 DIAGNOSIS — Z9689 Presence of other specified functional implants: Secondary | ICD-10-CM | POA: Diagnosis not present

## 2021-09-29 DIAGNOSIS — G894 Chronic pain syndrome: Secondary | ICD-10-CM | POA: Diagnosis not present

## 2021-09-29 DIAGNOSIS — M5416 Radiculopathy, lumbar region: Secondary | ICD-10-CM | POA: Diagnosis not present

## 2021-09-29 DIAGNOSIS — M961 Postlaminectomy syndrome, not elsewhere classified: Secondary | ICD-10-CM | POA: Diagnosis not present

## 2021-10-22 DIAGNOSIS — E1165 Type 2 diabetes mellitus with hyperglycemia: Secondary | ICD-10-CM | POA: Diagnosis not present

## 2021-10-23 LAB — VITAMIN D 25 HYDROXY (VIT D DEFICIENCY, FRACTURES): Vit D, 25-Hydroxy: 27.5 ng/mL — ABNORMAL LOW (ref 30.0–100.0)

## 2021-10-23 LAB — LIPID PANEL
Chol/HDL Ratio: 2.7 ratio (ref 0.0–5.0)
Cholesterol, Total: 122 mg/dL (ref 100–199)
HDL: 45 mg/dL (ref >39)
LDL Chol Calc (NIH): 64 mg/dL (ref 0–99)
Triglycerides: 63 mg/dL (ref 0–149)
VLDL Cholesterol Cal: 13 mg/dL (ref 5–40)

## 2021-10-23 LAB — COMPREHENSIVE METABOLIC PANEL
ALT: 16 IU/L (ref 0–44)
AST: 24 IU/L (ref 0–40)
Albumin/Globulin Ratio: 2 (ref 1.2–2.2)
Albumin: 4.7 g/dL (ref 3.9–4.9)
Alkaline Phosphatase: 66 IU/L (ref 44–121)
BUN/Creatinine Ratio: 20 (ref 10–24)
BUN: 20 mg/dL (ref 8–27)
Bilirubin Total: 0.6 mg/dL (ref 0.0–1.2)
CO2: 22 mmol/L (ref 20–29)
Calcium: 9.4 mg/dL (ref 8.6–10.2)
Chloride: 103 mmol/L (ref 96–106)
Creatinine, Ser: 0.99 mg/dL (ref 0.76–1.27)
Globulin, Total: 2.3 g/dL (ref 1.5–4.5)
Glucose: 154 mg/dL — ABNORMAL HIGH (ref 70–99)
Potassium: 4.5 mmol/L (ref 3.5–5.2)
Sodium: 140 mmol/L (ref 134–144)
Total Protein: 7 g/dL (ref 6.0–8.5)
eGFR: 86 mL/min/{1.73_m2} (ref >59)

## 2021-10-23 LAB — T4, FREE: Free T4: 1.39 ng/dL (ref 0.82–1.77)

## 2021-10-23 LAB — TSH: TSH: 0.432 u[IU]/mL — ABNORMAL LOW (ref 0.450–4.500)

## 2021-10-30 ENCOUNTER — Other Ambulatory Visit: Payer: Self-pay | Admitting: Internal Medicine

## 2021-10-30 DIAGNOSIS — I152 Hypertension secondary to endocrine disorders: Secondary | ICD-10-CM

## 2021-10-30 MED ORDER — BENAZEPRIL HCL 40 MG PO TABS: 40.0000 mg | ORAL_TABLET | Freq: Every day | ORAL | 1 refills | Status: DC

## 2021-11-12 ENCOUNTER — Other Ambulatory Visit: Payer: Self-pay | Admitting: "Endocrinology

## 2021-11-12 DIAGNOSIS — H52221 Regular astigmatism, right eye: Secondary | ICD-10-CM | POA: Diagnosis not present

## 2021-11-12 DIAGNOSIS — E113393 Type 2 diabetes mellitus with moderate nonproliferative diabetic retinopathy without macular edema, bilateral: Secondary | ICD-10-CM | POA: Diagnosis not present

## 2021-11-12 DIAGNOSIS — Z91199 Patient's noncompliance with other medical treatment and regimen due to unspecified reason: Secondary | ICD-10-CM

## 2021-11-12 DIAGNOSIS — H2513 Age-related nuclear cataract, bilateral: Secondary | ICD-10-CM | POA: Diagnosis not present

## 2021-11-26 ENCOUNTER — Ambulatory Visit (INDEPENDENT_AMBULATORY_CARE_PROVIDER_SITE_OTHER): Payer: Medicare HMO | Admitting: Internal Medicine

## 2021-11-26 ENCOUNTER — Ambulatory Visit: Payer: Medicare HMO | Admitting: "Endocrinology

## 2021-11-26 ENCOUNTER — Encounter: Payer: Self-pay | Admitting: Internal Medicine

## 2021-11-26 VITALS — BP 165/71 | HR 80 | Resp 16 | Ht 70.5 in | Wt 254.0 lb

## 2021-11-26 DIAGNOSIS — M7521 Bicipital tendinitis, right shoulder: Secondary | ICD-10-CM | POA: Diagnosis not present

## 2021-11-26 NOTE — Progress Notes (Signed)
     CC: Shoulder Pain (Right shoulder pain around 3 weeks ago @ 10/11.  Aches at night and it hurt to raise it up but it feels good today )    HPI:Mr.Bryan Wright is a 66 y.o. male who presents for evaluation of right shoulder pain. For the details of today's visit, please refer to the assessment and plan.   Past Medical History:  Diagnosis Date   Diabetes mellitus dx in 2008   Hyperlipidemia    Hypertension 2008     Physical Exam: Vitals:   11/26/21 1602  BP: (!) 165/71  Pulse: 80  Resp: 16  SpO2: 93%  Weight: 254 lb (115.2 kg)  Height: 5' 10.5" (1.791 m)    Shoulder: No obvious deformity or asymmetry. No bruising. No swelling TTP over proximal bicep at bicipital groove. Full ROM in flexion, abduction, internal/external rotation NV intact distally Special Tests:  - Impingement: Neg [place] and [place].  - Supraspinatus: Negative empty can.  5/5 strength - Infraspinatus/Teres: 5/5 strength with ER - Subscapularis: 5/5 strength with IR - Biceps tendon: Negative Speeds. Pain at bicipital groove with resistance .      Assessment & Plan:   Biceps tendonitis on right Patient complaints of right shoulder pain for 3 weeks. It started suddenly with cranking lawn mower or chain saw. The pain is described as aching.  Location is anterior. No history of dislocation. Symptoms are aggravated by flexion and supination of forearm. Symptoms have improved with taking 400 mg ibuprofen at night.   Assessment/Plan: Bicep Tendinitis . Pain improved on day of exam but exam maneuvers reproduce. Recommend avoid painful activities. Ice area 15 minutes at a time 3-4 times a day if needed .Take aleve or ibuprofen as needed.    Milus Banister, MD

## 2021-11-26 NOTE — Patient Instructions (Addendum)
Thank you for trusting me with your care. To recap, today we discussed the following:   Bicep Tendonitis based on exam. I am glad your pain has improved.  Avoid painful activities Ice area 15 minutes at a time 3-4 times a day if needed  Take aleve or ibuprofen as needed.

## 2021-11-26 NOTE — Assessment & Plan Note (Addendum)
Patient complaints of right shoulder pain for 3 weeks. It started suddenly with cranking lawn mower or chain saw. The pain is described as aching.  Location is anterior. No history of dislocation. Symptoms are aggravated by flexion and supination of forearm. Symptoms have improved with taking 400 mg ibuprofen at night.   Assessment/Plan: Bicep Tendinitis . Pain improved on day of exam but exam maneuvers reproduce. Recommend avoid painful activities. Ice area 15 minutes at a time 3-4 times a day if needed .Take aleve or ibuprofen as needed.

## 2021-11-28 DIAGNOSIS — G8929 Other chronic pain: Secondary | ICD-10-CM | POA: Diagnosis not present

## 2021-11-28 DIAGNOSIS — Z9689 Presence of other specified functional implants: Secondary | ICD-10-CM | POA: Diagnosis not present

## 2021-11-28 DIAGNOSIS — M5441 Lumbago with sciatica, right side: Secondary | ICD-10-CM | POA: Diagnosis not present

## 2021-11-28 DIAGNOSIS — M5416 Radiculopathy, lumbar region: Secondary | ICD-10-CM | POA: Diagnosis not present

## 2021-11-28 DIAGNOSIS — M961 Postlaminectomy syndrome, not elsewhere classified: Secondary | ICD-10-CM | POA: Diagnosis not present

## 2021-12-08 ENCOUNTER — Ambulatory Visit: Payer: Medicare HMO | Admitting: "Endocrinology

## 2021-12-15 ENCOUNTER — Ambulatory Visit (INDEPENDENT_AMBULATORY_CARE_PROVIDER_SITE_OTHER): Payer: Medicare HMO

## 2021-12-15 DIAGNOSIS — Z0000 Encounter for general adult medical examination without abnormal findings: Secondary | ICD-10-CM

## 2021-12-15 NOTE — Patient Instructions (Signed)
  Mr. Higinbotham , Thank you for taking time to come for your Medicare Wellness Visit. I appreciate your ongoing commitment to your health goals. Please review the following plan we discussed and let me know if I can assist you in the future.   These are the goals we discussed:  Goals      Patient Stated     Patient states that his goal is to loose weight.        This is a list of the screening recommended for you and due dates:  Health Maintenance  Topic Date Due   COVID-19 Vaccine (1) Never done   Zoster (Shingles) Vaccine (1 of 2) Never done   Colon Cancer Screening  Never done   Yearly kidney health urinalysis for diabetes  09/19/2014   Complete foot exam   06/14/2017   Flu Shot  08/19/2021   Hemoglobin A1C  02/26/2022   Eye exam for diabetics  03/18/2022   Yearly kidney function blood test for diabetes  10/23/2022   Medicare Annual Wellness Visit  12/16/2022   Hepatitis C Screening: USPSTF Recommendation to screen - Ages 18-79 yo.  Completed   HIV Screening  Completed   HPV Vaccine  Aged Out

## 2021-12-15 NOTE — Progress Notes (Signed)
Subjective:   Bryan Wright is a 66 y.o. male who presents for Medicare Annual/Subsequent preventive examination. I connected with  Bryan Wright on 12/15/21 by a audio enabled telemedicine application and verified that I am speaking with the correct person using two identifiers.  Patient Location: Home  Provider Location: Office/Clinic  I discussed the limitations of evaluation and management by telemedicine. The patient expressed understanding and agreed to proceed.  Review of Systems           Objective:    There were no vitals filed for this visit. There is no height or weight on file to calculate BMI.     12/09/2020    4:21 PM 07/21/2020    6:02 PM 12/06/2019    9:24 AM 04/15/2016    2:33 PM  Advanced Directives  Does Patient Have a Medical Advance Directive? No No No No  Would patient like information on creating a medical advance directive? No - Patient declined No - Patient declined No - Patient declined No - Patient declined    Current Medications (verified) Outpatient Encounter Medications as of 12/15/2021  Medication Sig   atorvastatin (LIPITOR) 10 MG tablet Take 1 tablet (10 mg total) by mouth at bedtime.   benazepril (LOTENSIN) 40 MG tablet Take 1 tablet (40 mg total) by mouth daily.   blood glucose meter kit and supplies 1 each by Other route as directed. Dispense based on patient and insurance preference. Use  four times daily as directed. (FOR ICD-10 E10.9, E11.9).   Blood Glucose Monitoring Suppl ([place]) w/Device KIT Use to test blood glucose twice a day-daily before breakfast and at bedtime.   cyclobenzaprine (FLEXERIL) 10 MG tablet Take 1 tablet (10 mg total) by mouth 2 (two) times daily as needed for muscle spasms.   fentaNYL (DURAGESIC - DOSED MCG/HR) 50 MCG/HR Place 50 mcg onto the skin every 3 (three) days.   glipiZIDE (GLUCOTROL XL) 10 MG 24 hr tablet TAKE 1 TABLET(10 MG) BY MOUTH DAILY WITH BREAKFAST   glucose blood  (ONETOUCH VERIO) test strip Use as instructed to test blood glucose 4 x a day-daily before meals  and at bedtime.   HYDROcodone-Acetaminophen 10-300 MG TABS to 2 tablets every 6 hours as needed Gets 365b tabs per 3 months, from pain clinic in [place] Nerve stimulator implanted in 2012 for chronic back pain with failed back surgery x 2   metFORMIN (GLUCOPHAGE) 1000 MG tablet TAKE 1 TABLET(1000 MG) BY MOUTH TWICE DAILY WITH A MEAL   Multiple Vitamin (THERA) TABS Take 2 tablets by mouth daily.   OneTouch Delica Lancets 94H MISC Use to test blood glucose twice a day-daily before breakfast and at bedtime.   sildenafil (REVATIO) 20 MG tablet DISSOLVE ONE TROCHE UNDERNEATH THE TONGUE 60 MINUTES PRIOR TO SEXUAL ENCOUNTER.   UNABLE TO FIND One touch meter and lancets Once daily testing dx e11.9   No facility-administered encounter medications on file as of 12/15/2021.    Allergies (verified) Patient has no known allergies.   History: Past Medical History:  Diagnosis Date   Diabetes mellitus dx in 2008   Hyperlipidemia    Hypertension 2008   Past Surgical History:  Procedure Laterality Date   APPENDECTOMY     early 20's   [place] SURGERY  2002   cervical  2002   SPINE SURGERY  2009, 2010   lumbar   No family  history on file. 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  Tobacco Use   Smoking status: Former    Packs/day: 1.50    Years: 5.00    Total pack years: 7.50    Types: Cigarettes    Quit date: 1983    Years since quitting: 40.9   Smokeless tobacco: Former    Types: Chew    Quit date: 06/18/2015  Vaping Use   Vaping Use: Never used  Substance and Sexual Activity   Alcohol use: No    Alcohol/week: 0.0 standard drinks  of alcohol   Drug use: No   Sexual activity: Yes    Birth control/protection: None  Other Topics Concern   Not on file  Social History Narrative   Lives with wife (in hospital currently rehab)      Two dogs: Precious and Francis of rabbits running loose      Son and two grandkids live behind you       Enjoys outside, visit friends,      Diet: not good, eats meats, fast food since wife has been sick   Caffeine: diet soda-usually caffeine free, unsweet tea   Water: 2-3 bottles daily       Wears seat belt   Smoke detectors at home   Does not use phone while driving    Social Determinants of Health   Financial Resource Strain: Low Risk  (12/09/2020)   Overall Financial Resource Strain (CARDIA)    Difficulty of Paying Living Expenses: Not hard at all  Food Insecurity: No Food Insecurity (12/09/2020)   Hunger Vital Sign    Worried About Running Out of Food in the Last Year: Never true    [place] in the Last Year: Never true  Transportation Needs: No Transportation Needs (12/09/2020)   PRAPARE - Hydrologist (Medical): No    Lack of Transportation (Non-Medical): No  Physical Activity: Insufficiently Active (12/09/2020)   Exercise Vital Sign    Days of Exercise per Week: 2 days    Minutes of Exercise per Session: 30 min  Stress: No Stress Concern Present (12/09/2020)   [place]    Feeling of Stress : Not at all  Social Connections: Moderately Isolated (12/09/2020)   Social Connection and Isolation Panel [NHANES]    Frequency of Communication with Friends and Family: More than three times a week    Frequency of Social Gatherings with Friends and Family: More than three times a week    Attends Religious Services: Never    Marine scientist or Organizations: No    Attends Music therapist: Never    Marital Status: Married    Tobacco  Counseling Counseling given: Not Answered   Clinical Intake:                 Diabetic?yes    Nutrition Risk Assessment:  Has the patient had any N/V/D within the last 2 months?  No  Does the patient have any non-healing wounds?  No  Has the patient had any unintentional weight loss or weight gain?  No   Diabetes:  Is the patient diabetic?  Yes  If diabetic, was a CBG obtained today?  No  Did the patient bring in their glucometer from home?  No  How often do you monitor your CBG's? daily.   Financial Strains and Diabetes Management:  Are you having any financial strains with the device, your supplies or your medication? No .  Does the patient want to be seen by Chronic Care Management for management of their diabetes?  No  Would the patient like to be referred to a Nutritionist or for Diabetic Management?  No   Diabetic Exams:  Diabetic Eye Exam: Completed 03/18/21 Diabetic Foot Exam: Overdue, Pt has been advised about the importance in completing this exam. Pt is scheduled for diabetic foot exam on  .      Activities of Daily Living     No data to display          Patient Care Team: Lindell Spar, MD as PCP - General (Internal Medicine) Rutherford Guys, MD as Consulting Physician (Ophthalmology) Kandace Blitz, MD as Referring Physician (Pain Medicine)  Indicate any recent Medical Services you may have received from other than [facility] providers in the past year (date may be approximate).     Assessment:   This is a routine wellness examination for Marlyn.  Hearing/Vision screen No results found.  Dietary issues and exercise activities discussed:     Goals Addressed   None    Depression Screen    07/14/2021   10:08 AM 05/28/2021    1:18 PM 12/09/2020    4:22 PM 12/09/2020    4:15 PM 09/10/2020    4:02 PM 08/16/2020    3:08 PM 12/06/2019    9:26 AM  PHQ 2/9 Scores  PHQ - 2 Score 0 0 0 0 0 0 0    Fall Risk    11/26/2021    4:03 PM  07/14/2021   10:08 AM 05/28/2021    1:18 PM 12/09/2020    4:22 PM 09/10/2020    4:01 PM  [place] in the past year? 0 0 0 0 0  Number falls in past yr: 0 0 0  0  Injury with Fall? 0 0 0 0 0  Risk for fall due to :  No Fall Risks   No Fall Risks  Follow up  Falls evaluation completed  Falls prevention discussed Falls evaluation completed    FALL RISK PREVENTION PERTAINING TO THE HOME:  Any stairs in or around the home? No  If so, are there any without handrails? No  Home free of loose throw rugs in walkways, pet beds, electrical cords, etc? Yes  Adequate lighting in your home to reduce risk of falls? Yes   ASSISTIVE DEVICES UTILIZED TO PREVENT FALLS:  Life alert? No  Use of a cane, walker or w/c? No  Grab bars in the bathroom? No  Shower chair or bench in shower? Yes  Elevated toilet seat or a handicapped toilet? Yes   TIMED UP AND GO:  Was the test performed? No .  Length of time to ambulate 10 feet:  sec.     Cognitive Function:        12/09/2020    4:24 PM 12/06/2019    9:29 AM 04/15/2016    2:34 PM  6CIT Screen  What Year? 0 points 0 points 0 points  What month? 0 points 0 points 0 points  What time? 0 points 0 points 0 points  Count back from 20 0 points 0 points  0 points  Months in reverse 0 points 0 points 0 points  Repeat phrase 0 points 0 points 0 points  Total Score 0 points 0 points 0 points    Immunizations Immunization History  Administered Date(s) Administered   Influenza Whole 12/02/2009   Influenza, Seasonal, Injecte, Preservative Fre 09/18/2013, 03/20/2015   Influenza,inj,Quad PF,6+ Mos 09/18/2013, 03/20/2015, 11/25/2016, 10/19/2017, 10/13/2018, 11/21/2019   PNEUMOCOCCAL CONJUGATE-20 09/10/2020   Pneumococcal Polysaccharide-23 12/02/2009, 03/20/2015    TDAP status: Up to date  Flu Vaccine status: Due, Education has been provided regarding the importance of this vaccine. Advised may receive this vaccine at local pharmacy or Health  Dept. Aware to provide a copy of the vaccination record if obtained from local pharmacy or Health Dept. Verbalized acceptance and understanding.  Pneumococcal vaccine status: Up to date  Covid-19 vaccine status: Information provided on how to obtain vaccines.   Qualifies for Shingles Vaccine? Yes   Zostavax completed No   Shingrix Completed?: No.    Education has been provided regarding the importance of this vaccine. Patient has been advised to call insurance company to determine out of pocket expense if they have not yet received this vaccine. Advised may also receive vaccine at local pharmacy or Health Dept. Verbalized acceptance and understanding.  Screening Tests Health Maintenance  Topic Date Due   COVID-19 Vaccine (1) Never done   Zoster Vaccines- Shingrix (1 of 2) Never done   COLONOSCOPY (Pts 45-40yr Insurance coverage will need to be confirmed)  Never done   Diabetic kidney evaluation - Urine ACR  09/19/2014   FOOT EXAM  06/14/2017   INFLUENZA VACCINE  08/19/2021   Medicare Annual Wellness (AWV)  12/09/2021   HEMOGLOBIN A1C  02/26/2022   OPHTHALMOLOGY EXAM  03/18/2022   Diabetic kidney evaluation - GFR measurement  10/23/2022   Hepatitis C Screening  Completed   HIV Screening  Completed   HPV VACCINES  Aged Out    Health Maintenance  Health Maintenance Due  Topic Date Due   COVID-19 Vaccine (1) Never done   Zoster Vaccines- Shingrix (1 of 2) Never done   COLONOSCOPY (Pts 45-441yrInsurance coverage will need to be confirmed)  Never done   Diabetic kidney evaluation - Urine ACR  09/19/2014   FOOT EXAM  06/14/2017   INFLUENZA VACCINE  08/19/2021   Medicare Annual Wellness (AWV)  12/09/2021      Lung Cancer Screening: (Low Dose CT Chest recommended if Age 64-80ears, 30 pack-year currently smoking OR have quit w/in 15years.) does not qualify.   Lung Cancer Screening Referral:   Additional Screening:  Hepatitis C Screening: does not qualify; Completed  03/20/15  Vision Screening: Recommended annual ophthalmology exams for early detection of glaucoma and other disorders of the eye. Is the patient up to date with their annual eye exam?  Yes  Who is the provider or what is the name of the office in which the patient attends annual eye exams? [facility] If pt is not established with a provider, would they like to be referred to a provider to establish care? No .   Dental Screening: Recommended annual dental exams for proper oral hygiene  Community Resource Referral / Chronic Care Management: CRR required this visit?  No   CCM required this visit?  No      Plan:     I have personally reviewed and noted the following in the patient's chart:   Medical and social history Use of alcohol, tobacco or illicit drugs  Current medications and supplements including opioid prescriptions. Patient is not currently taking opioid prescriptions. Functional ability and status Nutritional status Physical activity Advanced directives List of other physicians Hospitalizations, surgeries, and ER visits in previous 12 months Vitals Screenings to include cognitive, depression, and falls Referrals and appointments  In addition, I have reviewed and discussed with patient certain preventive protocols, quality metrics, and best practice recommendations. A written personalized care plan for preventive services as well as general preventive health recommendations were provided to patient.     Jill Side, [place]   12/15/2021   Nurse Notes:

## 2021-12-26 ENCOUNTER — Encounter: Payer: Self-pay | Admitting: "Endocrinology

## 2021-12-26 ENCOUNTER — Ambulatory Visit: Payer: Medicare HMO | Admitting: "Endocrinology

## 2021-12-26 VITALS — BP 158/68 | HR 72 | Ht 70.5 in | Wt 255.4 lb

## 2021-12-26 DIAGNOSIS — E1165 Type 2 diabetes mellitus with hyperglycemia: Secondary | ICD-10-CM

## 2021-12-26 DIAGNOSIS — Z6834 Body mass index (BMI) 34.0-34.9, adult: Secondary | ICD-10-CM | POA: Diagnosis not present

## 2021-12-26 DIAGNOSIS — I10 Essential (primary) hypertension: Secondary | ICD-10-CM

## 2021-12-26 DIAGNOSIS — E782 Mixed hyperlipidemia: Secondary | ICD-10-CM | POA: Diagnosis not present

## 2021-12-26 DIAGNOSIS — E66811 Obesity, class 1: Secondary | ICD-10-CM

## 2021-12-26 DIAGNOSIS — E6609 Other obesity due to excess calories: Secondary | ICD-10-CM | POA: Diagnosis not present

## 2021-12-26 LAB — POCT GLYCOSYLATED HEMOGLOBIN (HGB A1C): HbA1c, POC (controlled diabetic range): 7.7 % — AB (ref 0.0–7.0)

## 2021-12-26 NOTE — Patient Instructions (Signed)

## 2021-12-26 NOTE — Progress Notes (Signed)
12/26/2021, 8:48 AM   Endocrinology follow-up note  Subjective:    Patient ID: Bryan Wright, male    DOB: September 30, 1955.  Bryan Wright is being seen in consultation for management of currently uncontrolled symptomatic diabetes requested by  Lindell Spar, MD.   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  Tobacco Use   Smoking status: Former    Packs/day: 1.50    Years: 5.00    Total pack years: 7.50    Types: Cigarettes    Quit date: 1983    Years since quitting: 40.9   Smokeless tobacco: Former    Types: Chew    Quit date: 06/18/2015  Vaping Use   Vaping Use: Never used  Substance and Sexual Activity   Alcohol use: No    Alcohol/week: 0.0 standard drinks of alcohol   Drug use: No   Sexual activity: Yes    Birth control/protection: None  Other Topics Concern   Not on file  Social History Narrative   Lives with wife (in hospital currently rehab)      Two dogs: Precious and Palmer of rabbits running loose      Son and two grandkids live behind you       Enjoys outside, visit friends,      Diet: not good, eats meats, fast food since wife has been sick   Caffeine: diet soda-usually caffeine free, unsweet tea   Water: 2-3 bottles daily       Wears seat belt   Smoke detectors at home   Does not use phone while driving    Social  Determinants of Health   Financial Resource Strain: Low Risk  (12/15/2021)   Overall Financial Resource Strain (CARDIA)    Difficulty of Paying Living Expenses: Not hard at all  Food Insecurity: No Food Insecurity (12/15/2021)   Hunger Vital Sign    Worried About Running Out of Food in the Last Year: Never true    [place] in the Last Year: Never true  Transportation Needs: No Transportation Needs (12/15/2021)   PRAPARE - Hydrologist (Medical): No    Lack of Transportation (Non-Medical): No  Physical Activity: Insufficiently Active (12/09/2020)   Exercise Vital Sign    Days of Exercise per Week: 2 days    Minutes of Exercise per Session: 30 min  Stress: No Stress Concern Present (12/15/2021)  [employer] - Occupational Stress Questionnaire    Feeling of Stress : Not at all  Social Connections: Moderately Isolated (12/15/2021)   Social Connection and Isolation Panel [NHANES]    Frequency of Communication with Friends and Family: More than three times a week    Frequency of Social Gatherings with Friends and Family: More than three times a week    Attends Religious Services: Never    Marine scientist or Organizations: No    Attends Archivist Meetings: Never    Marital Status: Married    History reviewed. No pertinent family history.  Outpatient Encounter Medications as of 12/26/2021  Medication Sig   atorvastatin (LIPITOR) 10 MG tablet Take 1 tablet (10 mg total) by mouth at bedtime.   benazepril (LOTENSIN) 40 MG tablet Take 1 tablet (40 mg total) by mouth daily.   blood glucose meter kit and supplies 1 each by Other route as directed. Dispense based on patient and insurance preference. Use  four times daily as directed. (FOR ICD-10 E10.9, E11.9).   Blood Glucose Monitoring Suppl ([place]kin) w/Device KIT Use to test blood glucose twice a day-daily before breakfast and at bedtime.    cyclobenzaprine (FLEXERIL) 10 MG tablet Take 1 tablet (10 mg total) by mouth 2 (two) times daily as needed for muscle spasms.   fentaNYL (DURAGESIC - DOSED MCG/HR) 50 MCG/HR Place 50 mcg onto the skin every 3 (three) days.   glipiZIDE (GLUCOTROL XL) 10 MG 24 hr tablet TAKE 1 TABLET(10 MG) BY MOUTH DAILY WITH BREAKFAST   glucose blood (ONETOUCH VERIO) test strip Use as instructed to test blood glucose 4 x a day-daily before meals  and at bedtime.   HYDROcodone-Acetaminophen 10-300 MG TABS to 2 tablets every 6 hours as needed Gets 365b tabs per 3 months, from pain clinic in [place] Nerve stimulator implanted in 2012 for chronic back pain with failed back surgery x 2   metFORMIN (GLUCOPHAGE) 1000 MG tablet TAKE 1 TABLET(1000 MG) BY MOUTH TWICE DAILY WITH A MEAL   Multiple Vitamin (THERA) TABS Take 2 tablets by mouth daily.   OneTouch Delica Lancets 97F MISC Use to test blood glucose twice a day-daily before breakfast and at bedtime.   sildenafil (REVATIO) 20 MG tablet DISSOLVE ONE TROCHE UNDERNEATH THE TONGUE 60 MINUTES PRIOR TO SEXUAL ENCOUNTER.   UNABLE TO FIND One touch meter and lancets Once daily testing dx e11.9   No facility-administered encounter medications on file as of 12/26/2021.    ALLERGIES: No Known Allergies  VACCINATION STATUS: Immunization History  Administered Date(s) Administered   Influenza Whole 12/02/2009   Influenza, Seasonal, Injecte, Preservative Fre 09/18/2013, 03/20/2015   Influenza,inj,Quad PF,6+ Mos 09/18/2013, 03/20/2015, 11/25/2016, 10/19/2017, 10/13/2018, 11/21/2019   PNEUMOCOCCAL CONJUGATE-20 09/10/2020   Pneumococcal Polysaccharide-23 12/02/2009, 03/20/2015    Diabetes He presents for his follow-up diabetic visit. He has type 2 diabetes mellitus. Onset time: He was diagnosed at approximate age of 24 years. His disease course has been stable. There are no hypoglycemic associated symptoms. Pertinent negatives for hypoglycemia include no confusion,  headaches, pallor or seizures. Pertinent negatives for diabetes include no chest pain, no fatigue, no polydipsia, no polyphagia, no polyuria and no weakness. There are no hypoglycemic complications. Symptoms are stable. There are no diabetic complications. Risk factors for coronary artery disease include diabetes mellitus, dyslipidemia, family history, hypertension, male sex, obesity and sedentary lifestyle. Current diabetic treatments: He is currently on metformin 1000 mg p.o. twice daily, glipizide  10 mg p.o. daily at breakfast. His weight is increasing steadily. He is following a generally unhealthy diet. When asked about meal planning, he reported none. He has not had a previous visit with a dietitian. He rarely participates in exercise. His home blood glucose trend is decreasing steadily. His breakfast blood glucose range is generally 140-180 mg/dl. His bedtime blood glucose range is generally 180-200 mg/dl. His overall blood glucose range is 180-200 mg/dl. (He presents with near target glycemic profile and point-of-care A1c was 7.7%, unchanged from his last visit A1c, overall improving from 9.9%.  He did not document any hypoglycemia.    ) An ACE inhibitor/angiotensin II receptor blocker is being taken.  Hyperlipidemia This is a chronic problem. The current episode started more than 1 year ago. The problem is controlled. Pertinent negatives include no chest pain, myalgias or shortness of breath. Current antihyperlipidemic treatment includes statins. Risk factors for coronary artery disease include diabetes mellitus, dyslipidemia, family history, hypertension, male sex, a sedentary lifestyle and obesity.     Review of Systems  Constitutional:  Negative for chills, fatigue, fever and unexpected weight change.  HENT:  Negative for dental problem, mouth sores and trouble swallowing.   Eyes:  Negative for visual disturbance.  Respiratory:  Negative for cough, choking, chest tightness, shortness of  breath and wheezing.   Cardiovascular:  Negative for chest pain, palpitations and leg swelling.  Gastrointestinal:  Negative for abdominal distention, abdominal pain, constipation, diarrhea, nausea and vomiting.  Endocrine: Negative for polydipsia, polyphagia and polyuria.  Genitourinary:  Negative for dysuria, flank pain, hematuria and urgency.  Musculoskeletal:  Negative for back pain, gait problem, myalgias and neck pain.  Skin:  Negative for pallor, rash and wound.  Neurological:  Negative for seizures, syncope, weakness, numbness and headaches.  Psychiatric/Behavioral:  Negative for confusion and dysphoric mood.     Objective:       12/26/2021    8:27 AM 11/26/2021    4:02 PM 08/26/2021    2:15 PM  Vitals with BMI  Height 5' 10.5" 5' 10.5" 5' 10.5"  Weight 255 lbs 6 oz 254 lbs 246 lbs 3 oz  BMI 36.12 85.63 14.97  Systolic 026 378 588  Diastolic 68 71 62  Pulse 72 80 72    BP (!) 158/68   Pulse 72   Ht 5' 10.5" (1.791 m)   Wt 255 lb 6.4 oz (115.8 kg)   BMI 36.13 kg/m   Wt Readings from Last 3 Encounters:  12/26/21 255 lb 6.4 oz (115.8 kg)  11/26/21 254 lb (115.2 kg)  08/26/21 246 lb 3.2 oz (111.7 kg)      CMP ( most recent) CMP     Component Value Date/Time   NA 140 10/22/2021 1045   K 4.5 10/22/2021 1045   CL 103 10/22/2021 1045   CO2 22 10/22/2021 1045   GLUCOSE 154 (H) 10/22/2021 1045   GLUCOSE 269 (H) 07/21/2020 1901   BUN 20 10/22/2021 1045   CREATININE 0.99 10/22/2021 1045   CREATININE 0.82 06/11/2016 1448   CALCIUM 9.4 10/22/2021 1045   PROT 7.0 10/22/2021 1045   ALBUMIN 4.7 10/22/2021 1045   AST 24 10/22/2021 1045   ALT 16 10/22/2021 1045   ALKPHOS 66 10/22/2021 1045   BILITOT 0.6 10/22/2021 1045   GFRNONAA >60 07/21/2020 1752   GFRNONAA >89 06/11/2016 1448   GFRAA >89 06/11/2016 1448     Diabetic Labs (most recent): Lab Results  Component Value Date   HGBA1C 7.7 (A)  12/26/2021   HGBA1C 7.7 (A) 08/26/2021   HGBA1C 9.9 (A) 05/20/2021    MICROALBUR 1.23 09/18/2013   MICROALBUR 1.43 08/05/2012   MICROALBUR 2.83 (H) 12/02/2009     Lipid Panel ( most recent) Lipid Panel     Component Value Date/Time   CHOL 122 10/22/2021 1045   TRIG 63 10/22/2021 1045   HDL 45 10/22/2021 1045   CHOLHDL 2.7 10/22/2021 1045   CHOLHDL 4.5 12/24/2015 1525   VLDL 19 12/24/2015 1525   LDLCALC 64 10/22/2021 1045   LABVLDL 13 10/22/2021 1045      Lab Results  Component Value Date   TSH 0.432 (L) 10/22/2021   TSH 0.414 (L) 08/16/2020   TSH 1.17 06/11/2016   TSH 0.72 03/20/2015   TSH 1.234 02/28/2014   TSH 0.634 08/05/2012   FREET4 1.39 10/22/2021   FREET4 1.28 08/16/2020     Assessment & Plan:   1. Type 2 diabetes mellitus with hyperglycemia, without long-term current use of insulin (HCC)   - ALDINE GRAINGER has currently uncontrolled symptomatic type 2 DM since  66 years of age.  He presents with near target glycemic profile and point-of-care A1c was 7.7%, unchanged from his last visit A1c, overall improving from 9.9%.  He did not document any hypoglycemia.  Recent labs reviewed. - I had a long discussion with him about the progressive nature of diabetes and the pathology behind its complications. -his diabetes is complicated by obesity/sedentary life and he remains at a high risk for more acute and chronic complications which include CAD, CVA, CKD, retinopathy, and neuropathy. These are all discussed in detail with him.  - I have counseled him on diet  and weight management  by adopting a carbohydrate restricted/protein rich diet. Patient is encouraged to switch to  unprocessed or minimally processed     complex starch and increased protein intake (animal or plant source), fruits, and vegetables. -  he is advised to stick to a routine mealtimes to eat 3 meals  a day and avoid unnecessary snacks ( to snack only to correct hypoglycemia).   He admits to dietary indiscretions since last visit, however, wishes to resume his previous  lifestyle which gave him great results.  - he acknowledges that there is a room for improvement in his food and drink choices. - Suggestion is made for him to avoid simple carbohydrates  from his diet including Cakes, Sweet Desserts, Ice Cream, Soda (diet and regular), Sweet Tea, Candies, Chips, Cookies, Store Bought Juices, Alcohol , Artificial Sweeteners,  Coffee Creamer, and "Sugar-free" Products, Lemonade. This will help patient to have more stable blood glucose profile and potentially avoid unintended weight gain.  The following Lifestyle Medicine recommendations according to [facility]) were discussed and and offered to patient and he  agrees to start the journey:  A. Whole Foods, Plant-Based Nutrition comprising of fruits and vegetables, plant-based proteins, whole-grain carbohydrates was discussed in detail with the patient.   A list for source of those nutrients were also provided to the patient.  Patient will use only water or unsweetened tea for hydration. B.  The need to stay away from risky substances including alcohol, smoking; obtaining 7 to 9 hours of restorative sleep, at least 150 minutes of moderate intensity exercise weekly, the importance of healthy social connections,  and stress management techniques were discussed. C.  A full color page of  Calorie density of various food groups per pound showing examples of each  food groups was provided to the patient.     - he has been  scheduled with Jearld Fenton, RDN, CDE for diabetes education.  - I have approached him with the following individualized plan to manage  his diabetes and patient agrees:   -In light of his presentation with above target A1c of 7.7%, he was offered addition of GLP-1 receptor agonist or basal insulin, however he wishes to avoid these interventions for now.  He is advised to continue metformin 1000 mg p.o. twice daily, glipizide 10 mg XL p.o. daily at breakfast.     -He  is encouraged and advised to monitor blood glucose twice a day-daily before breakfast and at bedtime.    - he is encouraged to call clinic for blood glucose levels less than 70 or above 200 mg per DL  at fasting.  - he will be considered for incretin therapy or basal insulin as appropriate next visit.  - Specific targets for  A1c;  LDL, HDL,  and Triglycerides were discussed with the patient.  2) Blood Pressure /Hypertension:   -His blood pressure is controlled to near target.  he is advised to continue his current medications including benazepril 40 mg p.o. daily with breakfast .  3) Lipids/Hyperlipidemia:   Review of his recent lipid panel showed improved LDL at 63 from 97.  He is advised to continue atorvastatin 10 mg p.o. daily at bedtime.     4)  Weight/Diet:  Body mass index is 36.13 kg/m.  -   clearly complicating his diabetes care.   he is  a candidate for weight loss. I discussed with him the fact that loss of 5 - 10% of his  current body weight will have the most impact on his diabetes management.  Exercise, and detailed carbohydrates information provided  -  detailed on discharge instructions.  5) Chronic Care/Health Maintenance:  -he  is on ACEI/ARB and Statin medications and  is encouraged to initiate and continue to follow up with Ophthalmology, Dentist,  Podiatrist at least yearly or according to recommendations, and advised to   stay away from smoking. I have recommended yearly flu vaccine and pneumonia vaccine at least every 5 years; moderate intensity exercise for up to 150 minutes weekly; and  sleep for at least 7 hours a day.  6) subclinical hyperthyroidism-TSH slightly suppressed at 0.41.  His thyroid hormones are normal.  He will not need any antithyroid intervention at this time.  He will need repeat full profile thyroid function test on subsequent visits.      No clinical goiter, no need for thyroid imaging for now.   - he is  advised to maintain close follow up with  Lindell Spar, MD for primary care needs, as well as his other providers for optimal and coordinated care.   I spent 28 minutes in the care of the patient today including review of labs from [facility], Lipids, Thyroid Function, Hematology (current and previous including abstractions from other facilities); face-to-face time discussing  his blood glucose readings/logs, discussing hypoglycemia and hyperglycemia episodes and symptoms, medications doses, his options of short and long term treatment based on the latest standards of care / guidelines;  discussion about incorporating lifestyle medicine;  and documenting the encounter. Risk reduction counseling performed per USPSTF guidelines to reduce  obesity and cardiovascular risk factors.     Please refer to Patient Instructions for Blood Glucose Monitoring and Insulin/Medications Dosing Guide"  in media tab for additional information. Please  also  refer to " Patient Self Inventory" in the Media  tab for reviewed elements of pertinent patient history.  Adonis Huguenin participated in the discussions, expressed understanding, and voiced agreement with the above plans.  All questions were answered to his satisfaction. he is encouraged to contact clinic should he have any questions or concerns prior to his return visit.   Follow up plan: - Return for Bring Meter/CGM Device/Logs- A1c in Office.  Glade Lloyd, MD [facility] [address] [place], [place] [address] Phone: [phone]  Fax: [phone]    12/26/2021, 8:48 AM  This note was partially dictated with voice recognition software. Similar sounding words can be transcribed inadequately or may not  be corrected upon review.

## 2021-12-29 ENCOUNTER — Other Ambulatory Visit: Payer: Self-pay | Admitting: "Endocrinology

## 2021-12-29 DIAGNOSIS — Z91199 Patient's noncompliance with other medical treatment and regimen due to unspecified reason: Secondary | ICD-10-CM

## 2022-01-05 ENCOUNTER — Other Ambulatory Visit: Payer: Self-pay

## 2022-01-05 DIAGNOSIS — E785 Hyperlipidemia, unspecified: Secondary | ICD-10-CM

## 2022-01-05 MED ORDER — ATORVASTATIN CALCIUM 10 MG PO TABS: 10.0000 mg | ORAL_TABLET | Freq: Every day | ORAL | 0 refills | Status: DC
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
## 2022-01-08 ENCOUNTER — Encounter (HOSPITAL_COMMUNITY): Payer: Self-pay

## 2022-01-08 ENCOUNTER — Other Ambulatory Visit: Payer: Self-pay

## 2022-01-08 DIAGNOSIS — H2512 Age-related nuclear cataract, left eye: Secondary | ICD-10-CM | POA: Diagnosis not present

## 2022-01-09 ENCOUNTER — Encounter (HOSPITAL_COMMUNITY)
Admission: RE | Admit: 2022-01-09 | Discharge: 2022-01-09 | Disposition: A | Discharge status: Home / Self Care | Payer: Medicare HMO | Source: Ambulatory Visit | Attending: Optometry | Admitting: Optometry

## 2022-01-15 ENCOUNTER — Other Ambulatory Visit: Payer: Self-pay

## 2022-01-15 ENCOUNTER — Ambulatory Visit (HOSPITAL_BASED_OUTPATIENT_CLINIC_OR_DEPARTMENT_OTHER): Payer: Medicare HMO | Admitting: Anesthesiology

## 2022-01-15 ENCOUNTER — Encounter (HOSPITAL_COMMUNITY)
Admission: RE | Disposition: A | Discharge status: Home / Self Care | Payer: Self-pay | Source: Ambulatory Visit | Attending: Optometry

## 2022-01-15 ENCOUNTER — Encounter (HOSPITAL_COMMUNITY): Payer: Self-pay | Admitting: Optometry

## 2022-01-15 ENCOUNTER — Ambulatory Visit (HOSPITAL_COMMUNITY)
Admission: RE | Admit: 2022-01-15 | Discharge: 2022-01-15 | Disposition: A | Discharge status: Home / Self Care | Payer: Medicare HMO | Source: Ambulatory Visit | Attending: Optometry | Admitting: Optometry

## 2022-01-15 ENCOUNTER — Ambulatory Visit (HOSPITAL_COMMUNITY): Payer: Medicare HMO | Admitting: Anesthesiology

## 2022-01-15 DIAGNOSIS — E1136 Type 2 diabetes mellitus with diabetic cataract: Secondary | ICD-10-CM | POA: Insufficient documentation

## 2022-01-15 DIAGNOSIS — E113393 Type 2 diabetes mellitus with moderate nonproliferative diabetic retinopathy without macular edema, bilateral: Secondary | ICD-10-CM | POA: Insufficient documentation

## 2022-01-15 DIAGNOSIS — H25812 Combined forms of age-related cataract, left eye: Secondary | ICD-10-CM

## 2022-01-15 DIAGNOSIS — I10 Essential (primary) hypertension: Secondary | ICD-10-CM

## 2022-01-15 DIAGNOSIS — Z87891 Personal history of nicotine dependence: Secondary | ICD-10-CM

## 2022-01-15 DIAGNOSIS — H2513 Age-related nuclear cataract, bilateral: Secondary | ICD-10-CM | POA: Diagnosis not present

## 2022-01-15 DIAGNOSIS — H5203 Hypermetropia, bilateral: Secondary | ICD-10-CM | POA: Diagnosis not present

## 2022-01-15 DIAGNOSIS — H2512 Age-related nuclear cataract, left eye: Secondary | ICD-10-CM

## 2022-01-15 DIAGNOSIS — H52221 Regular astigmatism, right eye: Secondary | ICD-10-CM | POA: Insufficient documentation

## 2022-01-15 DIAGNOSIS — E119 Type 2 diabetes mellitus without complications: Secondary | ICD-10-CM

## 2022-01-15 DIAGNOSIS — Z7984 Long term (current) use of oral hypoglycemic drugs: Secondary | ICD-10-CM

## 2022-01-15 HISTORY — PX: CATARACT EXTRACTION W/PHACO: SHX586

## 2022-01-15 LAB — GLUCOSE, CAPILLARY: Glucose-Capillary: 180 mg/dL — ABNORMAL HIGH (ref 70–99)

## 2022-01-15 SURGERY — PHACOEMULSIFICATION, CATARACT, WITH IOL INSERTION
Anesthesia: Monitor Anesthesia Care | Laterality: Left

## 2022-01-15 MED ORDER — TROPICAMIDE 1 % OP SOLN
1.0000 [drp] | OPHTHALMIC | Status: AC | PRN
  Administered 2022-01-15 (×3): 1 [drp] via OPHTHALMIC

## 2022-01-15 MED ORDER — MOXIFLOXACIN HCL 0.5 % OP SOLN
OPHTHALMIC | Status: DC | PRN
  Administered 2022-01-15: .2 mL via OPHTHALMIC

## 2022-01-15 MED ORDER — SODIUM CHLORIDE 0.9% FLUSH
INTRAVENOUS | Status: DC | PRN
  Administered 2022-01-15: 5 mL via INTRAVENOUS

## 2022-01-15 MED ORDER — LIDOCAINE HCL 3.5 % OP GEL
1.0000 | Freq: Once | OPHTHALMIC | Status: AC
  Administered 2022-01-15: 1 via OPHTHALMIC

## 2022-01-15 MED ORDER — PHENYLEPHRINE HCL 2.5 % OP SOLN
1.0000 [drp] | OPHTHALMIC | Status: AC | PRN
  Administered 2022-01-15 (×3): 1 [drp] via OPHTHALMIC

## 2022-01-15 MED ORDER — PHENYLEPHRINE-KETOROLAC 1-0.3 % IO SOLN
INTRAOCULAR | Status: DC | PRN
  Administered 2022-01-15: 500 mL via OPHTHALMIC

## 2022-01-15 MED ORDER — MOXIFLOXACIN HCL 5 MG/ML IO SOLN
INTRAOCULAR | Status: AC
  Filled 2022-01-15: qty 1

## 2022-01-15 MED ORDER — BSS IO SOLN
INTRAOCULAR | Status: DC | PRN
  Administered 2022-01-15: 15 mL via INTRAOCULAR

## 2022-01-15 MED ORDER — TETRACAINE HCL 0.5 % OP SOLN
1.0000 [drp] | OPHTHALMIC | Status: AC | PRN
  Administered 2022-01-15 (×3): 1 [drp] via OPHTHALMIC

## 2022-01-15 MED ORDER — POVIDONE-IODINE 5 % OP SOLN
OPHTHALMIC | Status: DC | PRN
  Administered 2022-01-15: 1 via OPHTHALMIC

## 2022-01-15 MED ORDER — MIDAZOLAM HCL 2 MG/2ML IJ SOLN
INTRAMUSCULAR | Status: AC
  Filled 2022-01-15: qty 2

## 2022-01-15 MED ORDER — MIDAZOLAM HCL 5 MG/5ML IJ SOLN
INTRAMUSCULAR | Status: DC | PRN
  Administered 2022-01-15: 2 mg via INTRAVENOUS

## 2022-01-15 MED ORDER — STERILE WATER FOR IRRIGATION IR SOLN
Status: DC | PRN
  Administered 2022-01-15: 250 mL

## 2022-01-15 MED ORDER — SIGHTPATH DOSE#1 NA HYALUR & NA CHOND-NA HYALUR IO KIT
PACK | INTRAOCULAR | Status: DC | PRN
  Administered 2022-01-15: 1 via OPHTHALMIC

## 2022-01-15 MED ORDER — LIDOCAINE HCL (PF) 1 % IJ SOLN
INTRAMUSCULAR | Status: DC | PRN
  Administered 2022-01-15: 1 mL

## 2022-01-15 SURGICAL SUPPLY — 16 items
CATARACT SUITE SIGHTPATH (MISCELLANEOUS) ×1 IMPLANT
CLOTH BEACON ORANGE TIMEOUT ST (SAFETY) ×1 IMPLANT
DRSG TEGADERM 4X4.75 (GAUZE/BANDAGES/DRESSINGS) ×1 IMPLANT
EYE SHIELD UNIVERSAL CLEAR (GAUZE/BANDAGES/DRESSINGS) IMPLANT
FEE CATARACT SUITE SIGHTPATH (MISCELLANEOUS) ×1 IMPLANT
GLOVE BIOGEL PI IND STRL 7.0 (GLOVE) ×2 IMPLANT
LENS IOL RAYNER 23.0 (Intraocular Lens) ×1 IMPLANT
LENS IOL RAYONE EMV 23.0 (Intraocular Lens) IMPLANT
NDL HYPO 18GX1.5 BLUNT FILL (NEEDLE) ×1 IMPLANT
NEEDLE HYPO 18GX1.5 BLUNT FILL (NEEDLE) ×1 IMPLANT
PAD ARMBOARD 7.5X6 YLW CONV (MISCELLANEOUS) ×1 IMPLANT
RING MALYGIN 7.0 (MISCELLANEOUS) IMPLANT
SYR TB 1ML LL NO SAFETY (SYRINGE) ×1 IMPLANT
TAPE SURG TRANSPORE 1 IN (GAUZE/BANDAGES/DRESSINGS) IMPLANT
TAPE SURGICAL TRANSPORE 1 IN (GAUZE/BANDAGES/DRESSINGS) ×1
WATER STERILE IRR 250ML POUR (IV SOLUTION) ×1 IMPLANT

## 2022-01-15 NOTE — Anesthesia Postprocedure Evaluation (Signed)
Anesthesia Post Note  Patient: Bryan Wright  Procedure(s) Performed: CATARACT EXTRACTION PHACO AND INTRAOCULAR LENS PLACEMENT (IOC) (Left)  Patient location during evaluation: Short Stay Anesthesia Type: MAC Level of consciousness: awake Pain management: pain level controlled Vital Signs Assessment: post-procedure vital signs reviewed and stable Respiratory status: spontaneous breathing Cardiovascular status: blood pressure returned to baseline and stable Postop Assessment: no apparent nausea or vomiting Anesthetic complications: no   No notable events documented.   Last Vitals:  Vitals:   01/15/22 0746  BP: (!) 152/80  Pulse: 73  Resp: 16  Temp: 36.7 C  SpO2: 94%    Last Pain:  Vitals:   01/15/22 0746  TempSrc: Oral  PainSc: 0-No pain                 Zamariyah Furukawa

## 2022-01-15 NOTE — Discharge Instructions (Addendum)
Please discharge patient when stable, will follow up today with Dr. Snyder at the [facility] office immediately following discharge.  Leave shield in place until visit.  All paperwork with discharge instructions will be given at the office.  [facility] Address:  [address]  [place], [place] [address]  Dr. Snyder's Phone: [phone]  

## 2022-01-15 NOTE — Op Note (Signed)
Date of procedure: 01/15/22  Pre-operative diagnosis: Visually significant age-related nuclear cataract, Left Eye (H25.12)  Post-operative diagnosis: Visually significant age-related nuclear cataract, Left Eye  Procedure: Removal of cataract via phacoemulsification and insertion of intra-ocular lens Rayner [id number] +23.0D into the capsular bag of the Left Eye  Attending surgeon: Ronn Melena, MD  Anesthesia: MAC, Topical Akten  Complications: None  Estimated Blood Loss: <58m (minimal)  Specimens: None  Implants:  Implant Name Type Inv. Item Serial No. Manufacturer Lot No. LRB No. Used Action  LENS IOL RAYNER 23.0 - S62 Intraocular Lens LENS IOL RAYNER 23.0 62 SIGHTPATH [id number] Implanted    Indications:  Visually significant age-related cataract, Left Eye  Procedure:  The patient was seen and identified in the pre-operative area. The operative eye was identified and dilated.  The operative eye was marked.  Topical anesthesia was administered to the operative eye.     The patient was then to the operative suite and placed in the supine position.  A timeout was performed confirming the patient, procedure to be performed, and all other relevant information.   The patient's face was prepped and draped in the usual fashion for intra-ocular surgery.  A lid speculum was placed into the operative eye and the surgical microscope moved into place and focused.  An inferotemporal paracentesis was created using a 20 gauge paracentesis blade.  BSS mixed with Omidria, followed by 1% lidocaine was injected into the anterior chamber.  Viscoelastic was injected into the anterior chamber.  A temporal clear-corneal main wound incision was created using a 2.422mmicrokeratome.  A continuous curvilinear capsulorrhexis was initiated using an irrigating cystitome and completed using capsulorrhexis forceps.  Hydrodissection and hydrodeliniation were performed.  Viscoelastic was injected into the  anterior chamber.  A phacoemulsification handpiece and a chopper as a second instrument were used to remove the nucleus and epinucleus. The irrigation/aspiration handpiece was used to remove any remaining cortical material.   The capsular bag was reinflated with viscoelastic, checked, and found to be intact.  The intraocular lens was inserted into the capsular bag.  The irrigation/aspiration handpiece was used to remove any remaining viscoelastic.  The clear corneal wound and paracentesis wounds were then hydrated and checked with Weck-Cels to be watertight.  The lid-speculum and drape was removed, and the patient's face was cleaned with a wet and dry 4x4.  Moxifloxacin was instilled onto the eye. A clear shield was taped over the eye. The patient was taken to the post-operative care unit in good condition, having tolerated the procedure well.  Post-Op Instructions: [facility] and will receive all other orders and instructions.

## 2022-01-15 NOTE — Transfer of Care (Signed)
Immediate Anesthesia Transfer of Care Note  Patient: Bryan Wright  Procedure(s) Performed: CATARACT EXTRACTION PHACO AND INTRAOCULAR LENS PLACEMENT (IOC) (Left)  Patient Location: Short Stay  Anesthesia Type:MAC  Level of Consciousness: awake  Airway & Oxygen Therapy: Patient Spontanous Breathing  Post-op Assessment: Report given to RN  Post vital signs: Reviewed and stable  Last Vitals:  Vitals Value Taken Time  BP    Temp    Pulse    Resp    SpO2      Last Pain:  Vitals:   01/15/22 0746  TempSrc: Oral  PainSc: 0-No pain         Complications: No notable events documented.

## 2022-01-15 NOTE — Anesthesia Preprocedure Evaluation (Addendum)
Anesthesia Evaluation  Patient identified by MRN, date of birth, ID band Patient awake    Reviewed: Allergy & Precautions, H&P , NPO status , Patient's Chart, lab work & pertinent test results  Airway Mallampati: II  TM Distance: >3 FB Neck ROM: Full    Dental  (+) Dental Advisory Given, Missing   Pulmonary former smoker   Pulmonary exam normal breath sounds clear to auscultation       Cardiovascular hypertension, Pt. on medications Normal cardiovascular exam Rhythm:Regular Rate:Normal     Neuro/Psych negative neurological ROS  negative psych ROS   GI/Hepatic negative GI ROS, Neg liver ROS,,,  Endo/Other  diabetes, Well Controlled, Type 2, Oral Hypoglycemic Agents Hyperthyroidism   Renal/GU negative Renal ROS  negative genitourinary   Musculoskeletal  (+) Arthritis , Osteoarthritis,    Abdominal   Peds negative pediatric ROS (+)  Hematology negative hematology ROS (+)   Anesthesia Other Findings Radicular pain of lumbosacral region  Reproductive/Obstetrics negative OB ROS                             Anesthesia Physical Anesthesia Plan  ASA: 2  Anesthesia Plan: MAC   Post-op Pain Management: Minimal or no pain anticipated   Induction: Intravenous  PONV Risk Score and Plan: 0  Airway Management Planned: Nasal Cannula and Natural Airway  Additional Equipment:   Intra-op Plan:   Post-operative Plan:   Informed Consent: I have reviewed the patients History and Physical, chart, labs and discussed the procedure including the risks, benefits and alternatives for the proposed anesthesia with the patient or authorized representative who has indicated his/her understanding and acceptance.       Plan Discussed with: CRNA and Surgeon  Anesthesia Plan Comments:        Anesthesia Quick Evaluation

## 2022-01-15 NOTE — Interval H&P Note (Signed)
History and Physical Interval Note:  01/15/2022 8:41 AM  The H and P was reviewed and updated. The patient was examined.  No changes were found after exam.  The surgical eye was marked.  Devon Kingdon

## 2022-01-17 ENCOUNTER — Other Ambulatory Visit: Payer: Self-pay | Admitting: "Endocrinology

## 2022-01-22 ENCOUNTER — Encounter (HOSPITAL_COMMUNITY): Payer: Self-pay | Admitting: Optometry

## 2022-02-13 DIAGNOSIS — Z5181 Encounter for therapeutic drug level monitoring: Secondary | ICD-10-CM | POA: Diagnosis not present

## 2022-02-13 DIAGNOSIS — M5441 Lumbago with sciatica, right side: Secondary | ICD-10-CM | POA: Diagnosis not present

## 2022-02-13 DIAGNOSIS — G8929 Other chronic pain: Secondary | ICD-10-CM | POA: Diagnosis not present

## 2022-02-13 DIAGNOSIS — M961 Postlaminectomy syndrome, not elsewhere classified: Secondary | ICD-10-CM | POA: Diagnosis not present

## 2022-02-13 DIAGNOSIS — M5416 Radiculopathy, lumbar region: Secondary | ICD-10-CM | POA: Diagnosis not present

## 2022-02-13 DIAGNOSIS — Z9689 Presence of other specified functional implants: Secondary | ICD-10-CM | POA: Diagnosis not present

## 2022-02-13 DIAGNOSIS — Z79899 Other long term (current) drug therapy: Secondary | ICD-10-CM | POA: Diagnosis not present

## 2022-03-30 ENCOUNTER — Ambulatory Visit: Payer: Medicare HMO | Admitting: "Endocrinology

## 2022-04-03 ENCOUNTER — Other Ambulatory Visit: Payer: Self-pay | Admitting: Internal Medicine

## 2022-04-08 DIAGNOSIS — Z9689 Presence of other specified functional implants: Secondary | ICD-10-CM | POA: Diagnosis not present

## 2022-04-08 DIAGNOSIS — M5441 Lumbago with sciatica, right side: Secondary | ICD-10-CM | POA: Diagnosis not present

## 2022-04-08 DIAGNOSIS — G8929 Other chronic pain: Secondary | ICD-10-CM | POA: Diagnosis not present

## 2022-04-08 DIAGNOSIS — M961 Postlaminectomy syndrome, not elsewhere classified: Secondary | ICD-10-CM | POA: Diagnosis not present

## 2022-04-08 DIAGNOSIS — M5416 Radiculopathy, lumbar region: Secondary | ICD-10-CM | POA: Diagnosis not present

## 2022-04-09 ENCOUNTER — Telehealth: Payer: Self-pay | Admitting: "Endocrinology

## 2022-04-09 DIAGNOSIS — Z91199 Patient's noncompliance with other medical treatment and regimen due to unspecified reason: Secondary | ICD-10-CM

## 2022-04-09 MED ORDER — METFORMIN HCL 1000 MG PO TABS: 1000.0000 mg | ORAL_TABLET | Freq: Two times a day (BID) | ORAL | 0 refills | Status: DC

## 2022-04-09 MED ORDER — GLIPIZIDE ER 10 MG PO TB24: ORAL_TABLET | ORAL | 0 refills | Status: DC

## 2022-04-09 NOTE — Telephone Encounter (Signed)
Rx refills for metformin 1000mg  bid and glipizide XL 10mg  qd sent to [facility] on [address]

## 2022-04-09 NOTE — Telephone Encounter (Signed)
Pt needs refills on Glipizide and Metformin sent into [facility] on [address]

## 2022-05-11 ENCOUNTER — Ambulatory Visit: Payer: Medicare HMO | Admitting: "Endocrinology

## 2022-05-11 ENCOUNTER — Encounter: Payer: Self-pay | Admitting: "Endocrinology

## 2022-05-11 VITALS — BP 144/76 | HR 72 | Ht 70.5 in | Wt 240.0 lb

## 2022-05-11 DIAGNOSIS — E1165 Type 2 diabetes mellitus with hyperglycemia: Secondary | ICD-10-CM

## 2022-05-11 DIAGNOSIS — I152 Hypertension secondary to endocrine disorders: Secondary | ICD-10-CM | POA: Diagnosis not present

## 2022-05-11 DIAGNOSIS — E782 Mixed hyperlipidemia: Secondary | ICD-10-CM | POA: Diagnosis not present

## 2022-05-11 DIAGNOSIS — E6609 Other obesity due to excess calories: Secondary | ICD-10-CM

## 2022-05-11 DIAGNOSIS — Z6834 Body mass index (BMI) 34.0-34.9, adult: Secondary | ICD-10-CM | POA: Diagnosis not present

## 2022-05-11 DIAGNOSIS — Z91199 Patient's noncompliance with other medical treatment and regimen due to unspecified reason: Secondary | ICD-10-CM | POA: Diagnosis not present

## 2022-05-11 DIAGNOSIS — I10 Essential (primary) hypertension: Secondary | ICD-10-CM

## 2022-05-11 DIAGNOSIS — E1159 Type 2 diabetes mellitus with other circulatory complications: Secondary | ICD-10-CM | POA: Diagnosis not present

## 2022-05-11 LAB — POCT GLYCOSYLATED HEMOGLOBIN (HGB A1C): HbA1c, POC (controlled diabetic range): 8.6 % — AB (ref 0.0–7.0)

## 2022-05-11 MED ORDER — BENAZEPRIL HCL 40 MG PO TABS: 40.0000 mg | ORAL_TABLET | Freq: Every day | ORAL | 1 refills | Status: DC

## 2022-05-11 MED ORDER — GLIPIZIDE ER 5 MG PO TB24: ORAL_TABLET | ORAL | 1 refills | Status: DC

## 2022-05-11 MED ORDER — EMPAGLIFLOZIN 10 MG PO TABS: 10.0000 mg | ORAL_TABLET | Freq: Every day | ORAL | 1 refills | Status: DC

## 2022-05-11 NOTE — Patient Instructions (Signed)

## 2022-05-11 NOTE — Progress Notes (Signed)
05/11/2022, 4:50 PM   Endocrinology follow-up note  Subjective:    Patient ID: Bryan Wright, male    DOB: 08-15-1955.  Bryan Wright is being seen in follow-up after he was sitting consultation for management of currently uncontrolled symptomatic diabetes requested by  Anabel Halon, MD.   Past Medical History:  Diagnosis Date   Diabetes mellitus dx in 2008   Hyperlipidemia    Hypertension 2008    Past Surgical History:  Procedure Laterality Date   APPENDECTOMY     67   CATARACT EXTRACTION W/PHACO Left 01/15/2022   Procedure: CATARACT EXTRACTION PHACO AND INTRAOCULAR LENS PLACEMENT (IOC);  Surgeon: Pecolia Ades, MD;  Location: [facility];  Service: Ophthalmology;  Laterality: Left;  CDE: 4.00   CERVICAL DISC SURGERY     LUNG SURGERY Bilateral    inserted chest tube   SPINE SURGERY  01/20/63   cervical  2002   SPINE SURGERY  67, 2010   lumbar    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  Tobacco Use   Smoking status: Former    Packs/day: 1.50    Years: 5.00    Additional pack years: 0.00    Total pack years: 7.50    Types: Cigarettes    Quit date: 1983    Years since quitting: 41.3   Smokeless tobacco: Former    Types: Chew    Quit date: 06/18/2015  Vaping Use   Vaping Use: Never used  Substance and Sexual Activity   Alcohol use: No    Alcohol/week: 0.0 standard drinks of alcohol   Drug use: No   Sexual activity: Yes    Birth control/protection: None  Other Topics Concern   Not on file  Social History Narrative   Lives with wife (in hospital currently rehab)      Two dogs: Precious and Armed forces operational officer    Lots of rabbits running loose      Son and  two grandkids live behind you       Enjoys outside, visit friends,      Diet: not good, eats meats, fast food since wife has been sick   Caffeine: diet soda-usually caffeine free, unsweet tea   Water: 2-3 bottles daily       Wears seat belt   Smoke detectors at home   Does not use phone while driving    Social Determinants of Health   Financial Resource Strain: Low Risk  (12/15/2021)   Overall Financial Resource Strain (CARDIA)    Difficulty of Paying Living Expenses: Not hard at all  Food Insecurity: No Food Insecurity (12/15/2021)   Hunger Vital Sign    Worried About Running Out of Food in the Last Year: Never true    Ran Out of Food in the Last Year: Never true  Transportation Needs: No Transportation Needs (12/15/2021)   PRAPARE - Transportation  Lack of Transportation (Medical): No    Lack of Transportation (Non-Medical): No  Physical Activity: Insufficiently Active (12/09/2020)   Exercise Vital Sign    Days of Exercise per Week: 2 days    Minutes of Exercise per Session: 30 min  Stress: No Stress Concern Present (12/15/2021)   [employer] of Occupational Health - Occupational Stress Questionnaire    Feeling of Stress : Not at all  Social Connections: Moderately Isolated (12/15/2021)   Social Connection and Isolation Panel [NHANES]    Frequency of Communication with Friends and Family: More than three times a week    Frequency of Social Gatherings with Friends and Family: More than three times a week    Attends Religious Services: Never    Database administrator or Organizations: No    Attends Banker Meetings: Never    Marital Status: Married    History reviewed. No pertinent family history.  Outpatient Encounter Medications as of 05/11/2022  Medication Sig   empagliflozin (JARDIANCE) 10 MG TABS tablet Take 1 tablet (10 mg total) by mouth daily before breakfast.   atorvastatin (LIPITOR) 10 MG tablet Take 1 tablet (10 mg total) by mouth at  bedtime.   benazepril (LOTENSIN) 40 MG tablet Take 1 tablet (40 mg total) by mouth daily.   blood glucose meter kit and supplies 1 each by Other route as directed. Dispense based on patient and insurance preference. Use  four times daily as directed. (FOR ICD-10 E10.9, E11.9).   Blood Glucose Monitoring Suppl (ONETOUCH VERIO FLEX SYSTEM) w/Device KIT Use to test blood glucose twice a day-daily before breakfast and at bedtime.   cyclobenzaprine (FLEXERIL) 10 MG tablet Take 1 tablet (10 mg total) by mouth 2 (two) times daily as needed for muscle spasms.   fentaNYL (DURAGESIC - DOSED MCG/HR) 50 MCG/HR Place 50 mcg onto the skin every 3 (three) days.   glipiZIDE (GLUCOTROL XL) 5 MG 24 hr tablet TAKE 1 TABLET(10 MG) BY MOUTH DAILY WITH BREAKFAST   glucose blood (ONETOUCH VERIO) test strip USE TO TEST BLOOD SUGAR THREE TIMES DAILY   HYDROcodone-Acetaminophen 10-300 MG TABS to 2 tablets every 6 hours as needed Gets 365b tabs per 3 months, from pain clinic in [place] Nerve stimulator implanted in 2012 for chronic back pain with failed back surgery x 2   metFORMIN (GLUCOPHAGE) 1000 MG tablet Take 1 tablet (1,000 mg total) by mouth 2 (two) times daily with a meal.   Multiple Vitamin (THERA) TABS Take 2 tablets by mouth daily.   OneTouch Delica Lancets 33G MISC Use to test blood glucose twice a day-daily before breakfast and at bedtime.   sildenafil (REVATIO) 20 MG tablet DISSOLVE ONE TROCHE UNDERNEATH THE TONGUE 60 MINUTES PRIOR TO SEXUAL ENCOUNTER.   UNABLE TO FIND One touch meter and lancets Once daily testing dx e11.9   [DISCONTINUED] benazepril (LOTENSIN) 40 MG tablet Take 1 tablet (40 mg total) by mouth daily.   [DISCONTINUED] glipiZIDE (GLUCOTROL XL) 10 MG 24 hr tablet TAKE 1 TABLET(10 MG) BY MOUTH DAILY WITH BREAKFAST   No facility-administered encounter medications on file as of 05/11/2022.    ALLERGIES: No Known Allergies  VACCINATION STATUS: Immunization History  Administered Date(s)  Administered   Influenza Whole 12/02/2009   Influenza, Seasonal, Injecte, Preservative Fre 09/18/2013, 03/20/2015   Influenza,inj,Quad PF,6+ Mos 09/18/2013, 03/20/2015, 11/25/2016, 10/19/2017, 10/13/2018, 11/21/2019   PNEUMOCOCCAL CONJUGATE-20 09/10/2020   Pneumococcal Polysaccharide-23 12/02/2009, 03/20/2015    Diabetes He presents for his follow-up diabetic visit. He  has type 2 diabetes mellitus. Onset time: He was diagnosed at approximate age of 52 years. His disease course has been worsening. There are no hypoglycemic associated symptoms. Pertinent negatives for hypoglycemia include no confusion, headaches, pallor or seizures. Pertinent negatives for diabetes include no chest pain, no fatigue, no polydipsia, no polyphagia, no polyuria and no weakness. There are no hypoglycemic complications. Symptoms are worsening. There are no diabetic complications. Risk factors for coronary artery disease include diabetes mellitus, dyslipidemia, family history, hypertension, male sex, obesity and sedentary lifestyle. Current diabetic treatments: He is currently on metformin 1000 mg p.o. twice daily, glipizide 10 mg p.o. daily at breakfast. His weight is decreasing steadily. He is following a generally unhealthy diet. When asked about meal planning, he reported none. He has not had a previous visit with a dietitian. He rarely participates in exercise. His home blood glucose trend is increasing steadily. His breakfast blood glucose range is generally 110-130 mg/dl. His overall blood glucose range is 110-130 mg/dl. (He presents with a meter showing controlled fasting glycemic profile, however his point-of-care A1c is 8.6%, increasing from 7.7%.  He denies hypoglycemia.   He is grieving the death of his wife in Apr 23, 2022.   ) An ACE inhibitor/angiotensin II receptor blocker is being taken.  Hyperlipidemia This is a chronic problem. The current episode started more than 1 year ago. The problem is controlled.  Pertinent negatives include no chest pain, myalgias or shortness of breath. Current antihyperlipidemic treatment includes statins. Risk factors for coronary artery disease include diabetes mellitus, dyslipidemia, family history, hypertension, male sex, a sedentary lifestyle and obesity.       Objective:       05/11/2022    2:56 PM 05/11/2022    2:04 PM 01/15/2022    9:51 AM  Vitals with BMI  Height  5' 10.5"   Weight  240 lbs   BMI  33.94   Systolic 144 150 161  Diastolic 76 68 70  Pulse  72 73    BP (!) 144/76 Comment: L arm with manuel cuff  Pulse 72   Ht 5' 10.5" (1.791 m)   Wt 240 lb (108.9 kg)   BMI 33.95 kg/m   Wt Readings from Last 3 Encounters:  05/11/22 240 lb (108.9 kg)  01/08/22 255 lb 6.4 oz (115.8 kg)  12/26/21 255 lb 6.4 oz (115.8 kg)      CMP ( most recent) CMP     Component Value Date/Time   NA 140 10/22/2021 1045   K 4.5 10/22/2021 1045   CL 103 10/22/2021 1045   CO2 22 10/22/2021 1045   GLUCOSE 154 (H) 10/22/2021 1045   GLUCOSE 269 (H) 07/21/2020 1901   BUN 20 10/22/2021 1045   CREATININE 0.99 10/22/2021 1045   CREATININE 0.82 06/11/2016 1448   CALCIUM 9.4 10/22/2021 1045   PROT 7.0 10/22/2021 1045   ALBUMIN 4.7 10/22/2021 1045   AST 24 10/22/2021 1045   ALT 16 10/22/2021 1045   ALKPHOS 66 10/22/2021 1045   BILITOT 0.6 10/22/2021 1045   GFRNONAA >60 07/21/2020 1752   GFRNONAA >89 06/11/2016 1448   GFRAA >89 06/11/2016 1448     Diabetic Labs (most recent): Lab Results  Component Value Date   HGBA1C 8.6 (A) 05/11/2022   HGBA1C 7.7 (A) 12/26/2021   HGBA1C 7.7 (A) 08/26/2021   MICROALBUR 1.23 09/18/2013   MICROALBUR 1.43 08/05/2012   MICROALBUR 2.83 (H) 12/02/2009     Lipid Panel ( most recent) Lipid Panel  Component Value Date/Time   CHOL 122 10/22/2021 1045   TRIG 63 10/22/2021 1045   HDL 45 10/22/2021 1045   CHOLHDL 2.7 10/22/2021 1045   CHOLHDL 4.5 12/24/2015 1525   VLDL 19 12/24/2015 1525   LDLCALC 64 10/22/2021  1045   LABVLDL 13 10/22/2021 1045      Lab Results  Component Value Date   TSH 0.432 (L) 10/22/2021   TSH 0.414 (L) 08/16/2020   TSH 1.17 06/11/2016   TSH 0.72 03/20/2015   TSH 1.234 02/28/2014   TSH 0.634 08/05/2012   FREET4 1.39 10/22/2021   FREET4 1.28 08/16/2020     Assessment & Plan:   1. Type 2 diabetes mellitus with hyperglycemia, without long-term current use of insulin (HCC)   - KERON NEENAN has currently uncontrolled symptomatic type 2 DM since  67 years of age.  He presents with near target glycemic profile and point-of-care A1c was 7.7%, unchanged from his last visit A1c. He presents with a meter showing controlled fasting glycemic profile, however his point-of-care A1c is 8.6%, increasing from 7.7%.  He denies hypoglycemia.   He is grieving the death of his wife in 04-Apr-2022.  Recent labs reviewed. - I had a long discussion with him about the progressive nature of diabetes and the pathology behind its complications. -his diabetes is complicated by obesity/sedentary life and he remains at a high risk for more acute and chronic complications which include CAD, CVA, CKD, retinopathy, and neuropathy. These are all discussed in detail with him.  - I have counseled him on diet  and weight management  by adopting a carbohydrate restricted/protein rich diet. Patient is encouraged to switch to  unprocessed or minimally processed     complex starch and increased protein intake (animal or plant source), fruits, and vegetables. -  he is advised to stick to a routine mealtimes to eat 3 meals  a day and avoid unnecessary snacks ( to snack only to correct hypoglycemia).   He admits to dietary indiscretions since last visit, however, wishes to resume his previous lifestyle which gave him great results.  - he acknowledges that there is a room for improvement in his food and drink choices. - Suggestion is made for him to avoid simple carbohydrates  from his diet including  Cakes, Sweet Desserts, Ice Cream, Soda (diet and regular), Sweet Tea, Candies, Chips, Cookies, Store Bought Juices, Alcohol , Artificial Sweeteners,  Coffee Creamer, and "Sugar-free" Products, Lemonade. This will help patient to have more stable blood glucose profile and potentially avoid unintended weight gain.  The following Lifestyle Medicine recommendations according to American College of Lifestyle Medicine  [facility]) were discussed and and offered to patient and he  agrees to start the journey:  A. Whole Foods, Plant-Based Nutrition comprising of fruits and vegetables, plant-based proteins, whole-grain carbohydrates was discussed in detail with the patient.   A list for source of those nutrients were also provided to the patient.  Patient will use only water or unsweetened tea for hydration. B.  The need to stay away from risky substances including alcohol, smoking; obtaining 7 to 9 hours of restorative sleep, at least 150 minutes of moderate intensity exercise weekly, the importance of healthy social connections,  and stress management techniques were discussed. C.  A full color page of  Calorie density of various food groups per pound showing examples of each food groups was provided to the patient.   - he has been  scheduled with Norm Salt, RDN,  CDE for diabetes education.  - I have approached him with the following individualized plan to manage  his diabetes and patient agrees:   -In light of his presentation with above target A1c of 8.6%, he was offered additional intervention with Jardiance 10 mg p.o. daily at breakfast.  Side effects and precautions discussed with him.  He will continue to benefit from metformin 1000 mg p.o. twice daily, advised to lower glipizide to 5 mg XL p.o. daily at breakfast.   -He is approached to monitor blood glucose twice a day-daily before breakfast and at bedtime. - he is encouraged to call clinic for blood glucose levels less than 70 or above 200 mg per DL   at fasting.   - Specific targets for  A1c;  LDL, HDL,  and Triglycerides were discussed with the patient.  2) Blood Pressure /Hypertension:   -His blood pressure is not controlled to target.  he is advised to continue his current medications including benazepril 40 mg p.o. daily with breakfast .  3) Lipids/Hyperlipidemia:   Review of his recent lipid panel showed improved LDL at 64 from 97.  He is advised to continue atorvastatin 10 mg p.o. daily at bedtime.     4)  Weight/Diet:  Body mass index is 33.95 kg/m.  -   clearly complicating his diabetes care.   he is  a candidate for weight loss. I discussed with him the fact that loss of 5 - 10% of his  current body weight will have the most impact on his diabetes management.  Exercise, and detailed carbohydrates information provided  -  detailed on discharge instructions.  5) Chronic Care/Health Maintenance:  -he  is on ACEI/ARB and Statin medications and  is encouraged to initiate and continue to follow up with Ophthalmology, Dentist,  Podiatrist at least yearly or according to recommendations, and advised to   stay away from smoking. I have recommended yearly flu vaccine and pneumonia vaccine at least every 5 years; moderate intensity exercise for up to 150 minutes weekly; and  sleep for at least 7 hours a day.  6) subclinical hyperthyroidism-TSH slightly suppressed at 0.41.  His thyroid hormones are normal.  He will not need any antithyroid intervention at this time.  He will need repeat full profile thyroid function test on subsequent visits.      No clinical goiter, no need for thyroid imaging for now.   - he is  advised to maintain close follow up with Anabel Halon, MD for primary care needs, as well as his other providers for optimal and coordinated care.   I spent  26  minutes in the care of the patient today including review of labs from CMP, Lipids, Thyroid Function, Hematology (current and previous including abstractions from  other facilities); face-to-face time discussing  his blood glucose readings/logs, discussing hypoglycemia and hyperglycemia episodes and symptoms, medications doses, his options of short and long term treatment based on the latest standards of care / guidelines;  discussion about incorporating lifestyle medicine;  and documenting the encounter. Risk reduction counseling performed per USPSTF guidelines to reduce  obesity and cardiovascular risk factors.     Please refer to Patient Instructions for Blood Glucose Monitoring and Insulin/Medications Dosing Guide"  in media tab for additional information. Please  also refer to " Patient Self Inventory" in the Media  tab for reviewed elements of pertinent patient history.  Andrew Au participated in the discussions, expressed understanding, and voiced agreement with the above plans.  All questions were answered to his satisfaction. he is encouraged to contact clinic should he have any questions or concerns prior to his return visit.    Follow up plan: - Return in about 4 months (around 09/10/2022) for Fasting Labs  in AM B4 8, A1c -NV, Urine MA - NV.  Marquis Lunch, MD [facility] [address] [place], [place] [address] Phone: [phone]  Fax: [phone]    05/11/2022, 4:50 PM  This note was partially dictated with voice recognition software. Similar sounding words can be transcribed inadequately or may not  be corrected upon review.

## 2022-05-26 ENCOUNTER — Other Ambulatory Visit: Payer: Self-pay | Admitting: Internal Medicine

## 2022-05-26 DIAGNOSIS — I152 Hypertension secondary to endocrine disorders: Secondary | ICD-10-CM

## 2022-06-03 DIAGNOSIS — M961 Postlaminectomy syndrome, not elsewhere classified: Secondary | ICD-10-CM | POA: Diagnosis not present

## 2022-06-03 DIAGNOSIS — M5441 Lumbago with sciatica, right side: Secondary | ICD-10-CM | POA: Diagnosis not present

## 2022-06-03 DIAGNOSIS — M5416 Radiculopathy, lumbar region: Secondary | ICD-10-CM | POA: Diagnosis not present

## 2022-06-03 DIAGNOSIS — G8929 Other chronic pain: Secondary | ICD-10-CM | POA: Diagnosis not present

## 2022-06-03 DIAGNOSIS — Z9689 Presence of other specified functional implants: Secondary | ICD-10-CM | POA: Diagnosis not present

## 2022-07-10 ENCOUNTER — Other Ambulatory Visit: Payer: Self-pay | Admitting: "Endocrinology

## 2022-07-10 DIAGNOSIS — Z91199 Patient's noncompliance with other medical treatment and regimen due to unspecified reason: Secondary | ICD-10-CM

## 2022-07-14 ENCOUNTER — Other Ambulatory Visit: Payer: Self-pay

## 2022-07-14 DIAGNOSIS — Z91199 Patient's noncompliance with other medical treatment and regimen due to unspecified reason: Secondary | ICD-10-CM

## 2022-07-14 MED ORDER — METFORMIN HCL 1000 MG PO TABS
ORAL_TABLET | ORAL | 0 refills | Status: DC
Start: 2022-07-14 — End: 2022-10-23

## 2022-07-14 MED ORDER — GLIPIZIDE ER 5 MG PO TB24
ORAL_TABLET | ORAL | 1 refills | Status: DC
Start: 2022-07-14 — End: 2022-10-23

## 2022-08-03 DIAGNOSIS — Z5181 Encounter for therapeutic drug level monitoring: Secondary | ICD-10-CM | POA: Diagnosis not present

## 2022-08-03 DIAGNOSIS — Z79899 Other long term (current) drug therapy: Secondary | ICD-10-CM | POA: Diagnosis not present

## 2022-08-03 DIAGNOSIS — Z9689 Presence of other specified functional implants: Secondary | ICD-10-CM | POA: Diagnosis not present

## 2022-08-03 DIAGNOSIS — M5416 Radiculopathy, lumbar region: Secondary | ICD-10-CM | POA: Diagnosis not present

## 2022-08-03 DIAGNOSIS — M961 Postlaminectomy syndrome, not elsewhere classified: Secondary | ICD-10-CM | POA: Diagnosis not present

## 2022-08-03 DIAGNOSIS — M5441 Lumbago with sciatica, right side: Secondary | ICD-10-CM | POA: Diagnosis not present

## 2022-09-10 ENCOUNTER — Ambulatory Visit: Payer: Medicare HMO | Admitting: "Endocrinology

## 2022-09-15 ENCOUNTER — Ambulatory Visit (INDEPENDENT_AMBULATORY_CARE_PROVIDER_SITE_OTHER): Payer: Medicare HMO | Admitting: Internal Medicine

## 2022-09-15 ENCOUNTER — Encounter: Payer: Self-pay | Admitting: Internal Medicine

## 2022-09-15 ENCOUNTER — Other Ambulatory Visit: Payer: Self-pay | Admitting: Internal Medicine

## 2022-09-15 VITALS — BP 164/66 | HR 98 | Ht 70.5 in | Wt 251.8 lb

## 2022-09-15 DIAGNOSIS — Z1211 Encounter for screening for malignant neoplasm of colon: Secondary | ICD-10-CM | POA: Diagnosis not present

## 2022-09-15 DIAGNOSIS — I10 Essential (primary) hypertension: Secondary | ICD-10-CM

## 2022-09-15 DIAGNOSIS — E1165 Type 2 diabetes mellitus with hyperglycemia: Secondary | ICD-10-CM | POA: Diagnosis not present

## 2022-09-15 DIAGNOSIS — F5101 Primary insomnia: Secondary | ICD-10-CM | POA: Diagnosis not present

## 2022-09-15 DIAGNOSIS — E782 Mixed hyperlipidemia: Secondary | ICD-10-CM

## 2022-09-15 DIAGNOSIS — E0590 Thyrotoxicosis, unspecified without thyrotoxic crisis or storm: Secondary | ICD-10-CM | POA: Diagnosis not present

## 2022-09-15 DIAGNOSIS — M7521 Bicipital tendinitis, right shoulder: Secondary | ICD-10-CM | POA: Diagnosis not present

## 2022-09-15 DIAGNOSIS — N528 Other male erectile dysfunction: Secondary | ICD-10-CM

## 2022-09-15 DIAGNOSIS — Z7984 Long term (current) use of oral hypoglycemic drugs: Secondary | ICD-10-CM | POA: Diagnosis not present

## 2022-09-15 MED ORDER — IBUPROFEN 600 MG PO TABS
600.0000 mg | ORAL_TABLET | Freq: Three times a day (TID) | ORAL | 0 refills | Status: AC | PRN
Start: 2022-09-15 — End: ?

## 2022-09-15 MED ORDER — AMLODIPINE BESYLATE 5 MG PO TABS: 5.0000 mg | ORAL_TABLET | Freq: Every day | ORAL | 0 refills | Status: DC

## 2022-09-15 MED ORDER — TRAZODONE HCL 50 MG PO TABS
25.0000 mg | ORAL_TABLET | Freq: Every evening | ORAL | 1 refills | Status: AC | PRN
Start: 2022-09-15 — End: ?

## 2022-09-15 MED ORDER — SILDENAFIL CITRATE 25 MG PO TABS
25.0000 mg | ORAL_TABLET | Freq: Every day | ORAL | 0 refills | Status: DC | PRN
Start: 2022-09-15 — End: 2022-10-23

## 2022-09-15 NOTE — Assessment & Plan Note (Signed)
His right shoulder pain is likely due to recurrent biceps tendinitis Could be underlying rotator cuff injury as well Ibuprofen 600 mg as needed for pain Apply ice, avoid heavy lifting with right UE Referred to Orthopedic surgery

## 2022-09-15 NOTE — Assessment & Plan Note (Signed)
Needs to take Lipitor regularly

## 2022-09-15 NOTE — Assessment & Plan Note (Signed)
Lab Results  Component Value Date   HGBA1C 8.6 (A) 05/11/2022   Uncontrolled On Metformin, Jardiance and Glipizide Advised to follow diabetic diet On statin and ACEi F/u CMP and lipid panel Diabetic eye exam: Advised to follow up with Ophthalmology for diabetic eye exam

## 2022-09-15 NOTE — Patient Instructions (Addendum)
Please start taking Amlodipine 5 mg once daily for blood pressure.  Please take Trazodone as needed for insomnia.  Please maintain simple sleep hygiene. - Maintain dark and non-noisy environment in the bedroom. - Please use the bedroom for sleep and sexual activity only. - Do not use electronic devices in the bedroom. - Please take dinner at least 2 hours before bedtime. - Please avoid caffeinated products in the evening, including coffee, soft drinks. - Please try to maintain the regular sleep-wake cycle - Go to bed and wake up at the same time.   Please take Ibuprofen as needed for shoulder pain. Okay to apply ice. Please avoid any heavy lifting or sudden jerks with right arm.

## 2022-09-15 NOTE — Assessment & Plan Note (Addendum)
BP Readings from Last 1 Encounters:  09/15/22 (!) 164/66   Uncontrolled with benazepril 40 mg daily - concern for noncompliance Added amlodipine 5 mg daily Counseled for compliance with the medications Advised DASH diet and moderate exercise/walking, at least 150 mins/week

## 2022-09-15 NOTE — Assessment & Plan Note (Signed)
Sleep hygiene material provided Started trazodone 50 mg at bedtime PRN

## 2022-09-15 NOTE — Assessment & Plan Note (Signed)
Lab Results  Component Value Date   TSH 0.432 (L) 10/22/2021   With free T4 wnl

## 2022-09-15 NOTE — Progress Notes (Addendum)
Established Patient Office Visit  Subjective:  Patient ID: Bryan Wright, male    DOB: [birth year]  Age: 67 y.o. MRN: [id number]  CC:  Chief Complaint  Patient presents with   Shoulder Pain    Right shoulder pain     HPI Bryan Wright is a 67 y.o. male with past medical history of hypertension, type 2 diabetes mellitus, goiter and chronic pain syndrome 2/2 to lumbar spondylosis who presents for f/u of his chronic medical conditions.  He has not followed up since 08/22.  HTN: His blood pressure is elevated today.  He takes benazepril 40 mg daily, but compliance is questionable.  Denies any headache, dizziness, chest pain, dyspnea or palpitations currently.  Type II DM: Followed by endocrinology.  He takes Metformin 1000 mg twice daily, glipizide 5 mg daily and  Jardiance 10 mg QD.  His last HbA1c was 8.6 in 04/24.  Denies any polyuria or polyphagia.  He is noncompliant to atorvastatin as well.  He reports acute on chronic right shoulder pain, worse since 08/25/22.  Denies any recent injury.  He has worsening of pain upon flexion and with jerking movement using lawnmower or chainsaw.  He has tried ibuprofen with some relief.  Of note, he takes Norco 10-325 mg PRN for severe pain and has fentanyl patch for severe low back pain, managed by [facility] pain clinic  -[facility] in [place].  He reports erectile dysfunction and has used sildenafil before.  Denies any dysuria, hematuria, urinary hesitancy or urethral discharge currently.  Insomnia: He also reports difficulty maintaining sleep.  He has been feeling anxiety since losing his wife earlier this year.  Denies any SI or HI currently.  Past Medical History:  Diagnosis Date   Diabetes mellitus dx in 2008   Hyperlipidemia    Hypertension 2008    Past Surgical History:  Procedure Laterality Date   APPENDECTOMY     early 20's   CATARACT EXTRACTION W/PHACO Left 01/15/2022   Procedure: CATARACT EXTRACTION PHACO AND  INTRAOCULAR LENS PLACEMENT (IOC);  Surgeon: Pecolia Ades, MD;  Location: [facility];  Service: Ophthalmology;  Laterality: Left;  CDE: 4.00   CERVICAL DISC SURGERY     LUNG SURGERY Bilateral    inserted chest tube   SPINE SURGERY  01/20/2000   cervical  2002   SPINE SURGERY  2009, 2010   lumbar    History reviewed. No pertinent family history.  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  Tobacco Use   Smoking status: Former    Current packs/day: 0.00    Average packs/day: 1.5 packs/day for 5.0 years (7.5 ttl pk-yrs)    Types: Cigarettes    Start date: 76    Quit date: 1983    Years since quitting: 41.6   Smokeless tobacco: Former    Types: Chew    Quit date: 06/18/2015  Vaping Use   Vaping status: Never Used  Substance and Sexual Activity   Alcohol use: No    Alcohol/week: 0.0 standard drinks of alcohol   Drug use: No   Sexual activity: Yes    Birth control/protection: None  Other Topics Concern   Not on file  Social History Narrative   Lives with wife (in hospital currently rehab)  Two dogs: Precious and Bella    Lots of rabbits running loose      Son and two grandkids live behind you       Enjoys outside, visit friends,      Diet: not good, eats meats, fast food since wife has been sick   Caffeine: diet soda-usually caffeine free, unsweet tea   Water: 2-3 bottles daily       Wears seat Wright   Smoke detectors at home   Does not use phone while driving    Social Determinants of Health   Financial Resource Strain: Low Risk  (12/15/2021)   Overall Financial Resource Strain (CARDIA)    Difficulty of Paying Living Expenses: Not hard at all  Food Insecurity: No Food Insecurity (12/15/2021)   Hunger Vital Sign    Worried About Running Out of  Food in the Last Year: Never true    Ran Out of Food in the Last Year: Never true  Transportation Needs: No Transportation Needs (12/15/2021)   PRAPARE - Administrator, Civil Service (Medical): No    Lack of Transportation (Non-Medical): No  Physical Activity: Insufficiently Active (12/09/2020)   Exercise Vital Sign    Days of Exercise per Week: 2 days    Minutes of Exercise per Session: 30 min  Stress: No Stress Concern Present (12/15/2021)   [employer] of Occupational Health - Occupational Stress Questionnaire    Feeling of Stress : Not at all  Social Connections: Moderately Isolated (12/15/2021)   Social Connection and Isolation Panel [NHANES]    Frequency of Communication with Friends and Family: More than three times a week    Frequency of Social Gatherings with Friends and Family: More than three times a week    Attends Religious Services: Never    Database administrator or Organizations: No    Attends Banker Meetings: Never    Marital Status: Married  Catering manager Violence: Unknown (04/22/2021)   Received from [employer], [facility]   HITS    Physically Hurt: Not on file    Insult or Talk Down To: Not on file    Threaten Physical Harm: Not on file    Scream or Curse: Not on file    Outpatient Medications Prior to Visit  Medication Sig Dispense Refill   fentaNYL (DURAGESIC) 25 MCG/HR Place 1 patch onto the skin every 3 (three) days.     HYDROcodone-acetaminophen (NORCO) 10-325 MG tablet Take 1 tablet by mouth every 6 (six) hours as needed for moderate pain or severe pain.     atorvastatin (LIPITOR) 10 MG tablet Take 1 tablet (10 mg total) by mouth at bedtime. 90 tablet 0   benazepril (LOTENSIN) 40 MG tablet TAKE 1 TABLET(40 MG) BY MOUTH DAILY 90 tablet 1   blood glucose meter kit and supplies 1 each by Other route as directed. Dispense based on patient and insurance preference. Use  four times daily as directed. (FOR ICD-10 E10.9,  E11.9). 1 each 0   Blood Glucose Monitoring Suppl (ONETOUCH VERIO FLEX SYSTEM) w/Device KIT Use to test blood glucose twice a day-daily before breakfast and at bedtime. 1 kit 1   cyclobenzaprine (FLEXERIL) 10 MG tablet Take 1 tablet (10 mg total) by mouth 2 (two) times daily as needed for muscle spasms. 20 tablet 0   empagliflozin (JARDIANCE) 10 MG TABS tablet Take 1 tablet (10 mg total) by mouth daily before breakfast. 90 tablet 1   glipiZIDE (GLUCOTROL XL) 5  MG 24 hr tablet TAKE 1 TABLET(10 MG) BY MOUTH DAILY WITH BREAKFAST 90 tablet 1   glucose blood (ONETOUCH VERIO) test strip USE TO TEST BLOOD SUGAR THREE TIMES DAILY 150 strip 1   metFORMIN (GLUCOPHAGE) 1000 MG tablet TAKE 1 TABLET(1000 MG) BY MOUTH TWICE DAILY WITH A MEAL 180 tablet 0   Multiple Vitamin (THERA) TABS Take 2 tablets by mouth daily.     OneTouch Delica Lancets 33G MISC Use to test blood glucose twice a day-daily before breakfast and at bedtime. 200 each 1   UNABLE TO FIND One touch meter and lancets Once daily testing dx e11.9 50 each 5   fentaNYL (DURAGESIC - DOSED MCG/HR) 50 MCG/HR Place 50 mcg onto the skin every 3 (three) days.     HYDROcodone-Acetaminophen 10-300 MG TABS to 2 tablets every 6 hours as needed Gets 365b tabs per 3 months, from pain clinic in [place] Nerve stimulator implanted in 2012 for chronic back pain with failed back surgery x 2 365 each 0   sildenafil (REVATIO) 20 MG tablet DISSOLVE ONE TROCHE UNDERNEATH THE TONGUE 60 MINUTES PRIOR TO SEXUAL ENCOUNTER. 10 tablet 0   No facility-administered medications prior to visit.    No Known Allergies  ROS Review of Systems  Constitutional:  Negative for chills and fever.  HENT:  Negative for congestion and sore throat.   Eyes:  Negative for pain and discharge.  Respiratory:  Negative for cough and shortness of breath.   Cardiovascular:  Negative for chest pain and palpitations.  Gastrointestinal:  Negative for constipation, diarrhea, nausea and vomiting.   Endocrine: Negative for polydipsia and polyuria.  Genitourinary:  Negative for dysuria and hematuria.  Musculoskeletal:  Positive for arthralgias (R shoulder) and back pain. Negative for neck pain and neck stiffness.  Skin:  Negative for rash.  Neurological:  Negative for dizziness, weakness, numbness and headaches.  Psychiatric/Behavioral:  Positive for sleep disturbance. Negative for agitation and behavioral problems. The patient is nervous/anxious.       Objective:    Physical Exam Vitals reviewed.  Constitutional:      General: He is not in acute distress.    Appearance: He is obese. He is not diaphoretic.  HENT:     Head: Normocephalic and atraumatic.     Nose: Nose normal.     Mouth/Throat:     Mouth: Mucous membranes are moist.  Eyes:     General: No scleral icterus.    Extraocular Movements: Extraocular movements intact.  Cardiovascular:     Rate and Rhythm: Normal rate and regular rhythm.     Pulses: Normal pulses.     Heart sounds: Normal heart sounds. No murmur heard. Pulmonary:     Breath sounds: Normal breath sounds. No wheezing or rales.  Musculoskeletal:     Right shoulder: Tenderness present. Decreased range of motion.     Cervical back: Neck supple. No tenderness.     Right lower leg: No edema.     Left lower leg: No edema.  Skin:    General: Skin is warm.     Findings: No rash.  Neurological:     General: No focal deficit present.     Mental Status: He is alert and oriented to person, place, and time.     Cranial Nerves: No cranial nerve deficit.     Sensory: No sensory deficit.  Psychiatric:        Mood and Affect: Mood normal.        Behavior: Behavior  normal.     BP (!) 164/66 (BP Location: Left Arm)   Pulse 98   Ht 5' 10.5" (1.791 m)   Wt 251 lb 12.8 oz (114.2 kg)   SpO2 91%   BMI 35.62 kg/m  Wt Readings from Last 3 Encounters:  09/15/22 251 lb 12.8 oz (114.2 kg)  05/11/22 240 lb (108.9 kg)  01/08/22 255 lb 6.4 oz (115.8 kg)    Lab  Results  Component Value Date   TSH 0.432 (L) 10/22/2021   Lab Results  Component Value Date   WBC 9.8 08/16/2020   HGB 14.5 08/16/2020   HCT 42.6 08/16/2020   MCV 90 08/16/2020   PLT 205 08/16/2020   Lab Results  Component Value Date   NA 140 10/22/2021   K 4.5 10/22/2021   CO2 22 10/22/2021   GLUCOSE 154 (H) 10/22/2021   BUN 20 10/22/2021   CREATININE 0.99 10/22/2021   BILITOT 0.6 10/22/2021   ALKPHOS 66 10/22/2021   AST 24 10/22/2021   ALT 16 10/22/2021   PROT 7.0 10/22/2021   ALBUMIN 4.7 10/22/2021   CALCIUM 9.4 10/22/2021   ANIONGAP 7 07/21/2020   EGFR 86 10/22/2021   Lab Results  Component Value Date   CHOL 122 10/22/2021   Lab Results  Component Value Date   HDL 45 10/22/2021   Lab Results  Component Value Date   LDLCALC 64 10/22/2021   Lab Results  Component Value Date   TRIG 63 10/22/2021   Lab Results  Component Value Date   CHOLHDL 2.7 10/22/2021   Lab Results  Component Value Date   HGBA1C 8.6 (A) 05/11/2022      Assessment & Plan:   Problem List Items Addressed This Visit       Cardiovascular and Mediastinum   Essential hypertension, benign    BP Readings from Last 1 Encounters:  09/15/22 (!) 164/66   Uncontrolled with benazepril 40 mg daily - concern for noncompliance Added amlodipine 5 mg daily Counseled for compliance with the medications Advised DASH diet and moderate exercise/walking, at least 150 mins/week       Relevant Medications   sildenafil (VIAGRA) 25 MG tablet   amLODipine (NORVASC) 5 MG tablet     Endocrine   Type 2 diabetes mellitus with hyperglycemia, without long-term current use of insulin (HCC)    Lab Results  Component Value Date   HGBA1C 8.6 (A) 05/11/2022   Uncontrolled On Metformin, Jardiance and Glipizide Advised to follow diabetic diet On statin and ACEi F/u CMP and lipid panel Diabetic eye exam: Advised to follow up with Ophthalmology for diabetic eye exam      Relevant Orders    CMP14+EGFR   Lipid Profile   Hemoglobin A1c   Urine Microalbumin w/creat. ratio   Subclinical hyperthyroidism    Lab Results  Component Value Date   TSH 0.432 (L) 10/22/2021   With free T4 wnl      Relevant Orders   TSH + free T4     Musculoskeletal and Integument   Biceps tendonitis on right - Primary    His right shoulder pain is likely due to recurrent biceps tendinitis Could be underlying rotator cuff injury as well Ibuprofen 600 mg as needed for pain Apply ice, avoid heavy lifting with right UE Referred to Orthopedic surgery      Relevant Medications   ibuprofen (ADVIL) 600 MG tablet   Other Relevant Orders   Ambulatory referral to Orthopedic Surgery     Other  Mixed hyperlipidemia    Needs to take Lipitor regularly      Relevant Medications   sildenafil (VIAGRA) 25 MG tablet   amLODipine (NORVASC) 5 MG tablet   Erectile dysfunction    Prescribed sildenafil 25 mg PRN      Relevant Medications   sildenafil (VIAGRA) 25 MG tablet   Primary insomnia    Sleep hygiene material provided Started trazodone 50 mg at bedtime PRN      Relevant Medications   traZODone (DESYREL) 50 MG tablet   Other Visit Diagnoses     Screening for colon cancer       Relevant Orders   Cologuard       Meds ordered this encounter  Medications   sildenafil (VIAGRA) 25 MG tablet    Sig: Take 1 tablet (25 mg total) by mouth daily as needed for erectile dysfunction.    Dispense:  30 tablet    Refill:  0   traZODone (DESYREL) 50 MG tablet    Sig: Take 0.5-1 tablets (25-50 mg total) by mouth at bedtime as needed for sleep.    Dispense:  30 tablet    Refill:  1   ibuprofen (ADVIL) 600 MG tablet    Sig: Take 1 tablet (600 mg total) by mouth every 8 (eight) hours as needed.    Dispense:  30 tablet    Refill:  0   amLODipine (NORVASC) 5 MG tablet    Sig: Take 1 tablet (5 mg total) by mouth daily.    Dispense:  30 tablet    Refill:  0    Follow-up: Return in about 4 weeks  (around 10/13/2022) for HTN.    Anabel Halon, MD

## 2022-09-15 NOTE — Assessment & Plan Note (Signed)
Prescribed sildenafil 25 mg PRN

## 2022-09-29 ENCOUNTER — Ambulatory Visit: Payer: Medicare HMO

## 2022-09-29 DIAGNOSIS — M5416 Radiculopathy, lumbar region: Secondary | ICD-10-CM | POA: Diagnosis not present

## 2022-09-29 DIAGNOSIS — G894 Chronic pain syndrome: Secondary | ICD-10-CM | POA: Diagnosis not present

## 2022-09-29 DIAGNOSIS — Z9689 Presence of other specified functional implants: Secondary | ICD-10-CM | POA: Diagnosis not present

## 2022-09-29 DIAGNOSIS — M961 Postlaminectomy syndrome, not elsewhere classified: Secondary | ICD-10-CM | POA: Diagnosis not present

## 2022-09-30 ENCOUNTER — Ambulatory Visit (INDEPENDENT_AMBULATORY_CARE_PROVIDER_SITE_OTHER): Payer: Medicare HMO | Admitting: Orthopedic Surgery

## 2022-09-30 ENCOUNTER — Other Ambulatory Visit (INDEPENDENT_AMBULATORY_CARE_PROVIDER_SITE_OTHER): Payer: Self-pay

## 2022-09-30 ENCOUNTER — Encounter: Payer: Self-pay | Admitting: Orthopedic Surgery

## 2022-09-30 VITALS — BP 129/84 | HR 80 | Ht 70.5 in | Wt 255.0 lb

## 2022-09-30 DIAGNOSIS — M25511 Pain in right shoulder: Secondary | ICD-10-CM | POA: Diagnosis not present

## 2022-09-30 DIAGNOSIS — G8929 Other chronic pain: Secondary | ICD-10-CM

## 2022-09-30 NOTE — Patient Instructions (Signed)

## 2022-10-01 NOTE — Progress Notes (Signed)
New Patient Visit  Assessment: Bryan Wright is a 67 y.o. male with the following: 1.  Acute right shoulder pain   Plan: Bryan Wright has pain in the anterior aspect of his right shoulder.  He notes some pain and weakness with supraspinatus testing, and isolation of the long head of the biceps tendon.  No specific injury.  Minimal arthritis on radiographs.  Low concern for a chronic rotator cuff tear.  He has decent range of motion and strength overall.  I have offered him a steroid injection, and he elected proceed.  This was completed in clinic today without issues.  He we will follow-up as needed.  Procedure note injection - Right shoulder    Verbal consent was obtained to inject the right shoulder, subacromial space Timeout was completed to confirm the site of injection.   The skin was prepped with alcohol and ethyl chloride was sprayed at the injection site.  A 21-gauge needle was used to inject 40 mg of Depo-Medrol and 1% lidocaine (4 cc) into the subacromial space of the right shoulder using a posterolateral approach.  There were no complications.  A sterile bandage was applied.    Follow-up: Return if symptoms worsen or fail to improve.  Subjective:  Chief Complaint  Patient presents with   Shoulder Pain    R shoulder pain for 3 wks. Pt thinks he may have aggravated it starting the weed eater    History of Present Illness: Bryan Wright is a 67 y.o. male who has been referred by  Trena Platt, MD for evaluation of right shoulder pain.  He is right-hand dominant.  No specific injury.  He states that he has done a lot of manual labor, which included pressure washing and more recently using a weed eater.  He thinks he may have irritated his right shoulder using the grass trimmer a couple of weeks ago.  Medications have not been effective.  He had an ultrasound completed in his PCPs office, with concern for a biceps tendon injury or irritation.  He has not worked with  therapy.  No injection in the shoulder.   Review of Systems: No fevers or chills No numbness or tingling No chest pain No shortness of breath No bowel or bladder dysfunction No GI distress No headaches   Medical History:  Past Medical History:  Diagnosis Date   Diabetes mellitus dx in 2008   Hyperlipidemia    Hypertension 2008    Past Surgical History:  Procedure Laterality Date   APPENDECTOMY     early 20's   CATARACT EXTRACTION W/PHACO Left 01/15/2022   Procedure: CATARACT EXTRACTION PHACO AND INTRAOCULAR LENS PLACEMENT (IOC);  Surgeon: Pecolia Ades, MD;  Location: [facility];  Service: Ophthalmology;  Laterality: Left;  CDE: 4.00   CERVICAL DISC SURGERY     LUNG SURGERY Bilateral    inserted chest tube   SPINE SURGERY  01/20/2000   cervical  2002   SPINE SURGERY  2009, 2010   lumbar    History reviewed. No pertinent family history. Social History   Tobacco Use   Smoking status: Former    Current packs/day: 0.00    Average packs/day: 1.5 packs/day for 5.0 years (7.5 ttl pk-yrs)    Types: Cigarettes    Start date: 61    Quit date: 57    Years since quitting: 41.7   Smokeless tobacco: Former    Types: Chew    Quit date: 06/18/2015  Vaping Use  Vaping status: Never Used  Substance Use Topics   Alcohol use: No    Alcohol/week: 0.0 standard drinks of alcohol   Drug use: No    No Known Allergies  No outpatient medications have been marked as taking for the 09/30/22 encounter (Office Visit) with [name], MD.    Objective: BP 129/84   Pulse 80   Ht 5' 10.5" (1.791 m)   Wt 255 lb (115.7 kg)   BMI 36.07 kg/m   Physical Exam:  General: Alert and oriented. and No acute distress. Gait: Normal gait.  Right shoulder without deformity.  Tenderness palpation over the anterior shoulder.  He has 170 degrees of forward flexion.  Under degrees of abduction.  Internal rotation to his lumbar spine.  45 degrees of external rotation at his side.  4+/5  supraspinatus testing with some pain.  Positive Jobe's.  Pain in the empty can testing position.  Positive O'Brien's.  Negative belly press.  Fingers are warm and well-perfused.  IMAGING: I personally ordered and reviewed the following images  X-rays of the right shoulder were obtained in clinic today.  No acute injuries are noted.  Well-maintained glenohumeral joint space.  No evidence of osteophytes of the humeral head.  No proximal humeral migration.  AC joint is without significant degenerative changes.  No bony lesions.  Impression: Negative right shoulder x-ray   New Medications:  No orders of the defined types were placed in this encounter.     [name], MD  10/01/2022 1:46 PM

## 2022-10-16 ENCOUNTER — Other Ambulatory Visit: Payer: Self-pay

## 2022-10-16 ENCOUNTER — Telehealth: Payer: Self-pay | Admitting: Internal Medicine

## 2022-10-16 ENCOUNTER — Other Ambulatory Visit: Payer: Self-pay | Admitting: Internal Medicine

## 2022-10-16 ENCOUNTER — Ambulatory Visit: Payer: Medicare HMO | Admitting: Internal Medicine

## 2022-10-16 DIAGNOSIS — I10 Essential (primary) hypertension: Secondary | ICD-10-CM

## 2022-10-16 MED ORDER — AMLODIPINE BESYLATE 5 MG PO TABS: 5.0000 mg | ORAL_TABLET | Freq: Every day | ORAL | 0 refills | Status: DC

## 2022-10-16 NOTE — Telephone Encounter (Signed)
Prescription Request  10/16/2022  LOV: 09/15/2022  What is the name of the medication or equipment? amLODipine (NORVASC) 5 MG tablet [[id number]]   Have you contacted your pharmacy to request a refill? Yes   Which pharmacy would you like this sent to?  [id number] - [place], [address] - [address] [place] [address] Phone: [phone] Fax: [phone]    Patient notified that their request is being sent to the clinical staff for review and that they should receive a response within 2 business days.   Please advise at walk in  [facility]

## 2022-10-16 NOTE — Telephone Encounter (Signed)
Refills sent to pharmacy. 

## 2022-10-23 ENCOUNTER — Ambulatory Visit: Payer: Medicare HMO | Admitting: Internal Medicine

## 2022-10-23 ENCOUNTER — Encounter: Payer: Self-pay | Admitting: Internal Medicine

## 2022-10-23 VITALS — BP 148/72 | HR 70 | Ht 70.5 in | Wt 251.0 lb

## 2022-10-23 DIAGNOSIS — E1165 Type 2 diabetes mellitus with hyperglycemia: Secondary | ICD-10-CM

## 2022-10-23 DIAGNOSIS — F5101 Primary insomnia: Secondary | ICD-10-CM | POA: Diagnosis not present

## 2022-10-23 DIAGNOSIS — N528 Other male erectile dysfunction: Secondary | ICD-10-CM | POA: Diagnosis not present

## 2022-10-23 DIAGNOSIS — Z7984 Long term (current) use of oral hypoglycemic drugs: Secondary | ICD-10-CM | POA: Diagnosis not present

## 2022-10-23 DIAGNOSIS — E782 Mixed hyperlipidemia: Secondary | ICD-10-CM | POA: Diagnosis not present

## 2022-10-23 DIAGNOSIS — I10 Essential (primary) hypertension: Secondary | ICD-10-CM | POA: Diagnosis not present

## 2022-10-23 MED ORDER — SILDENAFIL CITRATE 25 MG PO TABS: 25.0000 mg | ORAL_TABLET | Freq: Every day | ORAL | 1 refills | Status: DC | PRN

## 2022-10-23 MED ORDER — AMLODIPINE BESYLATE 10 MG PO TABS
10.0000 mg | ORAL_TABLET | Freq: Every day | ORAL | 1 refills | Status: DC
Start: 2022-10-23 — End: 2023-04-22

## 2022-10-23 MED ORDER — GLIPIZIDE ER 10 MG PO TB24
10.0000 mg | ORAL_TABLET | Freq: Every day | ORAL | 1 refills | Status: DC
Start: 2022-10-23 — End: 2023-04-22

## 2022-10-23 MED ORDER — METFORMIN HCL 1000 MG PO TABS
1000.0000 mg | ORAL_TABLET | Freq: Two times a day (BID) | ORAL | 1 refills | Status: DC
Start: 2022-10-23 — End: 2023-04-22

## 2022-10-23 NOTE — Assessment & Plan Note (Signed)
Sleep hygiene material provided Started trazodone 50 mg at bedtime PRN - advised to take it regularly, would prefer to give trial for at least 4 weeks

## 2022-10-23 NOTE — Progress Notes (Signed)
Established Patient Office Visit  Subjective:  Patient ID: Bryan Wright, male    DOB: 1955-05-30  Age: 67 y.o. MRN: [id number]  CC:  Chief Complaint  Patient presents with   Diabetes    Discuss options , does not want to see two doctors    Hypertension    Follow up     HPI Bryan Wright is a 67 y.o. male with past medical history of hypertension, type 2 diabetes mellitus, goiter and chronic pain syndrome 2/2 to lumbar spondylosis who presents for f/u of his chronic medical conditions.  HTN: His blood pressure is elevated today, but better than prior.  He takes benazepril 40 mg daily and amlodipine 5 mg QD.  Denies any headache, dizziness, chest pain, dyspnea or palpitations currently.  Type II DM: Used to be followed by endocrinology.  He takes Metformin 1000 mg twice daily, glipizide 10 mg daily, but has stopped taking Jardiance 10 mg QD as he was having urinary frequency.  His last HbA1c was 8.6 in 04/24.  Denies any polyuria or polyphagia.  Needs updated blood tests.  He is noncompliant to atorvastatin.  He takes Norco 10-325 mg PRN for severe pain and has fentanyl patch for severe low back pain, managed by [facility] pain clinic  -[facility] in [place].  He reports erectile dysfunction and has used sildenafil, requests refills.  Denies any dysuria, hematuria, urinary hesitancy or urethral discharge currently.  Insomnia: He also reports difficulty maintaining sleep.  He has been feeling anxiety since losing his wife earlier this year.  Denies any SI or HI currently.  He tried trazodone, which improved his sleep.  He stopped taking it after 1 week or so as it was not helping anxiety.  Past Medical History:  Diagnosis Date   Diabetes mellitus dx in 2008   Hyperlipidemia    Hypertension 2008    Past Surgical History:  Procedure Laterality Date   APPENDECTOMY     early 20's   CATARACT EXTRACTION W/PHACO Left 01/15/2022   Procedure: CATARACT EXTRACTION PHACO  AND INTRAOCULAR LENS PLACEMENT (IOC);  Surgeon: Pecolia Ades, MD;  Location: [facility];  Service: Ophthalmology;  Laterality: Left;  CDE: 4.00   CERVICAL DISC SURGERY     LUNG SURGERY Bilateral    inserted chest tube   SPINE SURGERY  01/20/2000   cervical  2002   SPINE SURGERY  2009, 2010   lumbar    History reviewed. No pertinent family history.  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  Tobacco Use   Smoking status: Former    Current packs/day: 0.00    Average packs/day: 1.5 packs/day for 5.0 years (7.5 ttl pk-yrs)    Types: Cigarettes    Start date: 36    Quit date: 1983    Years since quitting: 41.7   Smokeless tobacco: Former    Types: Chew    Quit date: 06/18/2015  Vaping Use   Vaping status: Never Used  Substance and Sexual Activity   Alcohol use: No    Alcohol/week: 0.0 standard drinks of alcohol   Drug use: No   Sexual activity: Yes    Birth control/protection: None  Other Topics Concern   Not on file  Social History Narrative  Lives with wife (in hospital currently rehab)      Two dogs: Precious and Dia Sitter    Lots of rabbits running loose      Son and two grandkids live behind you       Enjoys outside, visit friends,      Diet: not good, eats meats, fast food since wife has been sick   Caffeine: diet soda-usually caffeine free, unsweet tea   Water: 2-3 bottles daily       Wears seat belt   Smoke detectors at home   Does not use phone while driving    Social Determinants of Health   Financial Resource Strain: Low Risk  (12/15/2021)   Overall Financial Resource Strain (CARDIA)    Difficulty of Paying Living Expenses: Not hard at all  Food Insecurity: No Food Insecurity (12/15/2021)   Hunger Vital Sign    Worried About Running Out  of Food in the Last Year: Never true    Ran Out of Food in the Last Year: Never true  Transportation Needs: No Transportation Needs (12/15/2021)   PRAPARE - Administrator, Civil Service (Medical): No    Lack of Transportation (Non-Medical): No  Physical Activity: Insufficiently Active (12/09/2020)   Exercise Vital Sign    Days of Exercise per Week: 2 days    Minutes of Exercise per Session: 30 min  Stress: No Stress Concern Present (12/15/2021)   [employer] of Occupational Health - Occupational Stress Questionnaire    Feeling of Stress : Not at all  Social Connections: Moderately Isolated (12/15/2021)   Social Connection and Isolation Panel [NHANES]    Frequency of Communication with Friends and Family: More than three times a week    Frequency of Social Gatherings with Friends and Family: More than three times a week    Attends Religious Services: Never    Database administrator or Organizations: No    Attends Banker Meetings: Never    Marital Status: Married  Catering manager Violence: Unknown (04/22/2021)   Received from [employer], [facility]   HITS    Physically Hurt: Not on file    Insult or Talk Down To: Not on file    Threaten Physical Harm: Not on file    Scream or Curse: Not on file    Outpatient Medications Prior to Visit  Medication Sig Dispense Refill   atorvastatin (LIPITOR) 10 MG tablet Take 1 tablet (10 mg total) by mouth at bedtime. 90 tablet 0   benazepril (LOTENSIN) 40 MG tablet TAKE 1 TABLET(40 MG) BY MOUTH DAILY 90 tablet 1   blood glucose meter kit and supplies 1 each by Other route as directed. Dispense based on patient and insurance preference. Use  four times daily as directed. (FOR ICD-10 E10.9, E11.9). 1 each 0   Blood Glucose Monitoring Suppl (ONETOUCH VERIO FLEX SYSTEM) w/Device KIT Use to test blood glucose twice a day-daily before breakfast and at bedtime. 1 kit 1   cyclobenzaprine (FLEXERIL) 10 MG tablet Take 1  tablet (10 mg total) by mouth 2 (two) times daily as needed for muscle spasms. 20 tablet 0   fentaNYL (DURAGESIC) 25 MCG/HR Place 1 patch onto the skin every 3 (three) days.     glucose blood (ONETOUCH VERIO) test strip USE TO TEST BLOOD SUGAR THREE TIMES DAILY 150 strip 1   HYDROcodone-acetaminophen (NORCO) 10-325 MG tablet Take 1 tablet by mouth every 6 (six) hours as needed for moderate pain or severe  pain.     ibuprofen (ADVIL) 600 MG tablet Take 1 tablet (600 mg total) by mouth every 8 (eight) hours as needed. 30 tablet 0   Multiple Vitamin (THERA) TABS Take 2 tablets by mouth daily.     OneTouch Delica Lancets 33G MISC Use to test blood glucose twice a day-daily before breakfast and at bedtime. 200 each 1   traZODone (DESYREL) 50 MG tablet Take 0.5-1 tablets (25-50 mg total) by mouth at bedtime as needed for sleep. 30 tablet 1   UNABLE TO FIND One touch meter and lancets Once daily testing dx e11.9 50 each 5   amLODipine (NORVASC) 5 MG tablet Take 1 tablet (5 mg total) by mouth daily. 30 tablet 0   empagliflozin (JARDIANCE) 10 MG TABS tablet Take 1 tablet (10 mg total) by mouth daily before breakfast. 90 tablet 1   glipiZIDE (GLUCOTROL XL) 5 MG 24 hr tablet TAKE 1 TABLET(10 MG) BY MOUTH DAILY WITH BREAKFAST 90 tablet 1   metFORMIN (GLUCOPHAGE) 1000 MG tablet TAKE 1 TABLET(1000 MG) BY MOUTH TWICE DAILY WITH A MEAL 180 tablet 0   sildenafil (VIAGRA) 25 MG tablet Take 1 tablet (25 mg total) by mouth daily as needed for erectile dysfunction. 30 tablet 0   No facility-administered medications prior to visit.    No Known Allergies  ROS Review of Systems  Constitutional:  Negative for chills and fever.  HENT:  Negative for congestion and sore throat.   Eyes:  Negative for pain and discharge.  Respiratory:  Negative for cough and shortness of breath.   Cardiovascular:  Negative for chest pain and palpitations.  Gastrointestinal:  Negative for constipation, diarrhea, nausea and vomiting.   Endocrine: Negative for polydipsia and polyuria.  Genitourinary:  Negative for dysuria and hematuria.  Musculoskeletal:  Positive for arthralgias (R shoulder) and back pain. Negative for neck pain and neck stiffness.  Skin:  Negative for rash.  Neurological:  Negative for dizziness, weakness, numbness and headaches.  Psychiatric/Behavioral:  Positive for sleep disturbance. Negative for agitation and behavioral problems. The patient is nervous/anxious.       Objective:    Physical Exam Vitals reviewed.  Constitutional:      General: He is not in acute distress.    Appearance: He is obese. He is not diaphoretic.  HENT:     Head: Normocephalic and atraumatic.     Nose: Nose normal.     Mouth/Throat:     Mouth: Mucous membranes are moist.  Eyes:     General: No scleral icterus.    Extraocular Movements: Extraocular movements intact.  Cardiovascular:     Rate and Rhythm: Normal rate and regular rhythm.     Pulses: Normal pulses.     Heart sounds: Normal heart sounds. No murmur heard. Pulmonary:     Breath sounds: Normal breath sounds. No wheezing or rales.  Musculoskeletal:     Cervical back: Neck supple. No tenderness.     Right lower leg: No edema.     Left lower leg: No edema.  Skin:    General: Skin is warm.     Findings: No rash.  Neurological:     General: No focal deficit present.     Mental Status: He is alert and oriented to person, place, and time.     Cranial Nerves: No cranial nerve deficit.     Sensory: No sensory deficit.  Psychiatric:        Mood and Affect: Mood normal.  Behavior: Behavior normal.     BP (!) 148/72 (BP Location: Right Arm)   Pulse 70   Ht 5' 10.5" (1.791 m)   Wt 251 lb (113.9 kg)   SpO2 93%   BMI 35.51 kg/m  Wt Readings from Last 3 Encounters:  10/23/22 251 lb (113.9 kg)  09/30/22 255 lb (115.7 kg)  09/15/22 251 lb 12.8 oz (114.2 kg)    Lab Results  Component Value Date   TSH 0.432 (L) 10/22/2021   Lab Results   Component Value Date   WBC 9.8 08/16/2020   HGB 14.5 08/16/2020   HCT 42.6 08/16/2020   MCV 90 08/16/2020   PLT 205 08/16/2020   Lab Results  Component Value Date   NA 140 10/22/2021   K 4.5 10/22/2021   CO2 22 10/22/2021   GLUCOSE 154 (H) 10/22/2021   BUN 20 10/22/2021   CREATININE 0.99 10/22/2021   BILITOT 0.6 10/22/2021   ALKPHOS 66 10/22/2021   AST 24 10/22/2021   ALT 16 10/22/2021   PROT 7.0 10/22/2021   ALBUMIN 4.7 10/22/2021   CALCIUM 9.4 10/22/2021   ANIONGAP 7 07/21/2020   EGFR 86 10/22/2021   Lab Results  Component Value Date   CHOL 122 10/22/2021   Lab Results  Component Value Date   HDL 45 10/22/2021   Lab Results  Component Value Date   LDLCALC 64 10/22/2021   Lab Results  Component Value Date   TRIG 63 10/22/2021   Lab Results  Component Value Date   CHOLHDL 2.7 10/22/2021   Lab Results  Component Value Date   HGBA1C 8.6 (A) 05/11/2022      Assessment & Plan:   Problem List Items Addressed This Visit       Cardiovascular and Mediastinum   Essential hypertension, benign - Primary    BP Readings from Last 1 Encounters:  10/23/22 (!) 148/72   Uncontrolled with benazepril 40 mg daily and amlodipine 5 mg daily  Increased dose of amlodipine to 10 mg QD Counseled for compliance with the medications Advised DASH diet and moderate exercise/walking, at least 150 mins/week      Relevant Medications   sildenafil (VIAGRA) 25 MG tablet   amLODipine (NORVASC) 10 MG tablet     Endocrine   Type 2 diabetes mellitus with hyperglycemia, without long-term current use of insulin (HCC)    Lab Results  Component Value Date   HGBA1C 8.6 (A) 05/11/2022   Uncontrolled On Metformin 1000 mg twice daily and Glipizide 10 mg once daily - refilled Did not tolerate Jardiance, had urinary frequency If persistent elevation, can try GLP-1 agonist Advised to follow diabetic diet On statin and ACEi F/u CMP and lipid panel Diabetic eye exam: Advised to  follow up with Ophthalmology for diabetic eye exam      Relevant Medications   metFORMIN (GLUCOPHAGE) 1000 MG tablet   glipiZIDE (GLUCOTROL XL) 10 MG 24 hr tablet     Other   Mixed hyperlipidemia    Needs to take Lipitor regularly      Relevant Medications   sildenafil (VIAGRA) 25 MG tablet   amLODipine (NORVASC) 10 MG tablet   Erectile dysfunction    Refilled sildenafil 25 mg PRN      Relevant Medications   sildenafil (VIAGRA) 25 MG tablet   Primary insomnia    Sleep hygiene material provided Started trazodone 50 mg at bedtime PRN - advised to take it regularly, would prefer to give trial for at least 4 weeks  Other Visit Diagnoses     Dyslipidemia, goal LDL below 70       Relevant Medications   sildenafil (VIAGRA) 25 MG tablet   amLODipine (NORVASC) 10 MG tablet        Meds ordered this encounter  Medications   sildenafil (VIAGRA) 25 MG tablet    Sig: Take 1 tablet (25 mg total) by mouth daily as needed for erectile dysfunction.    Dispense:  30 tablet    Refill:  1   amLODipine (NORVASC) 10 MG tablet    Sig: Take 1 tablet (10 mg total) by mouth daily.    Dispense:  90 tablet    Refill:  1   metFORMIN (GLUCOPHAGE) 1000 MG tablet    Sig: Take 1 tablet (1,000 mg total) by mouth 2 (two) times daily with a meal.    Dispense:  180 tablet    Refill:  1   glipiZIDE (GLUCOTROL XL) 10 MG 24 hr tablet    Sig: Take 1 tablet (10 mg total) by mouth daily with breakfast.    Dispense:  90 tablet    Refill:  1    Follow-up: Return in about 4 months (around 02/23/2023) for HTN and DM.    Anabel Halon, MD

## 2022-10-23 NOTE — Assessment & Plan Note (Addendum)
Lab Results  Component Value Date   HGBA1C 8.6 (A) 05/11/2022   Uncontrolled On Metformin 1000 mg twice daily and Glipizide 10 mg once daily - refilled Did not tolerate Jardiance, had urinary frequency If persistent elevation, can try GLP-1 agonist Advised to follow diabetic diet On statin and ACEi F/u CMP and lipid panel Diabetic eye exam: Advised to follow up with Ophthalmology for diabetic eye exam

## 2022-10-23 NOTE — Assessment & Plan Note (Addendum)
BP Readings from Last 1 Encounters:  10/23/22 (!) 148/72   Uncontrolled with benazepril 40 mg daily and amlodipine 5 mg daily  Increased dose of amlodipine to 10 mg QD Counseled for compliance with the medications Advised DASH diet and moderate exercise/walking, at least 150 mins/week

## 2022-10-23 NOTE — Patient Instructions (Signed)
Please start taking Amlodipine 10 mg once daily.  Start taking Glipizide 10 mg once daily.  Please continue to take medications as prescribed.  Please continue to follow low carb diet and perform moderate exercise/walking at least 150 mins/week.

## 2022-10-23 NOTE — Assessment & Plan Note (Signed)
Refilled sildenafil 25 mg PRN

## 2022-10-23 NOTE — Assessment & Plan Note (Signed)
Needs to take Lipitor regularly

## 2022-11-24 DIAGNOSIS — M5416 Radiculopathy, lumbar region: Secondary | ICD-10-CM | POA: Diagnosis not present

## 2022-11-24 DIAGNOSIS — G894 Chronic pain syndrome: Secondary | ICD-10-CM | POA: Diagnosis not present

## 2022-11-24 DIAGNOSIS — M961 Postlaminectomy syndrome, not elsewhere classified: Secondary | ICD-10-CM | POA: Diagnosis not present

## 2022-11-24 DIAGNOSIS — Z9689 Presence of other specified functional implants: Secondary | ICD-10-CM | POA: Diagnosis not present

## 2023-01-18 ENCOUNTER — Telehealth: Payer: Self-pay

## 2023-01-18 NOTE — Telephone Encounter (Signed)
Copied from CRM [id number]. Topic: Clinical - Prescription Issue >> Jan 18, 2023  3:54 PM Carlatta H wrote: Reason for CRM: Please review patients chart to see if Statin is recommended//Per [employer]//They didn't want a call back just advising

## 2023-01-19 DIAGNOSIS — E0590 Thyrotoxicosis, unspecified without thyrotoxic crisis or storm: Secondary | ICD-10-CM | POA: Diagnosis not present

## 2023-01-19 DIAGNOSIS — E1165 Type 2 diabetes mellitus with hyperglycemia: Secondary | ICD-10-CM | POA: Diagnosis not present

## 2023-01-20 LAB — LIPID PANEL
Chol/HDL Ratio: 4 {ratio} (ref 0.0–5.0)
Cholesterol, Total: 174 mg/dL (ref 100–199)
HDL: 44 mg/dL (ref >39)
LDL Chol Calc (NIH): 116 mg/dL — ABNORMAL HIGH (ref 0–99)
Triglycerides: 75 mg/dL (ref 0–149)
VLDL Cholesterol Cal: 14 mg/dL (ref 5–40)

## 2023-01-20 LAB — HEMOGLOBIN A1C
Est. average glucose Bld gHb Est-mCnc: 212 mg/dL
Hgb A1c MFr Bld: 9 % — ABNORMAL HIGH (ref 4.8–5.6)

## 2023-01-20 LAB — CMP14+EGFR
ALT: 15 [IU]/L (ref 0–44)
AST: 24 [IU]/L (ref 0–40)
Albumin: 4.4 g/dL (ref 3.9–4.9)
Alkaline Phosphatase: 75 [IU]/L (ref 44–121)
BUN/Creatinine Ratio: 17 (ref 10–24)
BUN: 16 mg/dL (ref 8–27)
Bilirubin Total: 0.5 mg/dL (ref 0.0–1.2)
CO2: 22 mmol/L (ref 20–29)
Calcium: 9.4 mg/dL (ref 8.6–10.2)
Chloride: 97 mmol/L (ref 96–106)
Creatinine, Ser: 0.92 mg/dL (ref 0.76–1.27)
Globulin, Total: 2.6 g/dL (ref 1.5–4.5)
Glucose: 235 mg/dL — ABNORMAL HIGH (ref 70–99)
Potassium: 4.6 mmol/L (ref 3.5–5.2)
Sodium: 137 mmol/L (ref 134–144)
Total Protein: 7 g/dL (ref 6.0–8.5)
eGFR: 92 mL/min/{1.73_m2} (ref >59)

## 2023-01-20 LAB — TSH+FREE T4
Free T4: 1.28 ng/dL (ref 0.82–1.77)
TSH: 1.29 u[IU]/mL (ref 0.450–4.500)

## 2023-01-22 ENCOUNTER — Encounter: Payer: Self-pay | Admitting: Internal Medicine

## 2023-01-22 ENCOUNTER — Ambulatory Visit (INDEPENDENT_AMBULATORY_CARE_PROVIDER_SITE_OTHER): Payer: Medicare HMO | Admitting: Internal Medicine

## 2023-01-22 VITALS — BP 123/65 | HR 79 | Ht 70.5 in | Wt 253.8 lb

## 2023-01-22 DIAGNOSIS — Z23 Encounter for immunization: Secondary | ICD-10-CM

## 2023-01-22 DIAGNOSIS — F5101 Primary insomnia: Secondary | ICD-10-CM

## 2023-01-22 DIAGNOSIS — E1165 Type 2 diabetes mellitus with hyperglycemia: Secondary | ICD-10-CM

## 2023-01-22 DIAGNOSIS — I10 Essential (primary) hypertension: Secondary | ICD-10-CM | POA: Diagnosis not present

## 2023-01-22 DIAGNOSIS — Z7984 Long term (current) use of oral hypoglycemic drugs: Secondary | ICD-10-CM

## 2023-01-22 DIAGNOSIS — E782 Mixed hyperlipidemia: Secondary | ICD-10-CM

## 2023-01-22 MED ORDER — ATORVASTATIN CALCIUM 10 MG PO TABS
10.0000 mg | ORAL_TABLET | Freq: Every day | ORAL | 1 refills | Status: DC
Start: 2023-01-22 — End: 2023-04-22

## 2023-01-22 MED ORDER — BENAZEPRIL HCL 40 MG PO TABS
40.0000 mg | ORAL_TABLET | Freq: Every day | ORAL | 1 refills | Status: DC
Start: 2023-01-22 — End: 2023-09-10

## 2023-01-22 MED ORDER — TIRZEPATIDE 2.5 MG/0.5ML ~~LOC~~ SOAJ
2.5000 mg | SUBCUTANEOUS | 0 refills | Status: DC
Start: 2023-01-22 — End: 2023-02-16

## 2023-01-22 NOTE — Assessment & Plan Note (Signed)
 BP Readings from Last 1 Encounters:  01/22/23 123/65   Well-controlled with benazepril 40 mg daily and amlodipine 10 mg daily Counseled for compliance with the medications Advised DASH diet and moderate exercise/walking, at least 150 mins/week

## 2023-01-22 NOTE — Assessment & Plan Note (Signed)
 Sleep hygiene material provided Has trazodone 50 mg at bedtime PRN

## 2023-01-22 NOTE — Patient Instructions (Signed)
 Please start taking Mounjaro  2.5 mg once weekly. Please contact us  for higher dose after 1 month if you tolerate it.  Please start taking Atorvastatin  for cholesterol.  Please continue to take other medications as prescribed.  Please continue to follow low carb diet and perform moderate exercise/walking at least 150 mins/week.  Please get fasting blood tests done before the next visit.

## 2023-01-22 NOTE — Assessment & Plan Note (Signed)
 LDL above goal Needs to take Lipitor regularly, refilled

## 2023-01-22 NOTE — Progress Notes (Signed)
 Established Patient Office Visit  Subjective:  Patient ID: Bryan Wright, male    DOB: 08/28/1955  Age: 68 y.o. MRN: [id number]  CC:  Chief Complaint  Patient presents with   Hypertension    Three month follow up    Diabetes    Three month follow up     HPI Bryan Wright is a 68 y.o. male with past medical history of hypertension, type 2 diabetes mellitus, goiter and chronic pain syndrome 2/2 to lumbar spondylosis who presents for f/u of his chronic medical conditions.  HTN: His blood pressure is wnl today, but better than prior.  He takes benazepril  40 mg daily and amlodipine  10 mg QD.  Denies any headache, dizziness, chest pain, dyspnea or palpitations currently.  Type II DM: Used to be followed by endocrinology.  He takes Metformin  1000 mg twice daily, glipizide  10 mg daily. His last HbA1c was 9.0 in 12/24.  Denies any polyuria or polyphagia.  He admits that he has not been watching over his diet recently.  His cholesterol level was elevated.  He is noncompliant to atorvastatin .  He takes Norco 10-325 mg PRN for severe pain and has fentanyl patch for severe low back pain, managed by [facility] pain clinic  -[facility] in [place].  He reports erectile dysfunction and has used sildenafil , requests refills.  Denies any dysuria, hematuria, urinary hesitancy or urethral discharge currently.  Insomnia: He also reports difficulty maintaining sleep.  He had been feeling anxiety since losing his wife last year.  Denies any SI or HI currently.  He tried trazodone , which improved his sleep, but takes it PRN now.  Past Medical History:  Diagnosis Date   Diabetes mellitus dx in 2008   Hyperlipidemia    Hypertension 2008    Past Surgical History:  Procedure Laterality Date   APPENDECTOMY     early 20's   CATARACT EXTRACTION W/PHACO Left 01/15/2022   Procedure: CATARACT EXTRACTION PHACO AND INTRAOCULAR LENS PLACEMENT (IOC);  Surgeon: Juli Blunt, MD;  Location:  [facility];  Service: Ophthalmology;  Laterality: Left;  CDE: 4.00   CERVICAL DISC SURGERY     LUNG SURGERY Bilateral    inserted chest tube   SPINE SURGERY  01/20/2000   cervical  2002   SPINE SURGERY  2009, 2010   lumbar    History reviewed. No pertinent family history.  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  Tobacco Use   Smoking status: Former    Current packs/day: 0.00    Average packs/day: 1.5 packs/day for 5.0 years (7.5 ttl pk-yrs)    Types: Cigarettes    Start date: 90    Quit date: 1983    Years since quitting: 42.0   Smokeless tobacco: Former    Types: Chew    Quit date: 06/18/2015  Vaping Use   Vaping status: Never Used  Substance and Sexual Activity   Alcohol use: No    Alcohol/week: 0.0 standard drinks of alcohol   Drug use: No   Sexual activity: Yes    Birth control/protection: None  Other Topics Concern   Not on file  Social History Narrative   Lives with wife (in hospital currently rehab)      Two dogs: Precious and  Sebastian    Lots of rabbits running loose      Son and two grandkids live behind you       Enjoys outside, visit friends,      Diet: not good, eats meats, fast food since wife has been sick   Caffeine: diet soda-usually caffeine free, unsweet tea   Water : 2-3 bottles daily       Wears seat belt   Smoke detectors at home   Does not use phone while driving    Social Drivers of Health   Financial Resource Strain: Low Risk  (12/15/2021)   Overall Financial Resource Strain (CARDIA)    Difficulty of Paying Living Expenses: Not hard at all  Food Insecurity: No Food Insecurity (12/15/2021)   Hunger Vital Sign    Worried About Running Out of Food in the Last Year: Never true    Ran Out of Food in the Last Year: Never true   Transportation Needs: No Transportation Needs (12/15/2021)   PRAPARE - Administrator, Civil Service (Medical): No    Lack of Transportation (Non-Medical): No  Physical Activity: Insufficiently Active (12/09/2020)   Exercise Vital Sign    Days of Exercise per Week: 2 days    Minutes of Exercise per Session: 30 min  Stress: No Stress Concern Present (12/15/2021)   [employer] of Occupational Health - Occupational Stress Questionnaire    Feeling of Stress : Not at all  Social Connections: Moderately Isolated (12/15/2021)   Social Connection and Isolation Panel [NHANES]    Frequency of Communication with Friends and Family: More than three times a week    Frequency of Social Gatherings with Friends and Family: More than three times a week    Attends Religious Services: Never    Database Administrator or Organizations: No    Attends Banker Meetings: Never    Marital Status: Married  Catering Manager Violence: Unknown (04/22/2021)   Received from [employer], [facility]   HITS    Physically Hurt: Not on file    Insult or Talk Down To: Not on file    Threaten Physical Harm: Not on file    Scream or Curse: Not on file    Outpatient Medications Prior to Visit  Medication Sig Dispense Refill   amLODipine  (NORVASC ) 10 MG tablet Take 1 tablet (10 mg total) by mouth daily. 90 tablet 1   blood glucose meter kit and supplies 1 each by Other route as directed. Dispense based on patient and insurance preference. Use  four times daily as directed. (FOR ICD-10 E10.9, E11.9). 1 each 0   Blood Glucose Monitoring Suppl (ONETOUCH VERIO FLEX SYSTEM) w/Device KIT Use to test blood glucose twice a day-daily before breakfast and at bedtime. 1 kit 1   cyclobenzaprine  (FLEXERIL ) 10 MG tablet Take 1 tablet (10 mg total) by mouth 2 (two) times daily as needed for muscle spasms. 20 tablet 0   fentaNYL (DURAGESIC) 25 MCG/HR Place 1 patch onto the skin every 3 (three) days.      glipiZIDE  (GLUCOTROL  XL) 10 MG 24 hr tablet Take 1 tablet (10 mg total) by mouth daily with breakfast. 90 tablet 1   glucose blood (ONETOUCH VERIO) test strip USE TO TEST BLOOD SUGAR THREE TIMES DAILY 150 strip 1   HYDROcodone-acetaminophen (NORCO) 10-325 MG tablet Take 1 tablet by mouth every 6 (six) hours as needed for moderate pain or severe pain.     ibuprofen  (ADVIL ) 600 MG tablet  Take 1 tablet (600 mg total) by mouth every 8 (eight) hours as needed. 30 tablet 0   metFORMIN  (GLUCOPHAGE ) 1000 MG tablet Take 1 tablet (1,000 mg total) by mouth 2 (two) times daily with a meal. 180 tablet 1   Multiple Vitamin (THERA) TABS Take 2 tablets by mouth daily.     OneTouch Delica Lancets 33G MISC Use to test blood glucose twice a day-daily before breakfast and at bedtime. 200 each 1   sildenafil  (VIAGRA ) 25 MG tablet Take 1 tablet (25 mg total) by mouth daily as needed for erectile dysfunction. 30 tablet 1   traZODone  (DESYREL ) 50 MG tablet Take 0.5-1 tablets (25-50 mg total) by mouth at bedtime as needed for sleep. 30 tablet 1   UNABLE TO FIND One touch meter and lancets Once daily testing dx e11.9 50 each 5   atorvastatin  (LIPITOR) 10 MG tablet Take 1 tablet (10 mg total) by mouth at bedtime. 90 tablet 0   benazepril  (LOTENSIN ) 40 MG tablet TAKE 1 TABLET(40 MG) BY MOUTH DAILY 90 tablet 1   No facility-administered medications prior to visit.    No Known Allergies  ROS Review of Systems  Constitutional:  Negative for chills and fever.  HENT:  Negative for congestion and sore throat.   Eyes:  Negative for pain and discharge.  Respiratory:  Negative for cough and shortness of breath.   Cardiovascular:  Negative for chest pain and palpitations.  Gastrointestinal:  Negative for constipation, diarrhea, nausea and vomiting.  Endocrine: Negative for polydipsia and polyuria.  Genitourinary:  Negative for dysuria and hematuria.  Musculoskeletal:  Positive for arthralgias (R shoulder) and back pain.  Negative for neck pain and neck stiffness.  Skin:  Negative for rash.  Neurological:  Negative for dizziness, weakness, numbness and headaches.  Psychiatric/Behavioral:  Positive for sleep disturbance. Negative for agitation and behavioral problems. The patient is nervous/anxious.       Objective:    Physical Exam Vitals reviewed.  Constitutional:      General: He is not in acute distress.    Appearance: He is obese. He is not diaphoretic.  HENT:     Head: Normocephalic and atraumatic.     Nose: Nose normal.     Mouth/Throat:     Mouth: Mucous membranes are moist.  Eyes:     General: No scleral icterus.    Extraocular Movements: Extraocular movements intact.  Cardiovascular:     Rate and Rhythm: Normal rate and regular rhythm.     Pulses: Normal pulses.     Heart sounds: Normal heart sounds. No murmur heard. Pulmonary:     Breath sounds: Normal breath sounds. No wheezing or rales.  Musculoskeletal:     Cervical back: Neck supple. No tenderness.     Right lower leg: No edema.     Left lower leg: No edema.  Skin:    General: Skin is warm.     Findings: No rash.  Neurological:     General: No focal deficit present.     Mental Status: He is alert and oriented to person, place, and time.     Cranial Nerves: No cranial nerve deficit.     Sensory: No sensory deficit.  Psychiatric:        Mood and Affect: Mood normal.        Behavior: Behavior normal.     BP 123/65 (BP Location: Left Arm, Patient Position: Sitting, Cuff Size: Large)   Pulse 79   Ht 5' 10.5 (1.791  m)   Wt 253 lb 12.8 oz (115.1 kg)   SpO2 90%   BMI 35.90 kg/m  Wt Readings from Last 3 Encounters:  01/22/23 253 lb 12.8 oz (115.1 kg)  10/23/22 251 lb (113.9 kg)  09/30/22 255 lb (115.7 kg)    Lab Results  Component Value Date   TSH 1.290 01/19/2023   Lab Results  Component Value Date   WBC 9.8 08/16/2020   HGB 14.5 08/16/2020   HCT 42.6 08/16/2020   MCV 90 08/16/2020   PLT 205 08/16/2020    Lab Results  Component Value Date   NA 137 01/19/2023   K 4.6 01/19/2023   CO2 22 01/19/2023   GLUCOSE 235 (H) 01/19/2023   BUN 16 01/19/2023   CREATININE 0.92 01/19/2023   BILITOT 0.5 01/19/2023   ALKPHOS 75 01/19/2023   AST 24 01/19/2023   ALT 15 01/19/2023   PROT 7.0 01/19/2023   ALBUMIN 4.4 01/19/2023   CALCIUM  9.4 01/19/2023   ANIONGAP 7 07/21/2020   EGFR 92 01/19/2023   Lab Results  Component Value Date   CHOL 174 01/19/2023   Lab Results  Component Value Date   HDL 44 01/19/2023   Lab Results  Component Value Date   LDLCALC 116 (H) 01/19/2023   Lab Results  Component Value Date   TRIG 75 01/19/2023   Lab Results  Component Value Date   CHOLHDL 4.0 01/19/2023   Lab Results  Component Value Date   HGBA1C 9.0 (H) 01/19/2023      Assessment & Plan:   Problem List Items Addressed This Visit       Cardiovascular and Mediastinum   Essential hypertension, benign   BP Readings from Last 1 Encounters:  01/22/23 123/65   Well-controlled with benazepril  40 mg daily and amlodipine  10 mg daily Counseled for compliance with the medications Advised DASH diet and moderate exercise/walking, at least 150 mins/week      Relevant Medications   benazepril  (LOTENSIN ) 40 MG tablet   atorvastatin  (LIPITOR) 10 MG tablet     Endocrine   Type 2 diabetes mellitus with hyperglycemia, without long-term current use of insulin (HCC) - Primary   Lab Results  Component Value Date   HGBA1C 9.0 (H) 01/19/2023   Uncontrolled On Metformin  1000 mg twice daily and Glipizide  10 mg once daily - refilled Did not tolerate Jardiance , had urinary frequency Started Mounjaro  2.5 mg qw - plan to increase dose as tolerated Advised to follow diabetic diet On statin and ACEi F/u CMP, HbA1c Diabetic eye exam: Advised to follow up with Ophthalmology for diabetic eye exam      Relevant Medications   tirzepatide  (MOUNJARO ) 2.5 MG/0.5ML Pen   benazepril  (LOTENSIN ) 40 MG tablet    atorvastatin  (LIPITOR) 10 MG tablet   Other Relevant Orders   Urine Microalbumin w/creat. ratio   CMP14+EGFR   Hemoglobin A1c     Other   Morbid obesity (HCC)   BMI Readings from Last 3 Encounters:  01/22/23 35.90 kg/m  10/23/22 35.51 kg/m  09/30/22 36.07 kg/m   Associated with HTN, DM and HLD Advised to follow low-carb diet and perform moderate exercise/walking at least 150 minutes/week Started Mounjaro  for type II DM      Relevant Medications   tirzepatide  (MOUNJARO ) 2.5 MG/0.5ML Pen   Mixed hyperlipidemia   LDL above goal Needs to take Lipitor regularly, refilled      Relevant Medications   benazepril  (LOTENSIN ) 40 MG tablet   atorvastatin  (LIPITOR) 10 MG tablet  Primary insomnia   Sleep hygiene material provided Has trazodone  50 mg at bedtime PRN      Other Visit Diagnoses       Encounter for immunization       Relevant Orders   Flu Vaccine Trivalent High Dose (Fluad) (Completed)         Meds ordered this encounter  Medications   tirzepatide  (MOUNJARO ) 2.5 MG/0.5ML Pen    Sig: Inject 2.5 mg into the skin once a week.    Dispense:  2 mL    Refill:  0   benazepril  (LOTENSIN ) 40 MG tablet    Sig: Take 1 tablet (40 mg total) by mouth daily.    Dispense:  90 tablet    Refill:  1   atorvastatin  (LIPITOR) 10 MG tablet    Sig: Take 1 tablet (10 mg total) by mouth at bedtime.    Dispense:  90 tablet    Refill:  1    Follow-up: Return in about 3 months (around 04/22/2023) for DM.    Suzzane MARLA Blanch, MD

## 2023-01-22 NOTE — Assessment & Plan Note (Addendum)
 Lab Results  Component Value Date   HGBA1C 9.0 (H) 01/19/2023   Uncontrolled On Metformin  1000 mg twice daily and Glipizide  10 mg once daily - refilled Did not tolerate Jardiance , had urinary frequency Started Mounjaro  2.5 mg qw - plan to increase dose as tolerated Advised to follow diabetic diet On statin and ACEi F/u CMP, HbA1c Diabetic eye exam: Advised to follow up with Ophthalmology for diabetic eye exam

## 2023-01-22 NOTE — Assessment & Plan Note (Addendum)
 BMI Readings from Last 3 Encounters:  01/22/23 35.90 kg/m  10/23/22 35.51 kg/m  09/30/22 36.07 kg/m   Associated with HTN, DM and HLD Advised to follow low-carb diet and perform moderate exercise/walking at least 150 minutes/week Started Mounjaro  for type II DM

## 2023-01-24 LAB — MICROALBUMIN / CREATININE URINE RATIO
Creatinine, Urine: 66.1 mg/dL
Microalb/Creat Ratio: 17 mg/g{creat} (ref 0–29)
Microalbumin, Urine: 11.4 ug/mL

## 2023-02-02 DIAGNOSIS — M961 Postlaminectomy syndrome, not elsewhere classified: Secondary | ICD-10-CM | POA: Diagnosis not present

## 2023-02-02 DIAGNOSIS — Z79899 Other long term (current) drug therapy: Secondary | ICD-10-CM | POA: Diagnosis not present

## 2023-02-02 DIAGNOSIS — G894 Chronic pain syndrome: Secondary | ICD-10-CM | POA: Diagnosis not present

## 2023-02-02 DIAGNOSIS — Z9689 Presence of other specified functional implants: Secondary | ICD-10-CM | POA: Diagnosis not present

## 2023-02-02 DIAGNOSIS — Z5181 Encounter for therapeutic drug level monitoring: Secondary | ICD-10-CM | POA: Diagnosis not present

## 2023-02-02 DIAGNOSIS — M5416 Radiculopathy, lumbar region: Secondary | ICD-10-CM | POA: Diagnosis not present

## 2023-02-16 ENCOUNTER — Other Ambulatory Visit: Payer: Self-pay | Admitting: Internal Medicine

## 2023-02-16 DIAGNOSIS — E1165 Type 2 diabetes mellitus with hyperglycemia: Secondary | ICD-10-CM

## 2023-02-16 NOTE — Telephone Encounter (Unsigned)
Copied from CRM [id number]. Topic: General - Other >> Feb 16, 2023  1:34 PM Carlatta H wrote: Reason for CRM: Patient stated that Doctor Allena Katz wanted to know how medication was effecting the patient//Patient stated everything is going well with tirzepatide [facility]) 2.5 MG/0.5ML Pen [[id number]]

## 2023-02-16 NOTE — Telephone Encounter (Unsigned)
Copied from CRM [id number]. Topic: Clinical - Medication Refill >> Feb 16, 2023  1:32 PM Carlatta H wrote: Most Recent Primary Care Visit:  Provider: Anabel Halon  Department: [facility]  Visit Type: OFFICE VISIT  Date: 01/22/2023  Medication: tirzepatide [facility]) 2.5 MG/0.5ML Pen [[id number]]  Has the patient contacted their pharmacy? No (Agent: If no, request that the patient contact the pharmacy for the refill. If patient does not wish to contact the pharmacy document the reason why and proceed with request.) (Agent: If yes, when and what did the pharmacy advise?)  Is this the correct pharmacy for this prescription? Yes If no, delete pharmacy and type the correct one.  This is the patient's preferred pharmacy:  [id number] - [place], [place] - [address] [place] [address] Phone: [phone] Fax: [phone]   Has the prescription been filled recently? No  Is the patient out of the medication? Yes  Has the patient been seen for an appointment in the last year OR does the patient have an upcoming appointment? Yes  Can we respond through MyChart? No  Agent: Please be advised that Rx refills may take up to 3 business days. We ask that you follow-up with your pharmacy.
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
## 2023-03-02 ENCOUNTER — Ambulatory Visit: Payer: Medicare HMO | Admitting: Orthopedic Surgery

## 2023-03-02 ENCOUNTER — Encounter: Payer: Self-pay | Admitting: Orthopedic Surgery

## 2023-03-02 VITALS — BP 147/70 | HR 76 | Ht 70.0 in | Wt 250.0 lb

## 2023-03-02 DIAGNOSIS — M25511 Pain in right shoulder: Secondary | ICD-10-CM | POA: Diagnosis not present

## 2023-03-02 DIAGNOSIS — G8929 Other chronic pain: Secondary | ICD-10-CM

## 2023-03-02 MED ORDER — METHYLPREDNISOLONE ACETATE 40 MG/ML IJ SUSP
40.0000 mg | Freq: Once | INTRAMUSCULAR | Status: AC
Start: 2023-03-02 — End: 2023-03-02
  Administered 2023-03-02: 40 mg via INTRA_ARTICULAR

## 2023-03-02 NOTE — Patient Instructions (Signed)

## 2023-03-02 NOTE — Addendum Note (Signed)
Addended by: Michaele Offer on: 03/02/2023 10:09 AM   Modules accepted: Orders

## 2023-03-02 NOTE — Progress Notes (Signed)
Return Patient Visit  Assessment: Bryan Wright is a 68 y.o. male with the following: 1.  Acute right shoulder pain   Plan: Bryan Wright has recurrence of pain in the right shoulder.  He states it is bothered him for the past month.  Prior injection was very beneficial.  He would like to proceed with another injection today.  This was completed without issues.  He will return to clinic as needed.  Procedure note injection - Right shoulder    Verbal consent was obtained to inject the right shoulder, subacromial space Timeout was completed to confirm the site of injection.   The skin was prepped with alcohol and ethyl chloride was sprayed at the injection site.  A 21-gauge needle was used to inject 40 mg of Depo-Medrol and 1% lidocaine (4 cc) into the subacromial space of the right shoulder using a posterolateral approach.  There were no complications.  A sterile bandage was applied.    Follow-up: Return if symptoms worsen or fail to improve.  Subjective:  Chief Complaint  Patient presents with   Shoulder Pain    Patient having pain in right shoulder  had injection in September would like injection today     History of Present Illness: Bryan Wright is a 68 y.o. male who returns to clinic for repeat evaluation of right shoulder pain.  I saw him in clinic several months ago.  At that time, his right shoulder was injected.  He did well.  He had improvement in his symptoms until about a month ago.  Nothing specific has happened recently.  He does state that about a week ago, he had very little motion of the right shoulder due to pain.  He would like another injection today.  Review of Systems: No fevers or chills No numbness or tingling No chest pain No shortness of breath No bowel or bladder dysfunction No GI distress No headaches    Objective: BP (!) 147/70   Pulse 76   Ht 5\' 10"  (1.778 m)   Wt 250 lb (113.4 kg)   BMI 35.87 kg/m   Physical  Exam:  General: Alert and oriented. and No acute distress. Gait: Normal gait.  Right shoulder without deformity.  Tenderness palpation over the anterior shoulder.  He has 170 degrees of forward flexion.   Internal rotation to his lumbar spine.  45 degrees of external rotation at his side.  Positive Jobe's.  Pain in the empty can testing position.  Positive O'Brien's.  Negative belly press.  Fingers are warm and well-perfused.  IMAGING: No new imaging obtained today    New Medications:  No orders of the defined types were placed in this encounter.     [name], MD  03/02/2023 9:43 AM

## 2023-03-19 ENCOUNTER — Other Ambulatory Visit: Payer: Self-pay | Admitting: Internal Medicine

## 2023-03-19 DIAGNOSIS — E1165 Type 2 diabetes mellitus with hyperglycemia: Secondary | ICD-10-CM

## 2023-03-22 ENCOUNTER — Other Ambulatory Visit: Payer: Self-pay | Admitting: Internal Medicine

## 2023-03-22 DIAGNOSIS — E1165 Type 2 diabetes mellitus with hyperglycemia: Secondary | ICD-10-CM

## 2023-03-22 NOTE — Telephone Encounter (Signed)
 Copied from CRM [id number]. Topic: Clinical - Medication Refill >> Mar 22, 2023 10:44 AM Maryann Alar wrote: Most Recent Primary Care Visit:  Provider: Anabel Halon  Department: [facility]  Visit Type: OFFICE VISIT  Date: 01/22/2023  Medication: MOUNJARO 2.5 MG/0.5ML Pen  Has the patient contacted their pharmacy? Yes (Agent: If no, request that the patient contact the pharmacy for the refill. If patient does not wish to contact the pharmacy document the reason why and proceed with request.) (Agent: If yes, when and what did the pharmacy advise?)  Is this the correct pharmacy for this prescription? Yes If no, delete pharmacy and type the correct one.  This is the patient's preferred pharmacy:  [id number] - [place], [place] - [address] [place] [address] Phone: [phone] Fax: [phone]   Has the prescription been filled recently? No  Is the patient out of the medication? Yes  Has the patient been seen for an appointment in the last year OR does the patient have an upcoming appointment? Yes  Can we respond through MyChart? No  Agent: Please be advised that Rx refills may take up to 3 business days. We ask that you follow-up with your pharmacy.

## 2023-03-23 ENCOUNTER — Other Ambulatory Visit: Payer: Self-pay | Admitting: Internal Medicine

## 2023-03-23 DIAGNOSIS — E1165 Type 2 diabetes mellitus with hyperglycemia: Secondary | ICD-10-CM

## 2023-03-23 NOTE — Telephone Encounter (Signed)
 Copied from CRM [id number]. Topic: Clinical - Prescription Issue >> Mar 23, 2023 12:28 PM Gildardo Pounds wrote: Reason for CRM: Patient needs a prior authorization for [facility] 2.5 MG/0.5ML Pen. Patient also needs to know if missing an injection will cause an issue. Callback number is [phone]

## 2023-03-30 DIAGNOSIS — Z9689 Presence of other specified functional implants: Secondary | ICD-10-CM | POA: Diagnosis not present

## 2023-03-30 DIAGNOSIS — G894 Chronic pain syndrome: Secondary | ICD-10-CM | POA: Diagnosis not present

## 2023-03-30 DIAGNOSIS — M961 Postlaminectomy syndrome, not elsewhere classified: Secondary | ICD-10-CM | POA: Diagnosis not present

## 2023-03-30 DIAGNOSIS — M5416 Radiculopathy, lumbar region: Secondary | ICD-10-CM | POA: Diagnosis not present

## 2023-04-01 ENCOUNTER — Other Ambulatory Visit: Payer: Self-pay | Admitting: Internal Medicine

## 2023-04-01 DIAGNOSIS — E1165 Type 2 diabetes mellitus with hyperglycemia: Secondary | ICD-10-CM

## 2023-04-01 NOTE — Telephone Encounter (Unsigned)
 Copied from CRM [id number]. Topic: Clinical - Medication Refill >> Apr 01, 2023  3:22 PM Tiffany S wrote: Most Recent Primary Care Visit:  Provider: Anabel Halon  Department: [facility]  Visit Type: OFFICE VISIT  Date: 01/22/2023  Medication: MOUNJARO 2.5 MG/0.5ML Pen [  Has the patient contacted their pharmacy? Yes (Agent: If no, request that the patient contact the pharmacy for the refill. If patient does not wish to contact the pharmacy document the reason why and proceed with request.) (Agent: If yes, when and what did the pharmacy advise?)  Is this the correct pharmacy for this prescription? Yes If no, delete pharmacy and type the correct one.  This is the patient's preferred pharmacy:  [id number] - [place], [place] - [address]  [place] [address] Phone: [phone] Fax: [phone]   Has the prescription been filled recently? Yes  Is the patient out of the medication? Yes  Has the patient been seen for an appointment in the last year OR does the patient have an upcoming appointment? Yes  Can we respond through MyChart? No  Agent: Please be advised that Rx refills may take up to 3 business days. We ask that you follow-up with your pharmacy.   Patient stated doctor got medicine approved with insurance company requesting call back  [facility] in  [place] has it instock

## 2023-04-02 MED ORDER — MOUNJARO 2.5 MG/0.5ML ~~LOC~~ SOAJ: 2.5000 mg | SUBCUTANEOUS | 0 refills | Status: DC

## 2023-04-06 ENCOUNTER — Ambulatory Visit (INDEPENDENT_AMBULATORY_CARE_PROVIDER_SITE_OTHER): Payer: Medicare HMO

## 2023-04-06 VITALS — BP 132/80 | Ht 70.0 in | Wt 250.0 lb

## 2023-04-06 DIAGNOSIS — Z1211 Encounter for screening for malignant neoplasm of colon: Secondary | ICD-10-CM

## 2023-04-06 DIAGNOSIS — Z0000 Encounter for general adult medical examination without abnormal findings: Secondary | ICD-10-CM

## 2023-04-06 DIAGNOSIS — Z5320 Procedure and treatment not carried out because of patient's decision for unspecified reasons: Secondary | ICD-10-CM

## 2023-04-06 DIAGNOSIS — Z2821 Immunization not carried out because of patient refusal: Secondary | ICD-10-CM

## 2023-04-06 NOTE — Progress Notes (Signed)
 Because this visit was a virtual/telehealth visit,  certain criteria was not obtained, such a blood pressure, CBG if applicable, and timed get up and go. Any medications not marked as "taking" were not mentioned during the medication reconciliation part of the visit. Any vitals not documented were not able to be obtained due to this being a telehealth visit or patient was unable to self-report a recent blood pressure reading due to a lack of equipment at home via telehealth. Vitals that have been documented are verbally provided by the patient.   Subjective:   Bryan Wright is a 68 y.o. who presents for a Medicare Wellness preventive visit.  Visit Complete: Virtual I connected with  Bryan Wright on 04/06/23 by a audio enabled telemedicine application and verified that I am speaking with the correct person using two identifiers.  Patient Location: Home  Provider Location: Home Office  I discussed the limitations of evaluation and management by telemedicine. The patient expressed understanding and agreed to proceed.  Vital Signs: Because this visit was a virtual/telehealth visit, some criteria may be missing or patient reported. Any vitals not documented were not able to be obtained and vitals that have been documented are patient reported.  VideoDeclined- This patient declined Librarian, academic. Therefore the visit was completed with audio only.  Persons Participating in Visit: Patient.  AWV Questionnaire: No: Patient Medicare AWV questionnaire was not completed prior to this visit.  Cardiac Risk Factors include: advanced age (>71men, >71 women);male gender;hypertension;diabetes mellitus;obesity (BMI >30kg/m2)     Objective:    Today's Vitals   04/06/23 1408  BP: 132/80  Weight: 250 lb (113.4 kg)  Height: 5\' 10"  (1.778 m)   Body mass index is 35.87 kg/m.     04/06/2023    2:20 PM 01/08/2022    3:49 PM 12/15/2021    3:08 PM 12/09/2020     4:21 PM 07/21/2020    6:02 PM 12/06/2019    9:24 AM 04/15/2016    2:33 PM  Advanced Directives  Does Patient Have a Medical Advance Directive? No No No No No No No  Would patient like information on creating a medical advance directive? No - Patient declined No - Patient declined Yes (ED - Information included in AVS) No - Patient declined No - Patient declined No - Patient declined No - Patient declined    Current Medications (verified) Outpatient Encounter Medications as of 04/06/2023  Medication Sig   amLODipine (NORVASC) 10 MG tablet Take 1 tablet (10 mg total) by mouth daily.   atorvastatin (LIPITOR) 10 MG tablet Take 1 tablet (10 mg total) by mouth at bedtime.   fentaNYL (DURAGESIC) 25 MCG/HR Place 1 patch onto the skin every 3 (three) days.   glipiZIDE (GLUCOTROL XL) 10 MG 24 hr tablet Take 1 tablet (10 mg total) by mouth daily with breakfast.   HYDROcodone-acetaminophen (NORCO) 10-325 MG tablet Take 1 tablet by mouth every 6 (six) hours as needed for moderate pain or severe pain.   ibuprofen (ADVIL) 600 MG tablet Take 1 tablet (600 mg total) by mouth every 8 (eight) hours as needed.   metFORMIN (GLUCOPHAGE) 1000 MG tablet Take 1 tablet (1,000 mg total) by mouth 2 (two) times daily with a meal.   Multiple Vitamin (THERA) TABS Take 2 tablets by mouth daily.   OneTouch Delica Lancets 33G MISC Use to test blood glucose twice a day-daily before breakfast and at bedtime.   sildenafil (VIAGRA) 25 MG tablet Take 1 tablet (  25 mg total) by mouth daily as needed for erectile dysfunction.   tirzepatide [facility]) 2.5 MG/0.5ML Pen Inject 2.5 mg into the skin once a week.   traZODone (DESYREL) 50 MG tablet Take 0.5-1 tablets (25-50 mg total) by mouth at bedtime as needed for sleep.   UNABLE TO FIND One touch meter and lancets Once daily testing dx e11.9   benazepril (LOTENSIN) 40 MG tablet Take 1 tablet (40 mg total) by mouth daily.   blood glucose meter kit and supplies 1 each by Other route as  directed. Dispense based on patient and insurance preference. Use  four times daily as directed. (FOR ICD-10 E10.9, E11.9).   Blood Glucose Monitoring Suppl (ONETOUCH VERIO FLEX SYSTEM) w/Device KIT Use to test blood glucose twice a day-daily before breakfast and at bedtime.   cyclobenzaprine (FLEXERIL) 10 MG tablet Take 1 tablet (10 mg total) by mouth 2 (two) times daily as needed for muscle spasms. (Patient not taking: Reported on 04/06/2023)   glucose blood (ONETOUCH VERIO) test strip USE TO TEST BLOOD SUGAR THREE TIMES DAILY   No facility-administered encounter medications on file as of 04/06/2023.    Allergies (verified) Patient has no known allergies.   History: Past Medical History:  Diagnosis Date   Diabetes mellitus dx in 2008   Hyperlipidemia    Hypertension 2008   Past Surgical History:  Procedure Laterality Date   APPENDECTOMY     early 20's   CATARACT EXTRACTION W/PHACO Left 01/15/2022   Procedure: CATARACT EXTRACTION PHACO AND INTRAOCULAR LENS PLACEMENT (IOC);  Surgeon: Pecolia Ades, MD;  Location: [facility];  Service: Ophthalmology;  Laterality: Left;  CDE: 4.00   CERVICAL DISC SURGERY     LUNG SURGERY Bilateral    inserted chest tube   SPINE SURGERY  01/20/2000   cervical  2002   SPINE SURGERY  2009, 2010   lumbar   History reviewed. No pertinent family history. 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  Tobacco Use   Smoking status: Former    Current packs/day: 0.00    Average packs/day: 1.5 packs/day for 5.0 years (7.5 ttl pk-yrs)    Types: Cigarettes    Start date: 57    Quit date: 1983    Years since quitting: 42.2   Smokeless tobacco: Former    Types: Chew    Quit date: 06/18/2015  Vaping Use   Vaping status: Never Used  Substance  and Sexual Activity   Alcohol use: No    Alcohol/week: 0.0 standard drinks of alcohol   Drug use: No   Sexual activity: Yes    Birth control/protection: None  Other Topics Concern   Not on file  Social History Narrative   Lives with wife (in hospital currently rehab)      Two dogs: Precious and [place]    Lots of rabbits running loose      Son and two grandkids live behind you       Enjoys outside, visit friends,      Diet: not good, eats meats, fast food since wife has been sick   Caffeine: diet soda-usually caffeine free, unsweet tea   Water: 2-3 bottles daily       Wears seat belt   Smoke detectors at home  Does not use phone while driving    Social Drivers of Health   Financial Resource Strain: Low Risk  (04/06/2023)   Overall Financial Resource Strain (CARDIA)    Difficulty of Paying Living Expenses: Not very hard  Food Insecurity: No Food Insecurity (04/06/2023)   Hunger Vital Sign    Worried About Running Out of Food in the Last Year: Never true    Ran Out of Food in the Last Year: Never true  Transportation Needs: No Transportation Needs (04/06/2023)   PRAPARE - Administrator, Civil Service (Medical): No    Lack of Transportation (Non-Medical): No  Physical Activity: Insufficiently Active (04/06/2023)   Exercise Vital Sign    Days of Exercise per Week: 1 day    Minutes of Exercise per Session: 20 min  Stress: No Stress Concern Present (04/06/2023)   [employer] of Occupational Health - Occupational Stress Questionnaire    Feeling of Stress : Not at all  Social Connections: Moderately Integrated (04/06/2023)   Social Connection and Isolation Panel [NHANES]    Frequency of Communication with Friends and Family: More than three times a week    Frequency of Social Gatherings with Friends and Family: More than three times a week    Attends Religious Services: Never    Database administrator or Organizations: Yes    Attends Banker  Meetings: Never    Marital Status: Married    Tobacco Counseling Counseling given: Not Answered    Clinical Intake:  Pre-visit preparation completed: Yes  Pain : No/denies pain     BMI - recorded: 35.87 Nutritional Status: BMI > 30  Obese Nutritional Risks: None Diabetes: Yes CBG done?: No Did pt. bring in CBG monitor from home?: No  How often do you need to have someone help you when you read instructions, pamphlets, or other written materials from your doctor or pharmacy?: 1 - Never  Interpreter Needed?: No  Information entered by :: Estell Harpin   Activities of Daily Living      04/06/2023    2:18 PM  In your present state of health, do you have any difficulty performing the following activities:  Hearing? 0  Vision? 0  Difficulty concentrating or making decisions? 0  Walking or climbing stairs? 0  Dressing or bathing? 0  Doing errands, shopping? 0  Preparing Food and eating ? N  Using the Toilet? N  In the past six months, have you accidently leaked urine? N  Do you have problems with loss of bowel control? N  Managing your Medications? N  Managing your Finances? N  Housekeeping or managing your Housekeeping? N    Patient Care Team: Anabel Halon, MD as PCP - General (Internal Medicine) Jethro Bolus, MD as Consulting Physician (Ophthalmology) Cannon Kettle, MD as Referring Physician (Pain Medicine)  Indicate any recent Medical Services you may have received from other than [facility] providers in the past year (date may be approximate).     Assessment:   This is a routine wellness examination for Bryan Wright.  Hearing/Vision screen Hearing Screening - Comments:: No problems with hearing   Goals Addressed             This Visit's Progress    Patient Stated       Patient states he wants to start walk more       Depression Screen     04/06/2023    2:21 PM 01/22/2023   10:56 AM 10/23/2022  10:06 AM 09/15/2022    9:03 AM 12/15/2021     3:09 PM 07/14/2021   10:08 AM 05/28/2021    1:18 PM  PHQ 2/9 Scores  PHQ - 2 Score 0 0 0 0 0 0 0  PHQ- 9 Score 0          Fall Risk     04/06/2023    2:19 PM 01/22/2023   10:56 AM 10/23/2022   10:06 AM 09/15/2022    9:02 AM 12/15/2021    3:08 PM  Fall Risk   Falls in the past year? 0 0 0 0 0  Number falls in past yr: 0 0 0 0 0  Injury with Fall? 0 0 0 0 0  Risk for fall due to :  No Fall Risks   No Fall Risks  Follow up Falls evaluation completed Falls evaluation completed   Falls evaluation completed    MEDICARE RISK AT HOME:  Medicare Risk at Home Any stairs in or around the home?: No If so, are there any without handrails?: No Home free of loose throw rugs in walkways, pet beds, electrical cords, etc?: No Adequate lighting in your home to reduce risk of falls?: Yes Life alert?: No Use of a cane, walker or w/c?: No Grab bars in the bathroom?: No Shower chair or bench in shower?: No Elevated toilet seat or a handicapped toilet?: No  TIMED UP AND GO:  Was the test performed?  No  Cognitive Function: 6CIT completed        04/06/2023    2:14 PM 12/15/2021    3:10 PM 12/09/2020    4:24 PM 12/06/2019    9:29 AM 04/15/2016    2:34 PM  6CIT Screen  What Year? 0 points 0 points 0 points 0 points 0 points  What month? 0 points 0 points 0 points 0 points 0 points  What time? 0 points 0 points 0 points 0 points 0 points  Count back from 20 0 points 0 points 0 points 0 points 0 points  Months in reverse 4 points 0 points 0 points 0 points 0 points  Repeat phrase 2 points 2 points 0 points 0 points 0 points  Total Score 6 points 2 points 0 points 0 points 0 points    Immunizations Immunization History  Administered Date(s) Administered   Fluad Trivalent(High Dose 65+) 01/22/2023   Influenza Whole 12/02/2009   Influenza, Seasonal, Injecte, Preservative Fre 09/18/2013, 03/20/2015   Influenza,inj,Quad PF,6+ Mos 09/18/2013, 03/20/2015, 11/25/2016, 10/19/2017, 10/13/2018,  11/21/2019   PNEUMOCOCCAL CONJUGATE-20 09/10/2020   Pneumococcal Polysaccharide-23 12/02/2009, 03/20/2015    Screening Tests Health Maintenance  Topic Date Due   COVID-19 Vaccine (1) Never done   DTaP/Tdap/Td (1 - Tdap) Never done   Zoster Vaccines- Shingrix (1 of 2) Never done   Colonoscopy  Never done   OPHTHALMOLOGY EXAM  03/18/2022   FOOT EXAM  05/11/2023   HEMOGLOBIN A1C  07/19/2023   Diabetic kidney evaluation - eGFR measurement  01/19/2024   Diabetic kidney evaluation - Urine ACR  01/22/2024   Medicare Annual Wellness (AWV)  04/05/2024   Pneumonia Vaccine 58+ Years old  Completed   INFLUENZA VACCINE  Completed   Hepatitis C Screening  Completed   HIV Screening  Completed   HPV VACCINES  Aged Out    Health Maintenance  Health Maintenance Due  Topic Date Due   COVID-19 Vaccine (1) Never done   DTaP/Tdap/Td (1 - Tdap) Never done   Zoster Vaccines-  Shingrix (1 of 2) Never done   Colonoscopy  Never done   OPHTHALMOLOGY EXAM  03/18/2022   Health Maintenance Items Addressed: Cologuard Ordered  Additional Screening:  Vision Screening: Recommended annual ophthalmology exams for early detection of glaucoma and other disorders of the eye.  Dental Screening: Recommended annual dental exams for proper oral hygiene  Community Resource Referral / Chronic Care Management: CRR required this visit?  No   CCM required this visit?  No     Plan:     I have personally reviewed and noted the following in the patient's chart:   Medical and social history Use of alcohol, tobacco or illicit drugs  Current medications and supplements including opioid prescriptions. Patient is currently taking opioid prescriptions. Information provided to patient regarding non-opioid alternatives. Patient advised to discuss non-opioid treatment plan with their provider. Functional ability and status Nutritional status Physical activity Advanced directives List of other  physicians Hospitalizations, surgeries, and ER visits in previous 12 months Vitals Screenings to include cognitive, depression, and falls Referrals and appointments  In addition, I have reviewed and discussed with patient certain preventive protocols, quality metrics, and best practice recommendations. A written personalized care plan for preventive services as well as general preventive health recommendations were provided to patient.     Rudi Heap, [place]   04/06/2023   After Visit Summary: (MyChart) Due to this being a telephonic visit, the after visit summary with patients personalized plan was offered to patient via MyChart   Notes: Nothing significant to report at this time.

## 2023-04-06 NOTE — Patient Instructions (Signed)
 Managing Pain Without Opioids Opioids are strong medicines used to treat moderate to severe pain. For some people, especially those who have long-term (chronic) pain, opioids may not be the best choice for pain management due to: Side effects like nausea, constipation, and sleepiness. The risk of addiction (opioid use disorder). The longer you take opioids, the greater your risk of addiction. Pain that lasts for more than 3 months is called chronic pain. Managing chronic pain usually requires more than one approach and is often provided by a team of health care providers working together (multidisciplinary approach). Pain management may be done at a pain management center or pain clinic. How to manage pain without the use of opioids Use non-opioid medicines Non-opioid medicines for pain may include: Over-the-counter or prescription non-steroidal anti-inflammatory drugs (NSAIDs). These may be the first medicines used for pain. They work well for muscle and bone pain, and they reduce swelling. Acetaminophen. This over-the-counter medicine may work well for milder pain but not swelling. Antidepressants. These may be used to treat chronic pain. A certain type of antidepressant (tricyclics) is often used. These medicines are given in lower doses for pain than when used for depression. Anticonvulsants. These are usually used to treat seizures but may also reduce nerve (neuropathic) pain. Muscle relaxants. These relieve pain caused by sudden muscle tightening (spasms). You may also use a pain medicine that is applied to the skin as a patch, cream, or gel (topical analgesic), such as a numbing medicine. These may cause fewer side effects than medicines taken by mouth. Do certain therapies as directed Some therapies can help with pain management. They include: Physical therapy. You will do exercises to gain strength and flexibility. A physical therapist may teach you exercises to move and stretch parts of  your body that are weak, stiff, or painful. You can learn these exercises at physical therapy visits and practice them at home. Physical therapy may also involve: Massage. Heat wraps or applying heat or cold to affected areas. Electrical signals that interrupt pain signals (transcutaneous electrical nerve stimulation, TENS). Weak lasers that reduce pain and swelling (low-level laser therapy). Signals from your body that help you learn to regulate pain (biofeedback). Occupational therapy. This helps you to learn ways to function at home and work with less pain. Recreational therapy. This involves trying new activities or hobbies, such as a physical activity or drawing. Mental health therapy, including: Cognitive behavioral therapy (CBT). This helps you learn coping skills for dealing with pain. Acceptance and commitment therapy (ACT) to change the way you think and react to pain. Relaxation therapies, including muscle relaxation exercises and mindfulness-based stress reduction. Pain management counseling. This may be individual, family, or group counseling.  Receive medical treatments Medical treatments for pain management include: Nerve block injections. These may include a pain blocker and anti-inflammatory medicines. You may have injections: Near the spine to relieve chronic back or neck pain. Into joints to relieve back or joint pain. Into nerve areas that supply a painful area to relieve body pain. Into muscles (trigger point injections) to relieve some painful muscle conditions. A medical device placed near your spine to help block pain signals and relieve nerve pain or chronic back pain (spinal cord stimulation device). Acupuncture. Follow these instructions at home Medicines Take over-the-counter and prescription medicines only as told by your health care provider. If you are taking pain medicine, ask your health care providers about possible side effects to watch out for. Do not  drive or use heavy machinery  while taking prescription opioid pain medicine. Lifestyle  Do not use drugs or alcohol to reduce pain. If you drink alcohol, limit how much you have to: 0-1 drink a day for women who are not pregnant. 0-2 drinks a day for men. Know how much alcohol is in a drink. In the [place] one drink equals one 12 oz bottle of beer (355 mL), one 5 oz glass of wine (148 mL), or one 1 oz glass of hard liquor (44 mL). Do not use any products that contain nicotine or tobacco. These products include cigarettes, chewing tobacco, and vaping devices, such as e-cigarettes. If you need help quitting, ask your health care provider. Eat a healthy diet and maintain a healthy weight. Poor diet and excess weight may make pain worse. Eat foods that are high in fiber. These include fresh fruits and vegetables, whole grains, and beans. Limit foods that are high in fat and processed sugars, such as fried and sweet foods. Exercise regularly. Exercise lowers stress and may help relieve pain. Ask your health care provider what activities and exercises are safe for you. If your health care provider approves, join an exercise class that combines movement and stress reduction. Examples include yoga and tai chi. Get enough sleep. Lack of sleep may make pain worse. Lower stress as much as possible. Practice stress reduction techniques as told by your therapist. General instructions Work with all your pain management providers to find the treatments that work best for you. You are an important member of your pain management team. There are many things you can do to reduce pain on your own. Consider joining an online or in-person support group for people who have chronic pain. Keep all follow-up visits. This is important. Where to find more information You can find more information about managing pain without opioids from: American Academy of Pain Medicine: painmed.org Institute for Chronic Pain:  instituteforchronicpain.org American Chronic Pain Association: theacpa.org Contact a health care provider if: You have side effects from pain medicine. Your pain gets worse or does not get better with treatments or home therapy. You are struggling with anxiety or depression. Summary Many types of pain can be managed without opioids. Chronic pain may respond better to pain management without opioids. Pain is best managed when you and a team of health care providers work together. Pain management without opioids may include non-opioid medicines, medical treatments, physical therapy, mental health therapy, and lifestyle changes. Tell your health care providers if your pain gets worse or is not being managed well enough. This information is not intended to replace advice given to you by your health care provider. Make sure you discuss any questions you have with your health care provider. Document Revised: 04/17/2020 Document Reviewed: 04/17/2020 Elsevier Patient Education  2024 Elsevier Inc.Mr. Bryan Wright , Thank you for taking time to come for your Medicare Wellness Visit. I appreciate your ongoing commitment to your health goals. Please review the following plan we discussed and let me know if I can assist you in the future.   Referrals/Orders/Follow-Ups/Clinician Recommendations: Patient was told that he need covid, shingles and tdap vaccine, Also patient is due for an diabetic eye exam and cologuard which were ordered.  This is a list of the screening recommended for you and due dates:  Health Maintenance  Topic Date Due   COVID-19 Vaccine (1) Never done   DTaP/Tdap/Td vaccine (1 - Tdap) Never done   Zoster (Shingles) Vaccine (1 of 2) Never done   Colon Cancer Screening  Never done   Eye exam for diabetics  03/18/2022   Complete foot exam   05/11/2023   Hemoglobin A1C  07/19/2023   Yearly kidney function blood test for diabetes  01/19/2024   Yearly kidney health urinalysis for diabetes   01/22/2024   Medicare Annual Wellness Visit  04/05/2024   Pneumonia Vaccine  Completed   Flu Shot  Completed   Hepatitis C Screening  Completed   HIV Screening  Completed   HPV Vaccine  Aged Out    Advanced directives: (Declined) Advance directive discussed with you today. Even though you declined this today, please call our office should you change your mind, and we can give you the proper paperwork for you to fill out.  Next Medicare Annual Wellness Visit scheduled for next year: Yes

## 2023-04-22 ENCOUNTER — Ambulatory Visit: Payer: Medicare HMO | Admitting: Internal Medicine

## 2023-04-22 ENCOUNTER — Encounter: Payer: Self-pay | Admitting: Internal Medicine

## 2023-04-22 VITALS — BP 138/72 | HR 78 | Ht 70.5 in | Wt 253.4 lb

## 2023-04-22 DIAGNOSIS — M961 Postlaminectomy syndrome, not elsewhere classified: Secondary | ICD-10-CM

## 2023-04-22 DIAGNOSIS — F5101 Primary insomnia: Secondary | ICD-10-CM | POA: Diagnosis not present

## 2023-04-22 DIAGNOSIS — I10 Essential (primary) hypertension: Secondary | ICD-10-CM | POA: Diagnosis not present

## 2023-04-22 DIAGNOSIS — Z7984 Long term (current) use of oral hypoglycemic drugs: Secondary | ICD-10-CM | POA: Diagnosis not present

## 2023-04-22 DIAGNOSIS — E1165 Type 2 diabetes mellitus with hyperglycemia: Secondary | ICD-10-CM | POA: Diagnosis not present

## 2023-04-22 DIAGNOSIS — E782 Mixed hyperlipidemia: Secondary | ICD-10-CM | POA: Diagnosis not present

## 2023-04-22 MED ORDER — AMLODIPINE BESYLATE 10 MG PO TABS: 10.0000 mg | ORAL_TABLET | Freq: Every day | ORAL | 3 refills | Status: DC

## 2023-04-22 MED ORDER — ATORVASTATIN CALCIUM 10 MG PO TABS: 10.0000 mg | ORAL_TABLET | Freq: Every day | ORAL | 1 refills | Status: DC

## 2023-04-22 MED ORDER — METFORMIN HCL 1000 MG PO TABS
1000.0000 mg | ORAL_TABLET | Freq: Two times a day (BID) | ORAL | 1 refills | Status: DC
Start: 2023-04-22 — End: 2023-10-18

## 2023-04-22 MED ORDER — GLIPIZIDE ER 10 MG PO TB24: 10.0000 mg | ORAL_TABLET | Freq: Every day | ORAL | 1 refills | Status: DC

## 2023-04-22 MED ORDER — TIRZEPATIDE 5 MG/0.5ML ~~LOC~~ SOAJ: 5.0000 mg | SUBCUTANEOUS | 1 refills | Status: DC

## 2023-04-22 NOTE — Progress Notes (Signed)
 Established Patient Office Visit  Subjective:  Patient ID: Bryan Wright, male    DOB: 1955/03/28  Age: 68 y.o. MRN: [id number]  CC:  Chief Complaint  Patient presents with   Care Management    3 month f/u, didn't get a refill for mounjaro.     HPI Bryan Wright is a 68 y.o. male with past medical history of hypertension, type 2 diabetes mellitus, goiter and chronic pain syndrome 2/2 to lumbar spondylosis who presents for f/u of his chronic medical conditions.  HTN: His blood pressure is wnl today, but better than prior.  He takes benazepril 40 mg daily and amlodipine 10 mg QD.  Denies any headache, dizziness, chest pain, dyspnea or palpitations currently.  Type II DM: Used to be followed by endocrinology.  He takes Metformin 1000 mg twice daily, glipizide 10 mg daily. His last HbA1c was 9.0 in 12/24.  He was placed on Mounjaro 2.5 mg QW, and was tolerating it well for 4 weeks, but he could not get refills from pharmacy - he is not sure whether there was insurance coverage issue. Denies any polyuria or polyphagia.  He admits that he had not been watching over his diet in the past.  His cholesterol level was elevated.  He is noncompliant to atorvastatin.  He takes Norco 10-325 mg PRN for severe pain and has fentanyl patch for severe low back pain, managed by [facility] pain clinic  -[facility] in [place].  He reports erectile dysfunction and has used sildenafil. Denies any dysuria, hematuria, urinary hesitancy or urethral discharge currently.  Insomnia: He also reports difficulty maintaining sleep.  He had been feeling anxiety since losing his wife.  Denies any SI or HI currently.  He tried trazodone, which improved his sleep, but takes it PRN now.  Past Medical History:  Diagnosis Date   Diabetes mellitus dx in 2008   Hyperlipidemia    Hypertension 2008    Past Surgical History:  Procedure Laterality Date   APPENDECTOMY     early 20's   CATARACT EXTRACTION  W/PHACO Left 01/15/2022   Procedure: CATARACT EXTRACTION PHACO AND INTRAOCULAR LENS PLACEMENT (IOC);  Surgeon: Pecolia Ades, MD;  Location: [facility];  Service: Ophthalmology;  Laterality: Left;  CDE: 4.00   CERVICAL DISC SURGERY     LUNG SURGERY Bilateral    inserted chest tube   SPINE SURGERY  01/20/2000   cervical  2002   SPINE SURGERY  2009, 2010   lumbar    History reviewed. No pertinent family history.  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  Tobacco Use   Smoking status: Former    Current packs/day: 0.00    Average packs/day: 1.5 packs/day for 5.0 years (7.5 ttl pk-yrs)    Types: Cigarettes    Start date: 48    Quit date: 1983    Years since quitting: 42.2   Smokeless tobacco: Former    Types: Chew    Quit date: 06/18/2015  Vaping Use   Vaping status: Never Used  Substance and Sexual Activity   Alcohol use: No    Alcohol/week: 0.0 standard drinks of alcohol   Drug use: No   Sexual activity: Yes    Birth control/protection: None  Other Topics Concern  Not on file  Social History Narrative   Lives with wife (in hospital currently rehab)      Two dogs: Precious and Dia Sitter    Lots of rabbits running loose      Son and two grandkids live behind you       Enjoys outside, visit friends,      Diet: not good, eats meats, fast food since wife has been sick   Caffeine: diet soda-usually caffeine free, unsweet tea   Water: 2-3 bottles daily       Wears seat belt   Smoke detectors at home   Does not use phone while driving    Social Drivers of Health   Financial Resource Strain: Low Risk  (04/06/2023)   Overall Financial Resource Strain (CARDIA)    Difficulty of Paying Living Expenses: Not very hard  Food Insecurity: No Food Insecurity  (04/06/2023)   Hunger Vital Sign    Worried About Running Out of Food in the Last Year: Never true    Ran Out of Food in the Last Year: Never true  Transportation Needs: No Transportation Needs (04/06/2023)   PRAPARE - Administrator, Civil Service (Medical): No    Lack of Transportation (Non-Medical): No  Physical Activity: Insufficiently Active (04/06/2023)   Exercise Vital Sign    Days of Exercise per Week: 1 day    Minutes of Exercise per Session: 20 min  Stress: No Stress Concern Present (04/06/2023)   [employer] of Occupational Health - Occupational Stress Questionnaire    Feeling of Stress : Not at all  Social Connections: Moderately Integrated (04/06/2023)   Social Connection and Isolation Panel [NHANES]    Frequency of Communication with Friends and Family: More than three times a week    Frequency of Social Gatherings with Friends and Family: More than three times a week    Attends Religious Services: Never    Database administrator or Organizations: Yes    Attends Banker Meetings: Never    Marital Status: Married  Catering manager Violence: Not At Risk (04/06/2023)   Humiliation, Afraid, Rape, and Kick questionnaire    Fear of Current or Ex-Partner: No    Emotionally Abused: No    Physically Abused: No    Sexually Abused: No    Outpatient Medications Prior to Visit  Medication Sig Dispense Refill   benazepril (LOTENSIN) 40 MG tablet Take 1 tablet (40 mg total) by mouth daily. 90 tablet 1   blood glucose meter kit and supplies 1 each by Other route as directed. Dispense based on patient and insurance preference. Use  four times daily as directed. (FOR ICD-10 E10.9, E11.9). 1 each 0   Blood Glucose Monitoring Suppl (ONETOUCH VERIO FLEX SYSTEM) w/Device KIT Use to test blood glucose twice a day-daily before breakfast and at bedtime. 1 kit 1   cyclobenzaprine (FLEXERIL) 10 MG tablet Take 1 tablet (10 mg total) by mouth 2 (two) times daily as needed  for muscle spasms. 20 tablet 0   fentaNYL (DURAGESIC) 25 MCG/HR Place 1 patch onto the skin every 3 (three) days.     glucose blood (ONETOUCH VERIO) test strip USE TO TEST BLOOD SUGAR THREE TIMES DAILY 150 strip 1   HYDROcodone-acetaminophen (NORCO) 10-325 MG tablet Take 1 tablet by mouth every 6 (six) hours as needed for moderate pain or severe pain.     ibuprofen (ADVIL) 600 MG tablet Take 1 tablet (600 mg total) by mouth every 8 (eight)  hours as needed. 30 tablet 0   Multiple Vitamin (THERA) TABS Take 2 tablets by mouth daily.     OneTouch Delica Lancets 33G MISC Use to test blood glucose twice a day-daily before breakfast and at bedtime. 200 each 1   sildenafil (VIAGRA) 25 MG tablet Take 1 tablet (25 mg total) by mouth daily as needed for erectile dysfunction. 30 tablet 1   traZODone (DESYREL) 50 MG tablet Take 0.5-1 tablets (25-50 mg total) by mouth at bedtime as needed for sleep. 30 tablet 1   UNABLE TO FIND One touch meter and lancets Once daily testing dx e11.9 50 each 5   amLODipine (NORVASC) 10 MG tablet Take 1 tablet (10 mg total) by mouth daily. 90 tablet 1   atorvastatin (LIPITOR) 10 MG tablet Take 1 tablet (10 mg total) by mouth at bedtime. 90 tablet 1   glipiZIDE (GLUCOTROL XL) 10 MG 24 hr tablet Take 1 tablet (10 mg total) by mouth daily with breakfast. 90 tablet 1   metFORMIN (GLUCOPHAGE) 1000 MG tablet Take 1 tablet (1,000 mg total) by mouth 2 (two) times daily with a meal. 180 tablet 1   tirzepatide (MOUNJARO) 2.5 MG/0.5ML Pen Inject 2.5 mg into the skin once a week. (Patient not taking: Reported on 04/22/2023) 2 mL 0   No facility-administered medications prior to visit.    No Known Allergies  ROS Review of Systems  Constitutional:  Negative for chills and fever.  HENT:  Negative for congestion and sore throat.   Eyes:  Negative for pain and discharge.  Respiratory:  Negative for cough and shortness of breath.   Cardiovascular:  Negative for chest pain and palpitations.   Gastrointestinal:  Negative for constipation, diarrhea, nausea and vomiting.  Endocrine: Negative for polydipsia and polyuria.  Genitourinary:  Negative for dysuria and hematuria.  Musculoskeletal:  Positive for arthralgias (R shoulder) and back pain. Negative for neck pain and neck stiffness.  Skin:  Negative for rash.  Neurological:  Negative for dizziness, weakness, numbness and headaches.  Psychiatric/Behavioral:  Positive for sleep disturbance. Negative for agitation and behavioral problems. The patient is nervous/anxious.       Objective:    Physical Exam Vitals reviewed.  Constitutional:      General: He is not in acute distress.    Appearance: He is obese. He is not diaphoretic.  HENT:     Head: Normocephalic and atraumatic.     Nose: Nose normal.     Mouth/Throat:     Mouth: Mucous membranes are moist.  Eyes:     General: No scleral icterus.    Extraocular Movements: Extraocular movements intact.  Cardiovascular:     Rate and Rhythm: Normal rate and regular rhythm.     Heart sounds: Normal heart sounds. No murmur heard. Pulmonary:     Breath sounds: Normal breath sounds. No wheezing or rales.  Musculoskeletal:     Cervical back: Neck supple. No tenderness.     Right lower leg: No edema.     Left lower leg: No edema.  Skin:    General: Skin is warm.     Findings: No rash.  Neurological:     General: No focal deficit present.     Mental Status: He is alert and oriented to person, place, and time.     Sensory: No sensory deficit.     Gait: Gait abnormal (Slow).  Psychiatric:        Mood and Affect: Mood normal.  Behavior: Behavior normal.     BP 138/72 (BP Location: Left Arm)   Pulse 78   Ht 5' 10.5" (1.791 m)   Wt 253 lb 6.4 oz (114.9 kg)   SpO2 92%   BMI 35.85 kg/m  Wt Readings from Last 3 Encounters:  04/22/23 253 lb 6.4 oz (114.9 kg)  04/06/23 250 lb (113.4 kg)  03/02/23 250 lb (113.4 kg)    Lab Results  Component Value Date   TSH 1.290  01/19/2023   Lab Results  Component Value Date   WBC 9.8 08/16/2020   HGB 14.5 08/16/2020   HCT 42.6 08/16/2020   MCV 90 08/16/2020   PLT 205 08/16/2020   Lab Results  Component Value Date   NA 137 01/19/2023   K 4.6 01/19/2023   CO2 22 01/19/2023   GLUCOSE 235 (H) 01/19/2023   BUN 16 01/19/2023   CREATININE 0.92 01/19/2023   BILITOT 0.5 01/19/2023   ALKPHOS 75 01/19/2023   AST 24 01/19/2023   ALT 15 01/19/2023   PROT 7.0 01/19/2023   ALBUMIN 4.4 01/19/2023   CALCIUM 9.4 01/19/2023   ANIONGAP 7 07/21/2020   EGFR 92 01/19/2023   Lab Results  Component Value Date   CHOL 174 01/19/2023   Lab Results  Component Value Date   HDL 44 01/19/2023   Lab Results  Component Value Date   LDLCALC 116 (H) 01/19/2023   Lab Results  Component Value Date   TRIG 75 01/19/2023   Lab Results  Component Value Date   CHOLHDL 4.0 01/19/2023   Lab Results  Component Value Date   HGBA1C 9.0 (H) 01/19/2023      Assessment & Plan:   Problem List Items Addressed This Visit       Cardiovascular and Mediastinum   Essential hypertension, benign   BP Readings from Last 1 Encounters:  04/22/23 138/72   Well-controlled with benazepril 40 mg daily and amlodipine 10 mg daily Counseled for compliance with the medications Advised DASH diet and moderate exercise/walking, at least 150 mins/week      Relevant Medications   atorvastatin (LIPITOR) 10 MG tablet   amLODipine (NORVASC) 10 MG tablet     Endocrine   Type 2 diabetes mellitus with hyperglycemia, without long-term current use of insulin (HCC) - Primary   Lab Results  Component Value Date   HGBA1C 9.0 (H) 01/19/2023   Uncontrolled On Metformin 1000 mg twice daily and Glipizide 10 mg once daily - refilled Did not tolerate Jardiance, had urinary frequency Had started Mounjaro 2.5 mg qw - increase dose to 5 mg qw for now Advised to follow diabetic diet On statin and ACEi F/u CMP, HbA1c Diabetic eye exam: Advised to  follow up with Ophthalmology for diabetic eye exam      Relevant Medications   tirzepatide (MOUNJARO) 5 MG/0.5ML Pen   metFORMIN (GLUCOPHAGE) 1000 MG tablet   atorvastatin (LIPITOR) 10 MG tablet   glipiZIDE (GLUCOTROL XL) 10 MG 24 hr tablet   Other Relevant Orders   CMP14+EGFR   Hemoglobin A1c     Other   Morbid obesity (HCC)   BMI Readings from Last 3 Encounters:  04/22/23 35.85 kg/m  04/06/23 35.87 kg/m  03/02/23 35.87 kg/m   Associated with HTN, DM and HLD Advised to follow low-carb diet and perform moderate exercise/walking at least 150 minutes/week Started Mounjaro for type II DM      Relevant Medications   tirzepatide (MOUNJARO) 5 MG/0.5ML Pen   metFORMIN (GLUCOPHAGE) 1000 MG  tablet   glipiZIDE (GLUCOTROL XL) 10 MG 24 hr tablet   Mixed hyperlipidemia   LDL above goal Needs to take Lipitor regularly, refilled      Relevant Medications   atorvastatin (LIPITOR) 10 MG tablet   amLODipine (NORVASC) 10 MG tablet   Lumbar post-laminectomy syndrome   Has chronic low back pain On Norco and fentanyl patch, followed by [facility]      Primary insomnia   Sleep hygiene material provided Has trazodone 50 mg at bedtime PRN          Meds ordered this encounter  Medications   tirzepatide (MOUNJARO) 5 MG/0.5ML Pen    Sig: Inject 5 mg into the skin once a week.    Dispense:  6 mL    Refill:  1   metFORMIN (GLUCOPHAGE) 1000 MG tablet    Sig: Take 1 tablet (1,000 mg total) by mouth 2 (two) times daily with a meal.    Dispense:  180 tablet    Refill:  1   atorvastatin (LIPITOR) 10 MG tablet    Sig: Take 1 tablet (10 mg total) by mouth at bedtime.    Dispense:  90 tablet    Refill:  1   amLODipine (NORVASC) 10 MG tablet    Sig: Take 1 tablet (10 mg total) by mouth daily.    Dispense:  90 tablet    Refill:  3   glipiZIDE (GLUCOTROL XL) 10 MG 24 hr tablet    Sig: Take 1 tablet (10 mg total) by mouth daily with breakfast.    Dispense:  90 tablet     Refill:  1    Follow-up: Return in about 4 months (around 08/22/2023) for HTN and DM.    Anabel Halon, MD

## 2023-04-22 NOTE — Assessment & Plan Note (Signed)
 Sleep hygiene material provided Has trazodone 50 mg at bedtime PRN

## 2023-04-22 NOTE — Assessment & Plan Note (Signed)
 BP Readings from Last 1 Encounters:  04/22/23 138/72   Well-controlled with benazepril 40 mg daily and amlodipine 10 mg daily Counseled for compliance with the medications Advised DASH diet and moderate exercise/walking, at least 150 mins/week

## 2023-04-22 NOTE — Patient Instructions (Signed)
 Please start taking Mounjaro 5 mg once weekly.  Please continue to take medications as prescribed.  Please continue to follow low carb diet and perform moderate exercise/walking at least 150 mins/week.

## 2023-04-22 NOTE — Assessment & Plan Note (Signed)
 BMI Readings from Last 3 Encounters:  04/22/23 35.85 kg/m  04/06/23 35.87 kg/m  03/02/23 35.87 kg/m   Associated with HTN, DM and HLD Advised to follow low-carb diet and perform moderate exercise/walking at least 150 minutes/week Started Mounjaro for type II DM

## 2023-04-22 NOTE — Assessment & Plan Note (Signed)
 LDL above goal Needs to take Lipitor regularly, refilled

## 2023-04-22 NOTE — Assessment & Plan Note (Signed)
 Has chronic low back pain On Norco and fentanyl patch, followed by [facility] pain clinic

## 2023-04-22 NOTE — Assessment & Plan Note (Addendum)
 Lab Results  Component Value Date   HGBA1C 9.0 (H) 01/19/2023   Uncontrolled On Metformin 1000 mg twice daily and Glipizide 10 mg once daily - refilled Did not tolerate Jardiance, had urinary frequency Had started Mounjaro 2.5 mg qw - increase dose to 5 mg qw for now Advised to follow diabetic diet On statin and ACEi F/u CMP, HbA1c Diabetic eye exam: Advised to follow up with Ophthalmology for diabetic eye exam

## 2023-04-23 LAB — CMP14+EGFR
ALT: 18 IU/L (ref 0–44)
AST: 26 IU/L (ref 0–40)
Albumin: 4.5 g/dL (ref 3.9–4.9)
Alkaline Phosphatase: 78 IU/L (ref 44–121)
BUN/Creatinine Ratio: 14 (ref 10–24)
BUN: 13 mg/dL (ref 8–27)
Bilirubin Total: 0.5 mg/dL (ref 0.0–1.2)
CO2: 23 mmol/L (ref 20–29)
Calcium: 9.7 mg/dL (ref 8.6–10.2)
Chloride: 97 mmol/L (ref 96–106)
Creatinine, Ser: 0.93 mg/dL (ref 0.76–1.27)
Globulin, Total: 2.6 g/dL (ref 1.5–4.5)
Glucose: 139 mg/dL — ABNORMAL HIGH (ref 70–99)
Potassium: 4.4 mmol/L (ref 3.5–5.2)
Sodium: 137 mmol/L (ref 134–144)
Total Protein: 7.1 g/dL (ref 6.0–8.5)
eGFR: 91 mL/min/{1.73_m2} (ref >59)

## 2023-04-23 LAB — HEMOGLOBIN A1C
Est. average glucose Bld gHb Est-mCnc: 183 mg/dL
Hgb A1c MFr Bld: 8 % — ABNORMAL HIGH (ref 4.8–5.6)

## 2023-05-25 DIAGNOSIS — Z9689 Presence of other specified functional implants: Secondary | ICD-10-CM | POA: Diagnosis not present

## 2023-05-25 DIAGNOSIS — G894 Chronic pain syndrome: Secondary | ICD-10-CM | POA: Diagnosis not present

## 2023-05-25 DIAGNOSIS — M961 Postlaminectomy syndrome, not elsewhere classified: Secondary | ICD-10-CM | POA: Diagnosis not present

## 2023-05-25 DIAGNOSIS — M5416 Radiculopathy, lumbar region: Secondary | ICD-10-CM | POA: Diagnosis not present

## 2023-05-25 DIAGNOSIS — M5441 Lumbago with sciatica, right side: Secondary | ICD-10-CM | POA: Diagnosis not present

## 2023-08-24 ENCOUNTER — Ambulatory Visit: Payer: Self-pay | Admitting: Internal Medicine

## 2023-08-24 ENCOUNTER — Encounter: Payer: Self-pay | Admitting: Internal Medicine

## 2023-08-24 VITALS — BP 134/69 | HR 62 | Ht 70.5 in | Wt 237.6 lb

## 2023-08-24 DIAGNOSIS — E1165 Type 2 diabetes mellitus with hyperglycemia: Secondary | ICD-10-CM

## 2023-08-24 DIAGNOSIS — E559 Vitamin D deficiency, unspecified: Secondary | ICD-10-CM | POA: Diagnosis not present

## 2023-08-24 DIAGNOSIS — M961 Postlaminectomy syndrome, not elsewhere classified: Secondary | ICD-10-CM | POA: Diagnosis not present

## 2023-08-24 DIAGNOSIS — I10 Essential (primary) hypertension: Secondary | ICD-10-CM

## 2023-08-24 DIAGNOSIS — Z0001 Encounter for general adult medical examination with abnormal findings: Secondary | ICD-10-CM

## 2023-08-24 DIAGNOSIS — Z125 Encounter for screening for malignant neoplasm of prostate: Secondary | ICD-10-CM | POA: Diagnosis not present

## 2023-08-24 DIAGNOSIS — F5101 Primary insomnia: Secondary | ICD-10-CM

## 2023-08-24 DIAGNOSIS — E782 Mixed hyperlipidemia: Secondary | ICD-10-CM

## 2023-08-24 MED ORDER — TIRZEPATIDE 7.5 MG/0.5ML ~~LOC~~ SOAJ: 7.5000 mg | SUBCUTANEOUS | 1 refills | Status: DC

## 2023-08-24 NOTE — Assessment & Plan Note (Signed)
 Physical exam as documented. Fasting blood tests today. Advised to get Shingrix and Tdap vaccines at local pharmacy. Needs to send Cologuard sample.

## 2023-08-24 NOTE — Assessment & Plan Note (Signed)
 BMI Readings from Last 3 Encounters:  08/24/23 33.61 kg/m  04/22/23 35.85 kg/m  04/06/23 35.87 kg/m   Associated with HTN, DM and HLD Advised to follow low-carb diet and perform moderate exercise/walking at least 150 minutes/week On Mounjaro  for type II DM

## 2023-08-24 NOTE — Assessment & Plan Note (Signed)
 Sleep hygiene material provided Has trazodone 50 mg at bedtime PRN

## 2023-08-24 NOTE — Progress Notes (Signed)
 Established Patient Office Visit  Subjective:  Patient ID: Bryan Wright, male    DOB: 03/31/1955  Age: 68 y.o. MRN: [id number]  CC:  Chief Complaint  Patient presents with   Hypertension    Four month follow up   Diabetes    Four month follow up   Annual Exam    HPI Bryan Wright is a 68 y.o. male with past medical history of hypertension, type 2 diabetes mellitus, goiter and chronic pain syndrome 2/2 to lumbar spondylosis who presents for f/u of his chronic medical conditions.  HTN: His blood pressure is wnl today, better than prior.  He takes benazepril  40 mg daily and amlodipine  10 mg QD.  Denies any headache, dizziness, chest pain, dyspnea or palpitations currently.  Type II DM: Used to be followed by endocrinology.  He takes Mounjaro  5 mg qw, Metformin  1000 mg twice daily, glipizide  10 mg daily. His last HbA1c was 8.0 in 04/25. Denies any polyuria or polyphagia. He has lost 16 lbs since the last visit.  His cholesterol level was elevated.  He was noncompliant to atorvastatin , but takes it regularly now.  He takes Norco 10-325 mg PRN for severe pain and has fentanyl patch for severe low back pain, managed by [facility] pain clinic  -[facility] in [place].  He reports erectile dysfunction and has used sildenafil . Denies any dysuria, hematuria, urinary hesitancy or urethral discharge currently.  Insomnia: He also reports difficulty maintaining sleep.  He had been feeling anxiety since losing his wife.  Denies any SI or HI currently.  He tried trazodone , which improved his sleep, but takes it PRN now.  Past Medical History:  Diagnosis Date   Diabetes mellitus dx in 2008   Hyperlipidemia    Hypertension 2008    Past Surgical History:  Procedure Laterality Date   APPENDECTOMY     early 20's   CATARACT EXTRACTION W/PHACO Left 01/15/2022   Procedure: CATARACT EXTRACTION PHACO AND INTRAOCULAR LENS PLACEMENT (IOC);  Surgeon: Juli Blunt, MD;  Location:  [facility];  Service: Ophthalmology;  Laterality: Left;  CDE: 4.00   CERVICAL DISC SURGERY     LUNG SURGERY Bilateral    inserted chest tube   SPINE SURGERY  01/20/2000   cervical  2002   SPINE SURGERY  2009, 2010   lumbar    History reviewed. No pertinent family history.  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  Tobacco Use   Smoking status: Former    Current packs/day: 0.00    Average packs/day: 1.5 packs/day for 5.0 years (7.5 ttl pk-yrs)    Types: Cigarettes    Start date: 18    Quit date: 1983    Years since quitting: 42.6   Smokeless tobacco: Former    Types: Chew    Quit date: 06/18/2015  Vaping Use   Vaping status: Never Used  Substance and Sexual Activity   Alcohol use: No    Alcohol/week: 0.0 standard drinks of alcohol   Drug use: No   Sexual activity: Yes    Birth control/protection: None  Other Topics Concern   Not on file  Social History Narrative   Lives with wife (in hospital currently rehab)      Two dogs: Precious and  Sebastian    Lots of rabbits running loose      Son and two grandkids live behind you       Enjoys outside, visit friends,      Diet: not good, eats meats, fast food since wife has been sick   Caffeine: diet soda-usually caffeine free, unsweet tea   Water : 2-3 bottles daily       Wears seat belt   Smoke detectors at home   Does not use phone while driving    Social Drivers of Health   Financial Resource Strain: Low Risk  (04/06/2023)   Overall Financial Resource Strain (CARDIA)    Difficulty of Paying Living Expenses: Not very hard  Food Insecurity: No Food Insecurity (04/06/2023)   Hunger Vital Sign    Worried About Running Out of Food in the Last Year: Never true    Ran Out of Food in the Last Year: Never true   Transportation Needs: No Transportation Needs (04/06/2023)   PRAPARE - Administrator, Civil Service (Medical): No    Lack of Transportation (Non-Medical): No  Physical Activity: Insufficiently Active (04/06/2023)   Exercise Vital Sign    Days of Exercise per Week: 1 day    Minutes of Exercise per Session: 20 min  Stress: No Stress Concern Present (04/06/2023)   [employer] of Occupational Health - Occupational Stress Questionnaire    Feeling of Stress : Not at all  Social Connections: Moderately Integrated (04/06/2023)   Social Connection and Isolation Panel    Frequency of Communication with Friends and Family: More than three times a week    Frequency of Social Gatherings with Friends and Family: More than three times a week    Attends Religious Services: Never    Database administrator or Organizations: Yes    Attends Banker Meetings: Never    Marital Status: Married  Catering manager Violence: Not At Risk (04/06/2023)   Humiliation, Afraid, Rape, and Kick questionnaire    Fear of Current or Ex-Partner: No    Emotionally Abused: No    Physically Abused: No    Sexually Abused: No    Outpatient Medications Prior to Visit  Medication Sig Dispense Refill   amLODipine  (NORVASC ) 10 MG tablet Take 1 tablet (10 mg total) by mouth daily. 90 tablet 3   atorvastatin  (LIPITOR) 10 MG tablet Take 1 tablet (10 mg total) by mouth at bedtime. 90 tablet 1   benazepril  (LOTENSIN ) 40 MG tablet Take 1 tablet (40 mg total) by mouth daily. 90 tablet 1   blood glucose meter kit and supplies 1 each by Other route as directed. Dispense based on patient and insurance preference. Use  four times daily as directed. (FOR ICD-10 E10.9, E11.9). 1 each 0   Blood Glucose Monitoring Suppl (ONETOUCH VERIO FLEX SYSTEM) w/Device KIT Use to test blood glucose twice a day-daily before breakfast and at bedtime. 1 kit 1   cyclobenzaprine  (FLEXERIL ) 10 MG tablet Take 1 tablet (10 mg total) by  mouth 2 (two) times daily as needed for muscle spasms. 20 tablet 0   fentaNYL (DURAGESIC) 25 MCG/HR Place 1 patch onto the skin every 3 (three) days.     glipiZIDE  (GLUCOTROL  XL) 10 MG 24 hr tablet Take 1 tablet (10 mg total) by mouth daily with breakfast. 90 tablet 1   glucose blood (ONETOUCH VERIO) test strip USE TO TEST BLOOD SUGAR THREE TIMES DAILY 150 strip 1   HYDROcodone-acetaminophen (NORCO) 10-325 MG tablet  Take 1 tablet by mouth every 6 (six) hours as needed for moderate pain or severe pain.     ibuprofen  (ADVIL ) 600 MG tablet Take 1 tablet (600 mg total) by mouth every 8 (eight) hours as needed. 30 tablet 0   metFORMIN  (GLUCOPHAGE ) 1000 MG tablet Take 1 tablet (1,000 mg total) by mouth 2 (two) times daily with a meal. 180 tablet 1   Multiple Vitamin (THERA) TABS Take 2 tablets by mouth daily.     OneTouch Delica Lancets 33G MISC Use to test blood glucose twice a day-daily before breakfast and at bedtime. 200 each 1   sildenafil  (VIAGRA ) 25 MG tablet Take 1 tablet (25 mg total) by mouth daily as needed for erectile dysfunction. 30 tablet 1   tirzepatide  (MOUNJARO ) 5 MG/0.5ML Pen Inject 5 mg into the skin once a week. 6 mL 1   traZODone  (DESYREL ) 50 MG tablet Take 0.5-1 tablets (25-50 mg total) by mouth at bedtime as needed for sleep. 30 tablet 1   UNABLE TO FIND One touch meter and lancets Once daily testing dx e11.9 50 each 5   No facility-administered medications prior to visit.    No Known Allergies  ROS Review of Systems  Constitutional:  Negative for chills and fever.  HENT:  Negative for congestion and sore throat.   Eyes:  Negative for pain and discharge.  Respiratory:  Negative for cough and shortness of breath.   Cardiovascular:  Negative for chest pain and palpitations.  Gastrointestinal:  Negative for constipation, diarrhea, nausea and vomiting.  Endocrine: Negative for polydipsia and polyuria.  Genitourinary:  Negative for dysuria and hematuria.  Musculoskeletal:   Positive for arthralgias (R shoulder) and back pain. Negative for neck pain and neck stiffness.  Skin:  Negative for rash.  Neurological:  Negative for dizziness, weakness, numbness and headaches.  Psychiatric/Behavioral:  Positive for sleep disturbance. Negative for agitation and behavioral problems. The patient is nervous/anxious.       Objective:    Physical Exam Vitals reviewed.  Constitutional:      General: He is not in acute distress.    Appearance: He is obese. He is not diaphoretic.  HENT:     Head: Normocephalic and atraumatic.     Nose: Nose normal.     Mouth/Throat:     Mouth: Mucous membranes are moist.  Eyes:     General: No scleral icterus.    Extraocular Movements: Extraocular movements intact.  Cardiovascular:     Rate and Rhythm: Normal rate and regular rhythm.     Heart sounds: Normal heart sounds. No murmur heard. Pulmonary:     Breath sounds: Normal breath sounds. No wheezing or rales.  Abdominal:     Palpations: Abdomen is soft.     Tenderness: There is no abdominal tenderness.  Musculoskeletal:     Cervical back: Neck supple. No tenderness.     Right lower leg: No edema.     Left lower leg: No edema.  Skin:    General: Skin is warm.     Findings: No rash.  Neurological:     General: No focal deficit present.     Mental Status: He is alert and oriented to person, place, and time.     Sensory: No sensory deficit.     Gait: Gait abnormal (Slow).  Psychiatric:        Mood and Affect: Mood normal.        Behavior: Behavior normal.     BP 134/69  Pulse 62   Ht 5' 10.5 (1.791 m)   Wt 237 lb 9.6 oz (107.8 kg)   SpO2 97%   BMI 33.61 kg/m  Wt Readings from Last 3 Encounters:  08/24/23 237 lb 9.6 oz (107.8 kg)  04/22/23 253 lb 6.4 oz (114.9 kg)  04/06/23 250 lb (113.4 kg)    Lab Results  Component Value Date   TSH 1.290 01/19/2023   Lab Results  Component Value Date   WBC 9.8 08/16/2020   HGB 14.5 08/16/2020   HCT 42.6 08/16/2020    MCV 90 08/16/2020   PLT 205 08/16/2020   Lab Results  Component Value Date   NA 137 04/22/2023   K 4.4 04/22/2023   CO2 23 04/22/2023   GLUCOSE 139 (H) 04/22/2023   BUN 13 04/22/2023   CREATININE 0.93 04/22/2023   BILITOT 0.5 04/22/2023   ALKPHOS 78 04/22/2023   AST 26 04/22/2023   ALT 18 04/22/2023   PROT 7.1 04/22/2023   ALBUMIN 4.5 04/22/2023   CALCIUM  9.7 04/22/2023   ANIONGAP 7 07/21/2020   EGFR 91 04/22/2023   Lab Results  Component Value Date   CHOL 174 01/19/2023   Lab Results  Component Value Date   HDL 44 01/19/2023   Lab Results  Component Value Date   LDLCALC 116 (H) 01/19/2023   Lab Results  Component Value Date   TRIG 75 01/19/2023   Lab Results  Component Value Date   CHOLHDL 4.0 01/19/2023   Lab Results  Component Value Date   HGBA1C 8.0 (H) 04/22/2023      Assessment & Plan:   Problem List Items Addressed This Visit       Cardiovascular and Mediastinum   Essential hypertension, benign   BP Readings from Last 1 Encounters:  08/24/23 134/69   Well-controlled with benazepril  40 mg daily and amlodipine  10 mg daily Counseled for compliance with the medications Advised DASH diet and moderate exercise/walking, at least 150 mins/week      Relevant Orders   TSH   CMP14+EGFR   CBC with Differential/Platelet     Endocrine   Type 2 diabetes mellitus with hyperglycemia, without long-term current use of insulin (HCC)   Lab Results  Component Value Date   HGBA1C 8.0 (H) 04/22/2023   Uncontrolled, but improved compared to prior On Metformin  1000 mg twice daily and Glipizide  10 mg once daily - refilled Did not tolerate Jardiance , had urinary frequency On Mounjaro  5 mg qw - increased dose to 7.5 mg qw, may need to reduce Glipizide  dose based on HbA1c Advised to follow diabetic diet On statin and ACEi F/u CMP, HbA1c Diabetic eye exam: Advised to follow up with Ophthalmology for diabetic eye exam      Relevant Medications   tirzepatide   (MOUNJARO ) 7.5 MG/0.5ML Pen (Start on 09/24/2023)   Other Relevant Orders   Hemoglobin A1c   CMP14+EGFR     Other   Morbid obesity (HCC)   BMI Readings from Last 3 Encounters:  08/24/23 33.61 kg/m  04/22/23 35.85 kg/m  04/06/23 35.87 kg/m   Associated with HTN, DM and HLD Advised to follow low-carb diet and perform moderate exercise/walking at least 150 minutes/week On Mounjaro  for type II DM      Relevant Medications   tirzepatide  (MOUNJARO ) 7.5 MG/0.5ML Pen (Start on 09/24/2023)   Mixed hyperlipidemia   Relevant Orders   Lipid panel   Encounter for general adult medical examination with abnormal findings - Primary   Physical exam as documented.  Fasting blood tests today. Advised to get Shingrix and Tdap vaccines at local pharmacy. Needs to send Cologuard sample.      Lumbar post-laminectomy syndrome   Has chronic low back pain On Norco and fentanyl patch, followed by [facility]      Primary insomnia   Sleep hygiene material provided Has trazodone  50 mg at bedtime PRN      Prostate cancer screening   Ordered PSA after discussing its limitations for prostate cancer screening, including false positive results leading to additional investigations.      Relevant Orders   PSA   Other Visit Diagnoses       Vitamin D  deficiency       Relevant Orders   VITAMIN D  25 Hydroxy (Vit-D Deficiency, Fractures)           Meds ordered this encounter  Medications   tirzepatide  (MOUNJARO ) 7.5 MG/0.5ML Pen    Sig: Inject 7.5 mg into the skin once a week.    Dispense:  6 mL    Refill:  1    Dose change - 09/24/23    Follow-up: Return in about 4 months (around 12/24/2023) for DM and HTN.    Suzzane MARLA Blanch, MD

## 2023-08-24 NOTE — Assessment & Plan Note (Signed)
 Has chronic low back pain On Norco and fentanyl patch, followed by [facility] pain clinic

## 2023-08-24 NOTE — Patient Instructions (Addendum)
 Please start taking Mounjaro  7.5 mg once you complete 5 mg dose.  Please continue to take medications as prescribed.  Please continue to follow low carb diet and perform moderate exercise/walking at least 150 mins/week.  Please consider getting Shingrix and Tdap vaccine at local pharmacy.  Please submit cologuard sample.

## 2023-08-24 NOTE — Assessment & Plan Note (Signed)
 BP Readings from Last 1 Encounters:  08/24/23 134/69   Well-controlled with benazepril  40 mg daily and amlodipine  10 mg daily Counseled for compliance with the medications Advised DASH diet and moderate exercise/walking, at least 150 mins/week

## 2023-08-24 NOTE — Assessment & Plan Note (Signed)
 Ordered PSA after discussing its limitations for prostate cancer screening, including false positive results leading to additional investigations.

## 2023-08-24 NOTE — Assessment & Plan Note (Addendum)
 Lab Results  Component Value Date   HGBA1C 8.0 (H) 04/22/2023   Uncontrolled, but improved compared to prior On Metformin  1000 mg twice daily and Glipizide  10 mg once daily - refilled Did not tolerate Jardiance , had urinary frequency On Mounjaro  5 mg qw - increased dose to 7.5 mg qw, may need to reduce Glipizide  dose based on HbA1c Advised to follow diabetic diet On statin and ACEi F/u CMP, HbA1c Diabetic eye exam: Advised to follow up with Ophthalmology for diabetic eye exam

## 2023-08-25 ENCOUNTER — Ambulatory Visit: Payer: Self-pay | Admitting: Internal Medicine

## 2023-08-25 ENCOUNTER — Other Ambulatory Visit: Payer: Self-pay | Admitting: Internal Medicine

## 2023-08-25 DIAGNOSIS — R972 Elevated prostate specific antigen [PSA]: Secondary | ICD-10-CM | POA: Insufficient documentation

## 2023-08-25 LAB — CMP14+EGFR
ALT: 19 IU/L (ref 0–44)
AST: 28 IU/L (ref 0–40)
Albumin: 4.5 g/dL (ref 3.9–4.9)
Alkaline Phosphatase: 80 IU/L (ref 44–121)
BUN/Creatinine Ratio: 14 (ref 10–24)
BUN: 15 mg/dL (ref 8–27)
Bilirubin Total: 0.5 mg/dL (ref 0.0–1.2)
CO2: 24 mmol/L (ref 20–29)
Calcium: 9.6 mg/dL (ref 8.6–10.2)
Chloride: 100 mmol/L (ref 96–106)
Creatinine, Ser: 1.08 mg/dL (ref 0.76–1.27)
Globulin, Total: 2.9 g/dL (ref 1.5–4.5)
Glucose: 114 mg/dL — ABNORMAL HIGH (ref 70–99)
Potassium: 4.3 mmol/L (ref 3.5–5.2)
Sodium: 140 mmol/L (ref 134–144)
Total Protein: 7.4 g/dL (ref 6.0–8.5)
eGFR: 76 mL/min/1.73 (ref >59)

## 2023-08-25 LAB — CBC WITH DIFFERENTIAL/PLATELET
Basophils Absolute: 0 x10E3/uL (ref 0.0–0.2)
Basos: 0 %
EOS (ABSOLUTE): 0.2 x10E3/uL (ref 0.0–0.4)
Eos: 2 %
Hematocrit: 45.4 % (ref 37.5–51.0)
Hemoglobin: 15.1 g/dL (ref 13.0–17.7)
Immature Grans (Abs): 0 x10E3/uL (ref 0.0–0.1)
Immature Granulocytes: 0 %
Lymphocytes Absolute: 1.9 x10E3/uL (ref 0.7–3.1)
Lymphs: 19 %
MCH: 30.4 pg (ref 26.6–33.0)
MCHC: 33.3 g/dL (ref 31.5–35.7)
MCV: 91 fL (ref 79–97)
Monocytes Absolute: 0.6 x10E3/uL (ref 0.1–0.9)
Monocytes: 6 %
Neutrophils Absolute: 7 x10E3/uL (ref 1.4–7.0)
Neutrophils: 73 %
Platelets: 248 x10E3/uL (ref 150–450)
RBC: 4.97 x10E6/uL (ref 4.14–5.80)
RDW: 13 % (ref 11.6–15.4)
WBC: 9.8 x10E3/uL (ref 3.4–10.8)

## 2023-08-25 LAB — LIPID PANEL
Chol/HDL Ratio: 2.9 ratio (ref 0.0–5.0)
Cholesterol, Total: 125 mg/dL (ref 100–199)
HDL: 43 mg/dL (ref >39)
LDL Chol Calc (NIH): 66 mg/dL (ref 0–99)
Triglycerides: 82 mg/dL (ref 0–149)
VLDL Cholesterol Cal: 16 mg/dL (ref 5–40)

## 2023-08-25 LAB — VITAMIN D 25 HYDROXY (VIT D DEFICIENCY, FRACTURES): Vit D, 25-Hydroxy: 27.9 ng/mL — ABNORMAL LOW (ref 30.0–100.0)

## 2023-08-25 LAB — PSA: Prostate Specific Ag, Serum: 10.1 ng/mL — ABNORMAL HIGH (ref 0.0–4.0)

## 2023-08-25 LAB — TSH: TSH: 0.943 u[IU]/mL (ref 0.450–4.500)

## 2023-08-25 LAB — HEMOGLOBIN A1C
Est. average glucose Bld gHb Est-mCnc: 171 mg/dL
Hgb A1c MFr Bld: 7.6 % — ABNORMAL HIGH (ref 4.8–5.6)

## 2023-08-25 NOTE — Progress Notes (Signed)
 Pt information

## 2023-08-26 DIAGNOSIS — G894 Chronic pain syndrome: Secondary | ICD-10-CM | POA: Diagnosis not present

## 2023-08-26 DIAGNOSIS — Z79899 Other long term (current) drug therapy: Secondary | ICD-10-CM | POA: Diagnosis not present

## 2023-08-26 DIAGNOSIS — Z5181 Encounter for therapeutic drug level monitoring: Secondary | ICD-10-CM | POA: Diagnosis not present

## 2023-08-26 DIAGNOSIS — M961 Postlaminectomy syndrome, not elsewhere classified: Secondary | ICD-10-CM | POA: Diagnosis not present

## 2023-08-26 DIAGNOSIS — Z9689 Presence of other specified functional implants: Secondary | ICD-10-CM | POA: Diagnosis not present

## 2023-08-26 DIAGNOSIS — M5416 Radiculopathy, lumbar region: Secondary | ICD-10-CM | POA: Diagnosis not present

## 2023-09-10 ENCOUNTER — Other Ambulatory Visit: Payer: Self-pay | Admitting: Internal Medicine

## 2023-09-10 DIAGNOSIS — I10 Essential (primary) hypertension: Secondary | ICD-10-CM

## 2023-10-16 ENCOUNTER — Other Ambulatory Visit: Payer: Self-pay | Admitting: Internal Medicine

## 2023-10-16 DIAGNOSIS — E1165 Type 2 diabetes mellitus with hyperglycemia: Secondary | ICD-10-CM

## 2023-10-18 ENCOUNTER — Other Ambulatory Visit: Payer: Self-pay | Admitting: Internal Medicine

## 2023-10-18 DIAGNOSIS — E782 Mixed hyperlipidemia: Secondary | ICD-10-CM

## 2023-10-18 DIAGNOSIS — E1165 Type 2 diabetes mellitus with hyperglycemia: Secondary | ICD-10-CM

## 2023-11-02 ENCOUNTER — Other Ambulatory Visit: Payer: Self-pay | Admitting: Internal Medicine

## 2023-11-02 MED ORDER — ONETOUCH VERIO FLEX SYSTEM W/DEVICE KIT: PACK | 1 refills | Status: DC

## 2023-11-02 MED ORDER — ONETOUCH VERIO VI STRP: ORAL_STRIP | 1 refills | Status: DC

## 2023-11-02 NOTE — Telephone Encounter (Signed)
 Copied from CRM [id number]. Topic: Clinical - Medication Refill >> Nov 02, 2023  2:53 PM Avram G wrote: Medication: glucose monitor is broken and patient is out of his test strips.  Has the patient contacted their pharmacy? Yes (Agent: If no, request that the patient contact the pharmacy for the refill. If patient does not wish to contact the pharmacy document the reason why and proceed with request.) (Agent: If yes, when and what did the pharmacy advise?)  This is the patient's preferred pharmacy:  [id number] - [place], [place] - [address] AT [address] OF [address] [place] [address] Phone: [phone] Fax: [phone]  [facility] [phone] - [place], [place] - [address] AT [facility] [address] [place] [address] Phone: [phone] Fax: [phone]  Is this the correct pharmacy for this prescription? Yes If no, delete pharmacy and type the correct one.   Has the prescription been filled recently? No  Is the patient out of the medication? Yes  Has the patient been seen for an appointment in the last year OR does the patient have an upcoming appointment? Yes  Can we respond through MyChart? No  Agent: Please be advised that Rx refills may take up to 3 business days. We ask that you follow-up with your pharmacy.

## 2023-11-11 ENCOUNTER — Telehealth: Payer: Self-pay

## 2023-11-11 ENCOUNTER — Other Ambulatory Visit: Payer: Self-pay

## 2023-11-11 MED ORDER — ONETOUCH VERIO FLEX SYSTEM W/DEVICE KIT: PACK | 1 refills | Status: DC

## 2023-11-11 NOTE — Telephone Encounter (Signed)
 Copied from CRM [id number]. Topic: Clinical - Medication Refill >> Nov 02, 2023  2:53 PM Avram G wrote: Medication: glucose monitor is broken and patient is out of his test strips.  Has the patient contacted their pharmacy? Yes (Agent: If no, request that the patient contact the pharmacy for the refill. If patient does not wish to contact the pharmacy document the reason why and proceed with request.) (Agent: If yes, when and what did the pharmacy advise?)  This is the patient's preferred pharmacy:  [id number] - [place], [place] - [address] AT [address] OF [address] [place] [address] Phone: [phone] Fax: [phone]  [facility] [phone] - [place], [place] - [address] AT [facility] [address] [place] [address] Phone: [phone] Fax: [phone]  Is this the correct pharmacy for this prescription? Yes If no, delete pharmacy and type the correct one.   Has the prescription been filled recently? No  Is the patient out of the medication? Yes  Has the patient been seen for an appointment in the last year OR does the patient have an upcoming appointment? Yes  Can we respond through MyChart? No  Agent: Please be advised that Rx refills may take up to 3 business days. We ask that you follow-up with your pharmacy.

## 2023-11-11 NOTE — Telephone Encounter (Signed)
 Pt asked for glucometer to be sent to [facility] in [place]. Rx sent

## 2023-11-18 DIAGNOSIS — M961 Postlaminectomy syndrome, not elsewhere classified: Secondary | ICD-10-CM | POA: Diagnosis not present

## 2023-11-18 DIAGNOSIS — M5441 Lumbago with sciatica, right side: Secondary | ICD-10-CM | POA: Diagnosis not present

## 2023-11-18 DIAGNOSIS — M5416 Radiculopathy, lumbar region: Secondary | ICD-10-CM | POA: Diagnosis not present

## 2023-11-18 DIAGNOSIS — Z9689 Presence of other specified functional implants: Secondary | ICD-10-CM | POA: Diagnosis not present

## 2023-11-18 DIAGNOSIS — G894 Chronic pain syndrome: Secondary | ICD-10-CM | POA: Diagnosis not present

## 2023-12-28 ENCOUNTER — Ambulatory Visit: Admitting: Internal Medicine

## 2023-12-28 ENCOUNTER — Encounter: Payer: Self-pay | Admitting: Internal Medicine

## 2023-12-28 VITALS — BP 122/70 | HR 83 | Ht 70.0 in | Wt 227.4 lb

## 2023-12-28 DIAGNOSIS — I10 Essential (primary) hypertension: Secondary | ICD-10-CM

## 2023-12-28 DIAGNOSIS — E1165 Type 2 diabetes mellitus with hyperglycemia: Secondary | ICD-10-CM

## 2023-12-28 DIAGNOSIS — M961 Postlaminectomy syndrome, not elsewhere classified: Secondary | ICD-10-CM | POA: Diagnosis not present

## 2023-12-28 DIAGNOSIS — Z7984 Long term (current) use of oral hypoglycemic drugs: Secondary | ICD-10-CM | POA: Diagnosis not present

## 2023-12-28 DIAGNOSIS — Z1211 Encounter for screening for malignant neoplasm of colon: Secondary | ICD-10-CM | POA: Insufficient documentation

## 2023-12-28 DIAGNOSIS — Z7985 Long-term (current) use of injectable non-insulin antidiabetic drugs: Secondary | ICD-10-CM

## 2023-12-28 DIAGNOSIS — Z23 Encounter for immunization: Secondary | ICD-10-CM

## 2023-12-28 DIAGNOSIS — R972 Elevated prostate specific antigen [PSA]: Secondary | ICD-10-CM

## 2023-12-28 DIAGNOSIS — E782 Mixed hyperlipidemia: Secondary | ICD-10-CM

## 2023-12-28 DIAGNOSIS — N528 Other male erectile dysfunction: Secondary | ICD-10-CM

## 2023-12-28 MED ORDER — BENAZEPRIL HCL 40 MG PO TABS: 40.0000 mg | ORAL_TABLET | Freq: Every day | ORAL | 3 refills | Status: AC

## 2023-12-28 MED ORDER — SILDENAFIL CITRATE 50 MG PO TABS: 50.0000 mg | ORAL_TABLET | Freq: Every day | ORAL | 1 refills | Status: AC | PRN

## 2023-12-28 MED ORDER — AMLODIPINE BESYLATE 10 MG PO TABS: 10.0000 mg | ORAL_TABLET | Freq: Every day | ORAL | 3 refills | Status: AC

## 2023-12-28 MED ORDER — GLIPIZIDE ER 10 MG PO TB24: 10.0000 mg | ORAL_TABLET | Freq: Every day | ORAL | 3 refills | Status: AC

## 2023-12-28 MED ORDER — ATORVASTATIN CALCIUM 10 MG PO TABS: 10.0000 mg | ORAL_TABLET | Freq: Every day | ORAL | 3 refills | Status: AC

## 2023-12-28 MED ORDER — METFORMIN HCL 1000 MG PO TABS: 1000.0000 mg | ORAL_TABLET | Freq: Two times a day (BID) | ORAL | 3 refills | Status: AC

## 2023-12-28 NOTE — Assessment & Plan Note (Signed)
 Last lipid profile reviewed - LDL 66 Needs to take Lipitor regularly, refilled

## 2023-12-28 NOTE — Assessment & Plan Note (Signed)
 Increase dose of sildenafil  to 50 mg PRN as she did not get adequate response with 25 mg dose

## 2023-12-28 NOTE — Assessment & Plan Note (Addendum)
 Lab Results  Component Value Date   HGBA1C 7.6 (H) 08/24/2023   Uncontrolled, but improved compared to prior On Metformin  1000 mg twice daily and Glipizide  10 mg once daily - refilled Did not tolerate Jardiance , had urinary frequency On Mounjaro  - increased dose to 7.5 mg qw in the last visit Advised to follow diabetic diet On statin and ACEi F/u CMP, HbA1c Diabetic eye exam: Advised to follow up with Ophthalmology for diabetic eye exam

## 2023-12-28 NOTE — Assessment & Plan Note (Signed)
 PSA: 10.1 in 08/25 Last PSA check in 2017 was 3.2 Currently asymptomatic Due to significantly elevated PSA, needs urgent urology evaluation - had referred in the last visit, but has not been able to schedule appointment due to phone tag between the office and him Recheck PSA

## 2023-12-28 NOTE — Patient Instructions (Addendum)
 Please continue to take medications as prescribed.  Please continue to follow low carb diet and perform moderate exercise/walking at least 150 mins/week.  Please get fasting blood tests done after 2 days.

## 2023-12-28 NOTE — Progress Notes (Signed)
 Established Patient Office Visit  Subjective:  Patient ID: Bryan Wright, male    DOB: Sep 12, 1955  Age: 68 y.o. MRN: [id number]  CC:  Chief Complaint  Patient presents with   Diabetes    Follow up   Hypertension    Follow up    HPI Bryan Wright is a 68 y.o. male with past medical history of hypertension, type 2 diabetes mellitus, goiter and chronic pain syndrome 2/2 to lumbar spondylosis who presents for f/u of his chronic medical conditions.  HTN: His blood pressure is wnl today, better than prior.  He takes benazepril  40 mg daily and amlodipine  10 mg QD.  Denies any headache, dizziness, chest pain, dyspnea or palpitations currently.  Type II DM: He takes Mounjaro  7.5 mg qw, Metformin  1000 mg twice daily, glipizide  10 mg daily. His last HbA1c was 7.6 in 08/25. Denies any polyuria or polyphagia. He has lost 10 lbs since the last visit.  His cholesterol level has improved now.  He takes atorvastatin , but takes it regularly now.  Elevated PSA: His PSA was 10.1 in 08/25, was referred to Urology, but has not been able to schedule appt yet. He denies dysuria, hematuria or urinary hesitancy or resistance currently.  He takes Norco 10-325 mg PRN for severe pain and has fentanyl patch for severe low back pain, managed by [facility] pain clinic  -[facility] in [place].  He reports erectile dysfunction, has used sildenafil  25mg  PRN. Denies any dysuria, hematuria, urinary hesitancy or urethral discharge currently.  Insomnia: He also reports difficulty maintaining sleep.  He had been feeling anxiety since losing his wife.  Denies any SI or HI currently.  He tried trazodone , which improved his sleep, but takes it PRN now.  Past Medical History:  Diagnosis Date   Diabetes mellitus dx in 2008   Hyperlipidemia    Hypertension 2008    Past Surgical History:  Procedure Laterality Date   APPENDECTOMY     early 20's   CATARACT EXTRACTION W/PHACO Left 01/15/2022   Procedure:  CATARACT EXTRACTION PHACO AND INTRAOCULAR LENS PLACEMENT (IOC);  Surgeon: Juli Blunt, MD;  Location: [facility];  Service: Ophthalmology;  Laterality: Left;  CDE: 4.00   CERVICAL DISC SURGERY     LUNG SURGERY Bilateral    inserted chest tube   SPINE SURGERY  01/20/2000   cervical  2002   SPINE SURGERY  2009, 2010   lumbar    History reviewed. No pertinent family history.  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  Tobacco Use   Smoking status: Former    Current packs/day: 0.00    Average packs/day: 1.5 packs/day for 5.0 years (7.5 ttl pk-yrs)    Types: Cigarettes    Start date: 15    Quit date: 1983    Years since quitting: 42.9   Smokeless tobacco: Former    Types: Chew    Quit date: 06/18/2015  Vaping Use   Vaping status: Never Used  Substance and Sexual Activity   Alcohol use: No    Alcohol/week: 0.0 standard drinks of alcohol   Drug use: No   Sexual activity: Yes    Birth control/protection: None  Other Topics Concern   Not on file  Social History Narrative  Lives with wife (in hospital currently rehab)      Two dogs: Precious and Sebastian    Lots of rabbits running loose      Son and two grandkids live behind you       Enjoys outside, visit friends,      Diet: not good, eats meats, fast food since wife has been sick   Caffeine: diet soda-usually caffeine free, unsweet tea   Water : 2-3 bottles daily       Wears seat belt   Smoke detectors at home   Does not use phone while driving    Social Drivers of Health   Financial Resource Strain: Low Risk  (04/06/2023)   Overall Financial Resource Strain (CARDIA)    Difficulty of Paying Living Expenses: Not very hard  Food Insecurity: No Food Insecurity (04/06/2023)   Hunger Vital Sign    Worried  About Running Out of Food in the Last Year: Never true    Ran Out of Food in the Last Year: Never true  Transportation Needs: No Transportation Needs (04/06/2023)   PRAPARE - Administrator, Civil Service (Medical): No    Lack of Transportation (Non-Medical): No  Physical Activity: Insufficiently Active (04/06/2023)   Exercise Vital Sign    Days of Exercise per Week: 1 day    Minutes of Exercise per Session: 20 min  Stress: No Stress Concern Present (04/06/2023)   [employer] of Occupational Health - Occupational Stress Questionnaire    Feeling of Stress : Not at all  Social Connections: Moderately Integrated (04/06/2023)   Social Connection and Isolation Panel    Frequency of Communication with Friends and Family: More than three times a week    Frequency of Social Gatherings with Friends and Family: More than three times a week    Attends Religious Services: Never    Database Administrator or Organizations: Yes    Attends Banker Meetings: Never    Marital Status: Married  Catering Manager Violence: Not At Risk (04/06/2023)   Humiliation, Afraid, Rape, and Kick questionnaire    Fear of Current or Ex-Partner: No    Emotionally Abused: No    Physically Abused: No    Sexually Abused: No    Outpatient Medications Prior to Visit  Medication Sig Dispense Refill   blood glucose meter kit and supplies 1 each by Other route as directed. Dispense based on patient and insurance preference. Use  four times daily as directed. (FOR ICD-10 E10.9, E11.9). 1 each 0   Blood Glucose Monitoring Suppl (ONETOUCH VERIO FLEX SYSTEM) w/Device KIT Use to test blood glucose twice a day-daily before breakfast and at bedtime. 1 kit 1   cyclobenzaprine  (FLEXERIL ) 10 MG tablet Take 1 tablet (10 mg total) by mouth 2 (two) times daily as needed for muscle spasms. 20 tablet 0   fentaNYL (DURAGESIC) 25 MCG/HR Place 1 patch onto the skin every 3 (three) days.     glucose blood (ONETOUCH  VERIO) test strip USE TO TEST BLOOD SUGAR THREE TIMES DAILY 150 strip 1   HYDROcodone-acetaminophen (NORCO) 10-325 MG tablet Take 1 tablet by mouth every 6 (six) hours as needed for moderate pain or severe pain.     ibuprofen  (ADVIL ) 600 MG tablet Take 1 tablet (600 mg total) by mouth every 8 (eight) hours as needed. 30 tablet 0   Multiple Vitamin (THERA) TABS Take 2 tablets by mouth daily.     OneTouch Delica Lancets 33G MISC Use to  test blood glucose twice a day-daily before breakfast and at bedtime. 200 each 1   tirzepatide  (MOUNJARO ) 7.5 MG/0.5ML Pen Inject 7.5 mg into the skin once a week. 6 mL 1   traZODone  (DESYREL ) 50 MG tablet Take 0.5-1 tablets (25-50 mg total) by mouth at bedtime as needed for sleep. 30 tablet 1   UNABLE TO FIND One touch meter and lancets Once daily testing dx e11.9 50 each 5   amLODipine  (NORVASC ) 10 MG tablet Take 1 tablet (10 mg total) by mouth daily. 90 tablet 3   atorvastatin  (LIPITOR) 10 MG tablet TAKE 1 TABLET(10 MG) BY MOUTH AT BEDTIME 90 tablet 1   benazepril  (LOTENSIN ) 40 MG tablet TAKE 1 TABLET(40 MG) BY MOUTH DAILY 90 tablet 1   glipiZIDE  (GLUCOTROL  XL) 10 MG 24 hr tablet TAKE 1 TABLET(10 MG) BY MOUTH DAILY WITH BREAKFAST 90 tablet 1   metFORMIN  (GLUCOPHAGE ) 1000 MG tablet TAKE 1 TABLET(1000 MG) BY MOUTH TWICE DAILY WITH A MEAL 180 tablet 1   sildenafil  (VIAGRA ) 25 MG tablet Take 1 tablet (25 mg total) by mouth daily as needed for erectile dysfunction. 30 tablet 1   tirzepatide  (MOUNJARO ) 5 MG/0.5ML Pen Inject 5 mg into the skin once a week. 6 mL 1   No facility-administered medications prior to visit.    No Known Allergies  ROS Review of Systems  Constitutional:  Negative for chills and fever.  HENT:  Negative for congestion and sore throat.   Eyes:  Negative for pain and discharge.  Respiratory:  Negative for cough and shortness of breath.   Cardiovascular:  Negative for chest pain and palpitations.  Gastrointestinal:  Negative for constipation,  diarrhea, nausea and vomiting.  Endocrine: Negative for polydipsia and polyuria.  Genitourinary:  Negative for dysuria and hematuria.  Musculoskeletal:  Positive for arthralgias (R shoulder) and back pain. Negative for neck pain and neck stiffness.  Skin:  Negative for rash.  Neurological:  Negative for dizziness, weakness, numbness and headaches.  Psychiatric/Behavioral:  Positive for sleep disturbance. Negative for agitation and behavioral problems. The patient is nervous/anxious.       Objective:    Physical Exam Vitals reviewed.  Constitutional:      General: He is not in acute distress.    Appearance: He is obese. He is not diaphoretic.  HENT:     Head: Normocephalic and atraumatic.     Nose: Nose normal.     Mouth/Throat:     Mouth: Mucous membranes are moist.  Eyes:     General: No scleral icterus.    Extraocular Movements: Extraocular movements intact.  Cardiovascular:     Rate and Rhythm: Normal rate and regular rhythm.     Heart sounds: Normal heart sounds. No murmur heard. Pulmonary:     Breath sounds: Normal breath sounds. No wheezing or rales.  Abdominal:     Palpations: Abdomen is soft.     Tenderness: There is no abdominal tenderness.  Musculoskeletal:     Cervical back: Neck supple. No tenderness.     Right lower leg: No edema.     Left lower leg: No edema.  Skin:    General: Skin is warm.     Findings: No rash.  Neurological:     General: No focal deficit present.     Mental Status: He is alert and oriented to person, place, and time.     Sensory: No sensory deficit.     Gait: Gait abnormal (Slow).  Psychiatric:  Mood and Affect: Mood normal.        Behavior: Behavior normal.     BP 122/70   Pulse 83   Ht 5' 10 (1.778 m)   Wt 227 lb 6.4 oz (103.1 kg)   SpO2 96%   BMI 32.63 kg/m  Wt Readings from Last 3 Encounters:  12/28/23 227 lb 6.4 oz (103.1 kg)  08/24/23 237 lb 9.6 oz (107.8 kg)  04/22/23 253 lb 6.4 oz (114.9 kg)    Lab  Results  Component Value Date   TSH 0.943 08/24/2023   Lab Results  Component Value Date   WBC 9.8 08/24/2023   HGB 15.1 08/24/2023   HCT 45.4 08/24/2023   MCV 91 08/24/2023   PLT 248 08/24/2023   Lab Results  Component Value Date   NA 140 08/24/2023   K 4.3 08/24/2023   CO2 24 08/24/2023   GLUCOSE 114 (H) 08/24/2023   BUN 15 08/24/2023   CREATININE 1.08 08/24/2023   BILITOT 0.5 08/24/2023   ALKPHOS 80 08/24/2023   AST 28 08/24/2023   ALT 19 08/24/2023   PROT 7.4 08/24/2023   ALBUMIN 4.5 08/24/2023   CALCIUM  9.6 08/24/2023   ANIONGAP 7 07/21/2020   EGFR 76 08/24/2023   Lab Results  Component Value Date   CHOL 125 08/24/2023   Lab Results  Component Value Date   HDL 43 08/24/2023   Lab Results  Component Value Date   LDLCALC 66 08/24/2023   Lab Results  Component Value Date   TRIG 82 08/24/2023   Lab Results  Component Value Date   CHOLHDL 2.9 08/24/2023   Lab Results  Component Value Date   HGBA1C 7.6 (H) 08/24/2023      Assessment & Plan:   Problem List Items Addressed This Visit       Cardiovascular and Mediastinum   Essential hypertension - Primary   BP Readings from Last 1 Encounters:  12/28/23 122/70   Well-controlled with benazepril  40 mg daily and amlodipine  10 mg daily Counseled for compliance with the medications Advised DASH diet and moderate exercise/walking, at least 150 mins/week      Relevant Medications   sildenafil  (VIAGRA ) 50 MG tablet   amLODipine  (NORVASC ) 10 MG tablet   atorvastatin  (LIPITOR) 10 MG tablet   benazepril  (LOTENSIN ) 40 MG tablet     Endocrine   Type 2 diabetes mellitus with hyperglycemia, without long-term current use of insulin (HCC)   Lab Results  Component Value Date   HGBA1C 7.6 (H) 08/24/2023   Uncontrolled, but improved compared to prior On Metformin  1000 mg twice daily and Glipizide  10 mg once daily - refilled Did not tolerate Jardiance , had urinary frequency On Mounjaro  - increased dose to  7.5 mg qw in the last visit Advised to follow diabetic diet On statin and ACEi F/u CMP, HbA1c Diabetic eye exam: Advised to follow up with Ophthalmology for diabetic eye exam      Relevant Medications   atorvastatin  (LIPITOR) 10 MG tablet   benazepril  (LOTENSIN ) 40 MG tablet   glipiZIDE  (GLUCOTROL  XL) 10 MG 24 hr tablet   metFORMIN  (GLUCOPHAGE ) 1000 MG tablet   Other Relevant Orders   CMP14+EGFR   Hemoglobin A1c     Other   Mixed hyperlipidemia   Last lipid profile reviewed - LDL 66 Needs to take Lipitor regularly, refilled      Relevant Medications   sildenafil  (VIAGRA ) 50 MG tablet   amLODipine  (NORVASC ) 10 MG tablet   atorvastatin  (LIPITOR) 10  MG tablet   benazepril  (LOTENSIN ) 40 MG tablet   Erectile dysfunction   Increase dose of sildenafil  to 50 mg PRN as she did not get adequate response with 25 mg dose      Relevant Medications   sildenafil  (VIAGRA ) 50 MG tablet   Lumbar post-laminectomy syndrome   Has chronic low back pain On Norco and fentanyl patch, followed by [facility]      Elevated PSA   PSA: 10.1 in 08/25 Last PSA check in 2017 was 3.2 Currently asymptomatic Due to significantly elevated PSA, needs urgent urology evaluation - had referred in the last visit, but has not been able to schedule appointment due to phone tag between the office and him Recheck PSA      Relevant Orders   Ambulatory referral to Urology   PSA   Colon cancer screening   Discussed about colonoscopy and cologuard - benefits of each procedure discussed. Patient prefers Cologuard - ordered.      Relevant Orders   Cologuard   Other Visit Diagnoses       Encounter for immunization       Relevant Orders   Flu vaccine HIGH DOSE PF(Fluzone Trivalent) (Completed)            Meds ordered this encounter  Medications   sildenafil  (VIAGRA ) 50 MG tablet    Sig: Take 1 tablet (50 mg total) by mouth daily as needed for erectile dysfunction.    Dispense:  30  tablet    Refill:  1   amLODipine  (NORVASC ) 10 MG tablet    Sig: Take 1 tablet (10 mg total) by mouth daily.    Dispense:  90 tablet    Refill:  3   atorvastatin  (LIPITOR) 10 MG tablet    Sig: Take 1 tablet (10 mg total) by mouth daily at 6 PM.    Dispense:  90 tablet    Refill:  3   benazepril  (LOTENSIN ) 40 MG tablet    Sig: Take 1 tablet (40 mg total) by mouth daily.    Dispense:  90 tablet    Refill:  3   glipiZIDE  (GLUCOTROL  XL) 10 MG 24 hr tablet    Sig: Take 1 tablet (10 mg total) by mouth daily with breakfast.    Dispense:  90 tablet    Refill:  3   metFORMIN  (GLUCOPHAGE ) 1000 MG tablet    Sig: Take 1 tablet (1,000 mg total) by mouth 2 (two) times daily with a meal.    Dispense:  180 tablet    Refill:  3    Follow-up: Return in about 4 months (around 04/27/2024) for DM and HTN.    Suzzane MARLA Blanch, MD

## 2023-12-28 NOTE — Assessment & Plan Note (Signed)
 Has chronic low back pain On Norco and fentanyl patch, followed by [facility] pain clinic

## 2023-12-28 NOTE — Assessment & Plan Note (Signed)
 BP Readings from Last 1 Encounters:  12/28/23 122/70   Well-controlled with benazepril  40 mg daily and amlodipine  10 mg daily Counseled for compliance with the medications Advised DASH diet and moderate exercise/walking, at least 150 mins/week

## 2023-12-28 NOTE — Assessment & Plan Note (Signed)
 Discussed about colonoscopy and cologuard - benefits of each procedure discussed. Patient prefers Cologuard - ordered.

## 2023-12-30 DIAGNOSIS — E1165 Type 2 diabetes mellitus with hyperglycemia: Secondary | ICD-10-CM | POA: Diagnosis not present

## 2023-12-30 DIAGNOSIS — R972 Elevated prostate specific antigen [PSA]: Secondary | ICD-10-CM | POA: Diagnosis not present

## 2023-12-31 ENCOUNTER — Ambulatory Visit: Payer: Self-pay | Admitting: Internal Medicine

## 2023-12-31 LAB — CMP14+EGFR
ALT: 16 IU/L (ref 0–44)
AST: 29 IU/L (ref 0–40)
Albumin: 4 g/dL (ref 3.9–4.9)
Alkaline Phosphatase: 77 IU/L (ref 47–123)
BUN/Creatinine Ratio: 13 (ref 10–24)
BUN: 14 mg/dL (ref 8–27)
Bilirubin Total: 0.6 mg/dL (ref 0.0–1.2)
CO2: 24 mmol/L (ref 20–29)
Calcium: 9.3 mg/dL (ref 8.6–10.2)
Chloride: 99 mmol/L (ref 96–106)
Creatinine, Ser: 1.05 mg/dL (ref 0.76–1.27)
Globulin, Total: 2.9 g/dL (ref 1.5–4.5)
Glucose: 293 mg/dL — ABNORMAL HIGH (ref 70–99)
Potassium: 4.3 mmol/L (ref 3.5–5.2)
Sodium: 137 mmol/L (ref 134–144)
Total Protein: 6.9 g/dL (ref 6.0–8.5)
eGFR: 79 mL/min/1.73 (ref >59)

## 2023-12-31 LAB — HEMOGLOBIN A1C
Est. average glucose Bld gHb Est-mCnc: 140 mg/dL
Hgb A1c MFr Bld: 6.5 % — ABNORMAL HIGH (ref 4.8–5.6)

## 2023-12-31 LAB — PSA: Prostate Specific Ag, Serum: 8.4 ng/mL — ABNORMAL HIGH (ref 0.0–4.0)

## 2024-01-24 ENCOUNTER — Other Ambulatory Visit: Payer: Self-pay | Admitting: Internal Medicine

## 2024-01-28 ENCOUNTER — Other Ambulatory Visit: Payer: Self-pay | Admitting: "Endocrinology

## 2024-01-28 DIAGNOSIS — I10 Essential (primary) hypertension: Secondary | ICD-10-CM

## 2024-02-01 ENCOUNTER — Ambulatory Visit: Admitting: Orthopedic Surgery

## 2024-02-02 ENCOUNTER — Ambulatory Visit: Admitting: Orthopedic Surgery

## 2024-02-02 ENCOUNTER — Encounter: Payer: Self-pay | Admitting: Orthopedic Surgery

## 2024-02-02 ENCOUNTER — Telehealth: Payer: Self-pay | Admitting: Internal Medicine

## 2024-02-02 ENCOUNTER — Other Ambulatory Visit: Payer: Self-pay | Admitting: Internal Medicine

## 2024-02-02 DIAGNOSIS — G8929 Other chronic pain: Secondary | ICD-10-CM | POA: Diagnosis not present

## 2024-02-02 DIAGNOSIS — M25511 Pain in right shoulder: Secondary | ICD-10-CM | POA: Diagnosis not present

## 2024-02-02 DIAGNOSIS — E1165 Type 2 diabetes mellitus with hyperglycemia: Secondary | ICD-10-CM

## 2024-02-02 MED ORDER — LANCETS MISC: 1.0000 | 3 refills | Status: AC

## 2024-02-02 MED ORDER — TIRZEPATIDE 10 MG/0.5ML ~~LOC~~ SOAJ: 10.0000 mg | SUBCUTANEOUS | 1 refills | Status: AC

## 2024-02-02 MED ORDER — BLOOD GLUCOSE MONITORING SUPPL DEVI: 1.0000 | 0 refills | Status: AC

## 2024-02-02 MED ORDER — BLOOD GLUCOSE TEST VI STRP: 1.0000 | ORAL_STRIP | 3 refills | Status: AC

## 2024-02-02 NOTE — Progress Notes (Signed)
 Return Patient Visit  Assessment: Bryan Wright is a 69 y.o. male with the following: 1.  Chronic right shoulder pain   Plan: Bryan Wright has recurrence of pain in the right shoulder.  Pain is gradually return.  Most recent injection was very effective.  He has lasted for almost a month.  He is interested in another injection today.  Procedure note injection - Right shoulder    Verbal consent was obtained to inject the right shoulder, subacromial space Timeout was completed to confirm the site of injection.   The skin was prepped with alcohol and ethyl chloride was sprayed at the injection site.  A 21-gauge needle was used to inject 40 mg of Depo-Medrol  and 1% lidocaine  (4 cc) into the subacromial space of the right shoulder using a posterolateral approach.  There were no complications.  A sterile bandage was applied.    Follow-up: Return if symptoms worsen or fail to improve.  Subjective:  Chief Complaint  Patient presents with   Shoulder Pain    R would like another injection    History of Present Illness: Bryan Wright is a 69 y.o. male who returns to clinic for repeat evaluation of right shoulder pain.  I have seen him several times in clinic for his right shoulder.  He has received injections.  These been effective.  It has been almost a year since I last saw him.  Nothing is changed.  No recent injuries.  He reports that the pain is gradually returning.  Pain has not changed.   Review of Systems: No fevers or chills No numbness or tingling No chest pain No shortness of breath No bowel or bladder dysfunction No GI distress No headaches    Objective: There were no vitals taken for this visit.  Physical Exam:  General: Alert and oriented. and No acute distress. Gait: Normal gait.  Right shoulder without deformity.  Tenderness palpation over the anterior shoulder.  He has 170 degrees of forward flexion.   Internal rotation to his lumbar spine.  45  degrees of external rotation at his side.  Positive Jobe's.  Pain in the empty can testing position.  Positive O'Brien's.  Negative belly press.  Fingers are warm and well-perfused.  IMAGING: No new imaging obtained today    New Medications:  No orders of the defined types were placed in this encounter.     Oneil DELENA Horde, MD  02/02/2024 11:30 AM

## 2024-02-02 NOTE — Patient Instructions (Signed)

## 2024-02-02 NOTE — Telephone Encounter (Signed)
 Patient needing refill on tirzepatide  (MOUNJARO ) 7.5 MG/0.5ML Pen [[id number]] sent to [facility] in [place]. Supposed to have a dosage increase. Also says his insurance does not cover ONE TOUCH anymore and will need to have a new kit sent in.  Thank you

## 2024-02-03 NOTE — Telephone Encounter (Signed)
 Pt informed

## 2024-04-03 ENCOUNTER — Ambulatory Visit: Admitting: Urology

## 2024-04-10 ENCOUNTER — Ambulatory Visit

## 2024-04-27 ENCOUNTER — Ambulatory Visit: Admitting: Internal Medicine
